# Patient Record
Sex: Male | Born: 1955 | Race: Black or African American | Hispanic: No | Marital: Married | Smoking: Never smoker
Health system: Southern US, Community
[De-identification: ages and names within clinical notes are randomized; demographics above are authoritative.]

## PROBLEM LIST (undated history)

## (undated) DIAGNOSIS — N529 Male erectile dysfunction, unspecified: Secondary | ICD-10-CM

## (undated) DIAGNOSIS — N3281 Overactive bladder: Secondary | ICD-10-CM

## (undated) DIAGNOSIS — D649 Anemia, unspecified: Secondary | ICD-10-CM

## (undated) DIAGNOSIS — N400 Enlarged prostate without lower urinary tract symptoms: Secondary | ICD-10-CM

## (undated) DIAGNOSIS — R7303 Prediabetes: Secondary | ICD-10-CM

## (undated) DIAGNOSIS — I10 Essential (primary) hypertension: Secondary | ICD-10-CM

## (undated) DIAGNOSIS — G4730 Sleep apnea, unspecified: Secondary | ICD-10-CM

## (undated) DIAGNOSIS — E291 Testicular hypofunction: Secondary | ICD-10-CM

## (undated) DIAGNOSIS — R7309 Other abnormal glucose: Secondary | ICD-10-CM

## (undated) DIAGNOSIS — E119 Type 2 diabetes mellitus without complications: Secondary | ICD-10-CM

## (undated) DIAGNOSIS — E785 Hyperlipidemia, unspecified: Secondary | ICD-10-CM

## (undated) DIAGNOSIS — E559 Vitamin D deficiency, unspecified: Secondary | ICD-10-CM

## (undated) DIAGNOSIS — N183 Chronic kidney disease, stage 3 unspecified: Secondary | ICD-10-CM

## (undated) HISTORY — DX: Testicular hypofunction: E29.1

## (undated) HISTORY — DX: Chronic kidney disease, stage 3 unspecified: N18.30

## (undated) HISTORY — DX: Prediabetes: R73.03

## (undated) HISTORY — PX: POLYPECTOMY: SHX149

## (undated) HISTORY — DX: Hyperlipidemia, unspecified: E78.5

## (undated) HISTORY — DX: Anemia, unspecified: D64.9

## (undated) HISTORY — DX: Essential (primary) hypertension: I10

## (undated) HISTORY — PX: COLONOSCOPY: SHX174

## (undated) HISTORY — DX: Sleep apnea, unspecified: G47.30

## (undated) HISTORY — DX: Male erectile dysfunction, unspecified: N52.9

## (undated) HISTORY — DX: Type 2 diabetes mellitus without complications: E11.9

## (undated) HISTORY — PX: JOINT REPLACEMENT: SHX530

## (undated) HISTORY — DX: Vitamin D deficiency, unspecified: E55.9

## (undated) HISTORY — DX: Other abnormal glucose: R73.09

## (undated) HISTORY — DX: Overactive bladder: N32.81

---

## 2005-11-09 ENCOUNTER — Ambulatory Visit: Payer: Self-pay | Admitting: Gastroenterology

## 2005-11-26 ENCOUNTER — Encounter: Payer: Self-pay | Admitting: Gastroenterology

## 2005-11-26 ENCOUNTER — Ambulatory Visit: Payer: Self-pay | Admitting: Gastroenterology

## 2008-10-13 ENCOUNTER — Encounter (INDEPENDENT_AMBULATORY_CARE_PROVIDER_SITE_OTHER): Payer: Self-pay | Admitting: *Deleted

## 2008-11-22 ENCOUNTER — Ambulatory Visit: Payer: Self-pay | Admitting: Gastroenterology

## 2008-12-03 ENCOUNTER — Ambulatory Visit: Payer: Self-pay | Admitting: Gastroenterology

## 2009-02-15 ENCOUNTER — Ambulatory Visit (HOSPITAL_COMMUNITY): Admission: RE | Admit: 2009-02-15 | Discharge: 2009-02-15 | Payer: Self-pay | Admitting: Internal Medicine

## 2010-10-26 IMAGING — CR DG SHOULDER 2+V*L*
3 series · 3 of 3 positions shown · non-contrast
Comparison: None

CLINICAL DATA: Left shoulder pain

LEFT SHOULDER - 2+ VIEW

[view not recorded (1 of 3)]
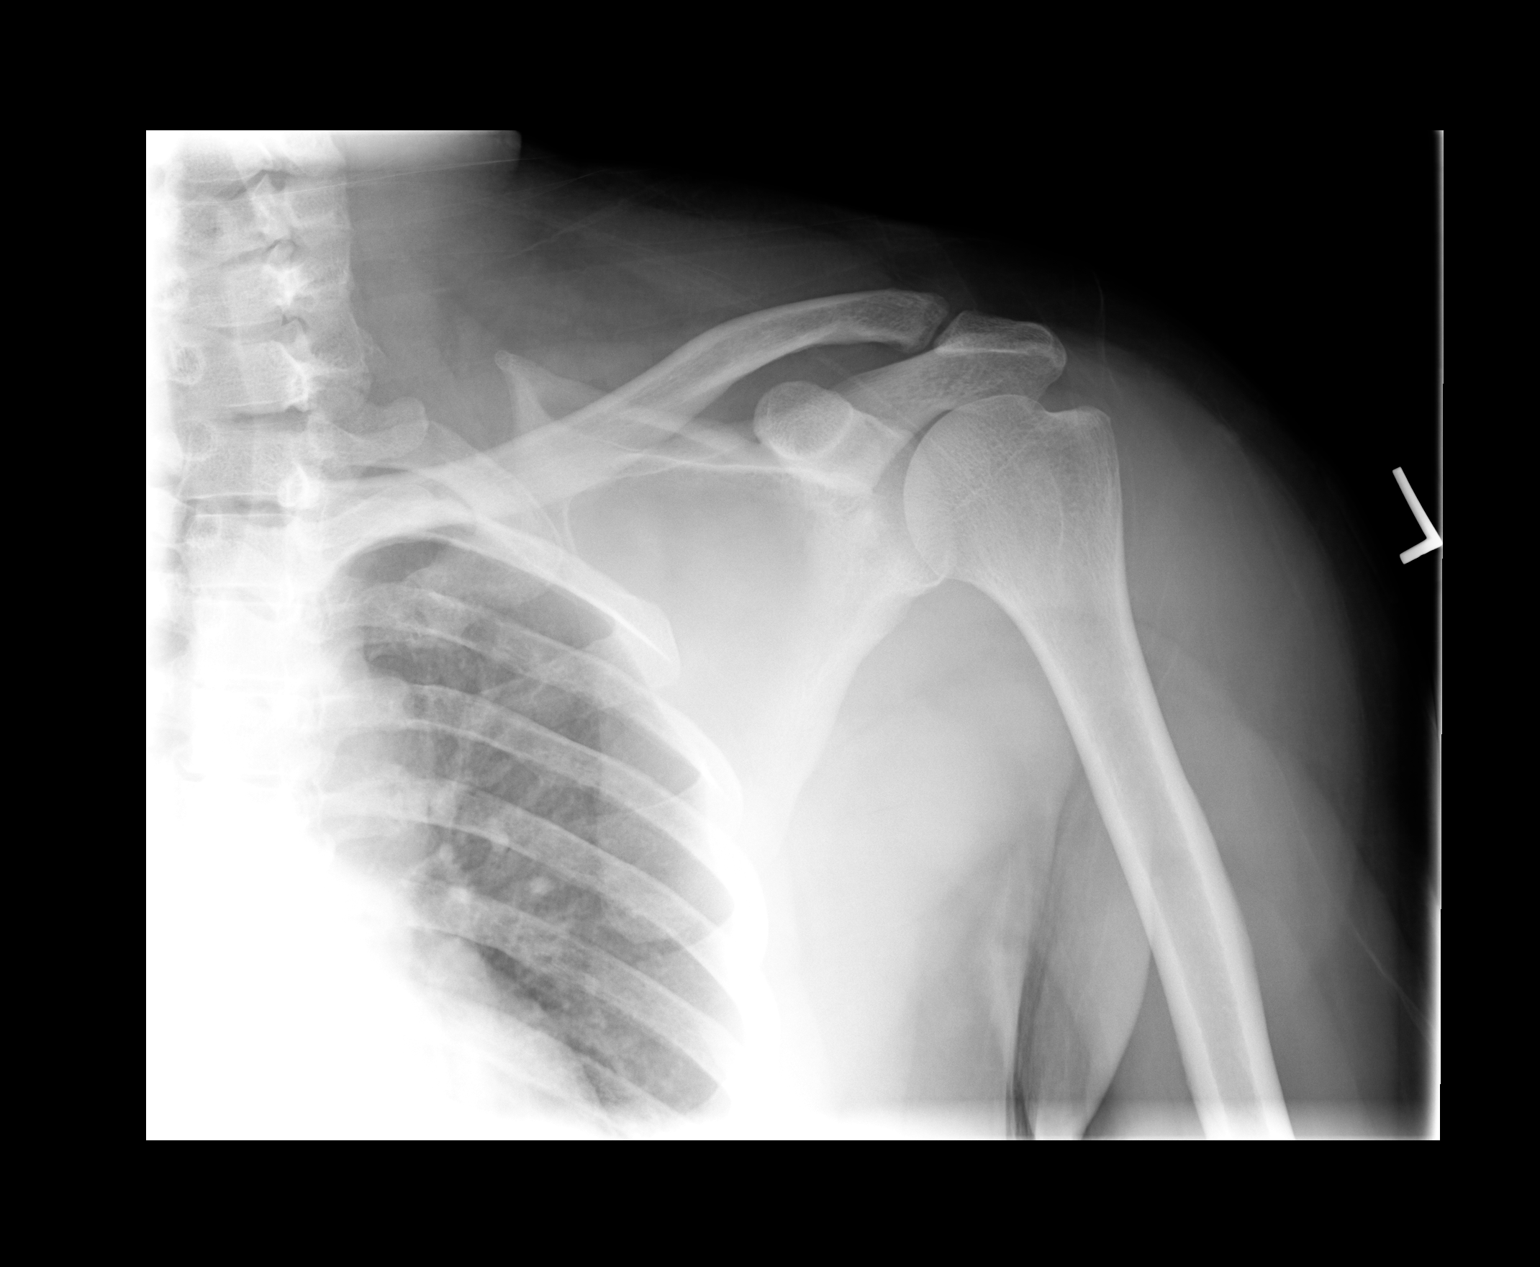

[view not recorded (2 of 3)]
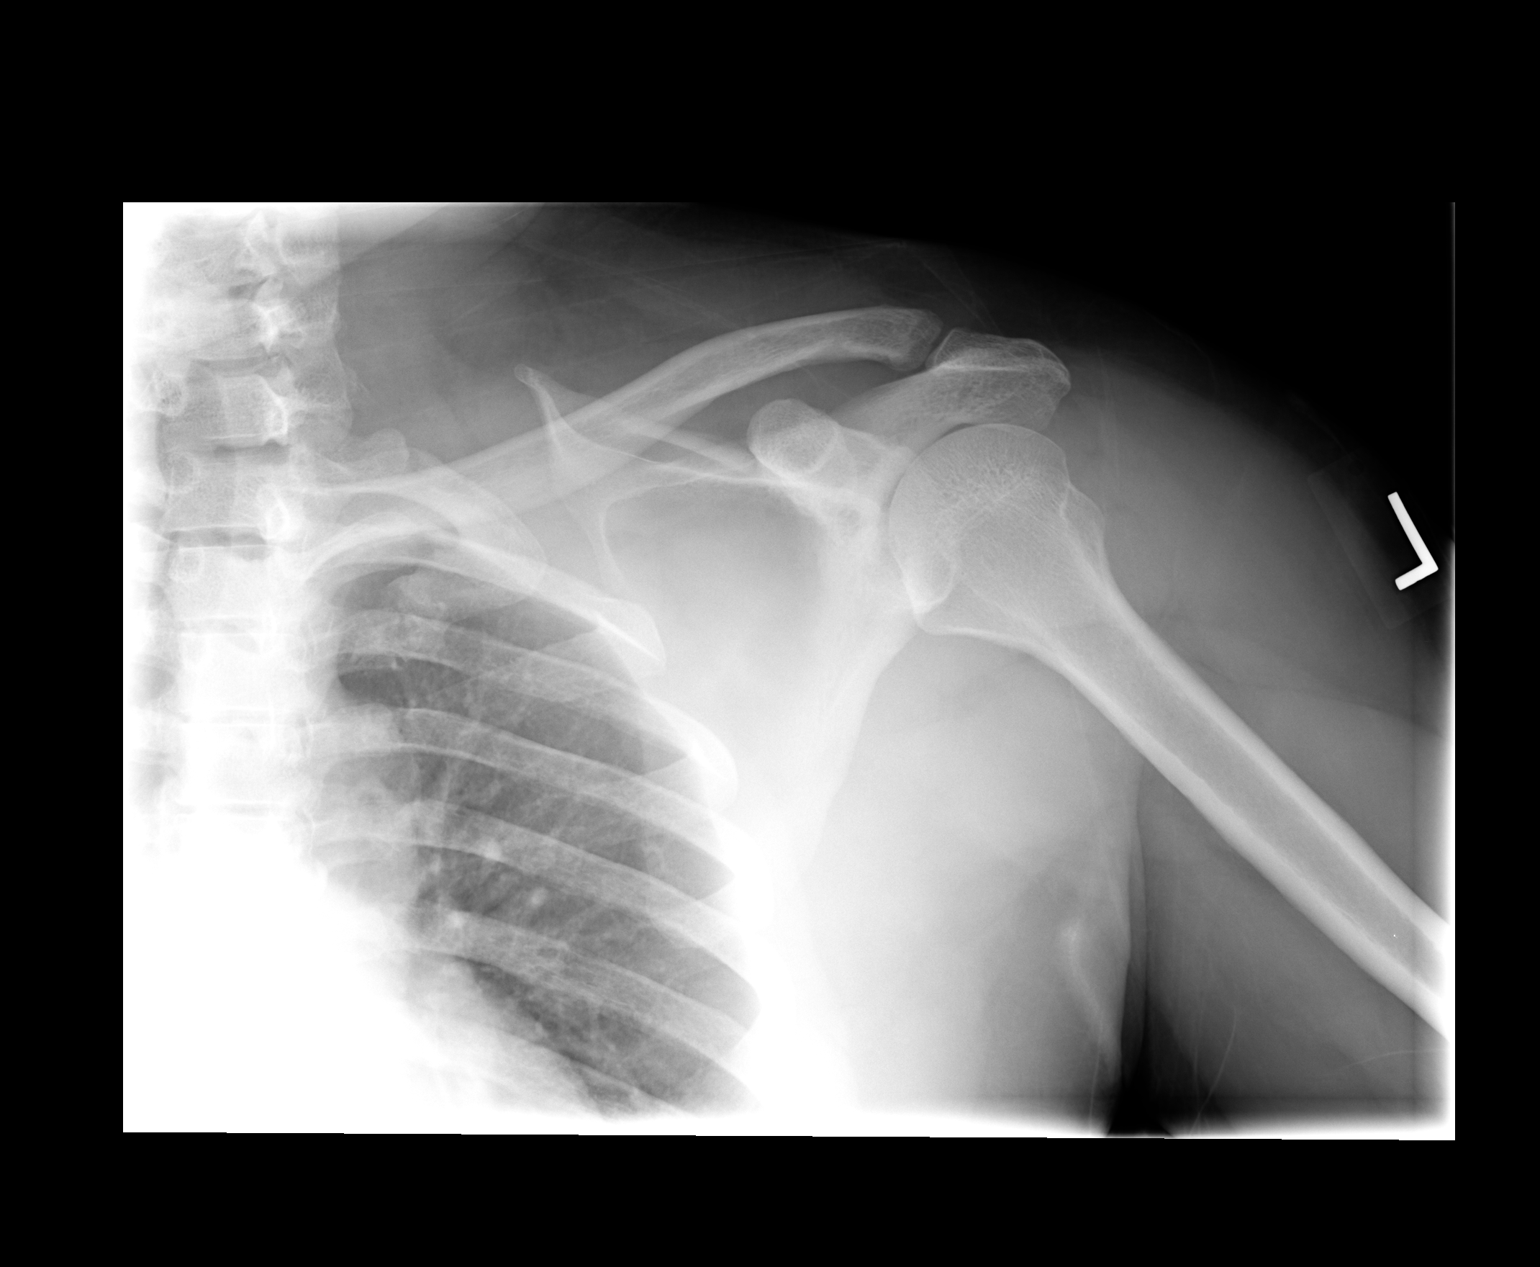

[view not recorded (3 of 3)]
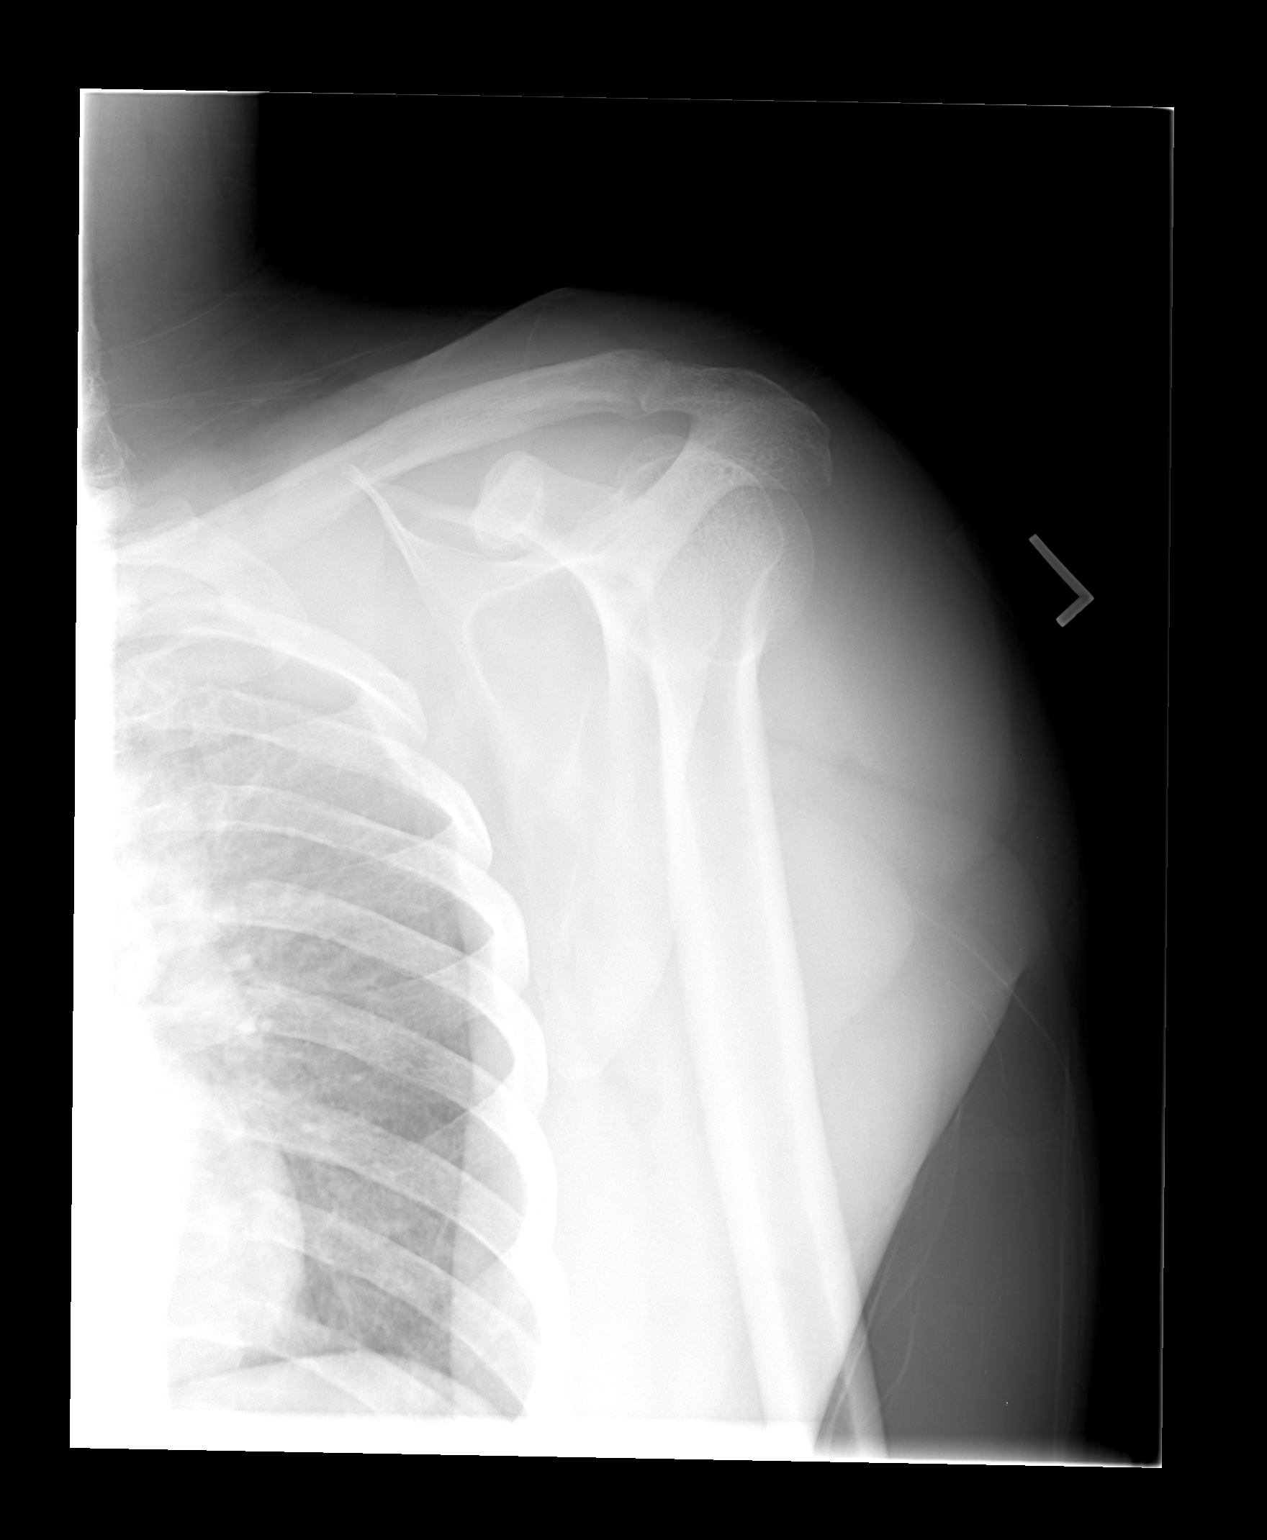

[3 of 3 positions shown; findings below may reference images not displayed]

FINDINGS: The AC joint is intact and the subacromial space is
maintained.  There is no evidence of fracture or dislocation.
IMPRESSION: Normal left shoulder.  If there is clinical concern regarding a
rotator cuff injury, MRI may be of help.

## 2013-02-12 ENCOUNTER — Encounter: Payer: Self-pay | Admitting: Internal Medicine

## 2013-02-12 DIAGNOSIS — E782 Mixed hyperlipidemia: Secondary | ICD-10-CM

## 2013-02-12 DIAGNOSIS — I10 Essential (primary) hypertension: Secondary | ICD-10-CM

## 2013-02-12 DIAGNOSIS — E1169 Type 2 diabetes mellitus with other specified complication: Secondary | ICD-10-CM | POA: Insufficient documentation

## 2013-02-13 ENCOUNTER — Ambulatory Visit: Payer: Self-pay | Admitting: Emergency Medicine

## 2013-02-16 ENCOUNTER — Other Ambulatory Visit: Payer: Self-pay | Admitting: Internal Medicine

## 2013-04-02 HISTORY — PX: COLONOSCOPY: SHX174

## 2013-05-06 ENCOUNTER — Encounter: Payer: Self-pay | Admitting: Internal Medicine

## 2013-05-06 ENCOUNTER — Ambulatory Visit (INDEPENDENT_AMBULATORY_CARE_PROVIDER_SITE_OTHER): Payer: 59 | Admitting: Internal Medicine

## 2013-05-06 VITALS — BP 142/88 | HR 76 | Temp 98.2°F | Resp 16 | Ht 68.75 in | Wt 247.4 lb

## 2013-05-06 DIAGNOSIS — Z79899 Other long term (current) drug therapy: Secondary | ICD-10-CM

## 2013-05-06 DIAGNOSIS — E782 Mixed hyperlipidemia: Secondary | ICD-10-CM

## 2013-05-06 DIAGNOSIS — Z113 Encounter for screening for infections with a predominantly sexual mode of transmission: Secondary | ICD-10-CM

## 2013-05-06 DIAGNOSIS — R7303 Prediabetes: Secondary | ICD-10-CM | POA: Insufficient documentation

## 2013-05-06 DIAGNOSIS — Z1212 Encounter for screening for malignant neoplasm of rectum: Secondary | ICD-10-CM

## 2013-05-06 DIAGNOSIS — R740 Nonspecific elevation of levels of transaminase and lactic acid dehydrogenase [LDH]: Secondary | ICD-10-CM

## 2013-05-06 DIAGNOSIS — Z125 Encounter for screening for malignant neoplasm of prostate: Secondary | ICD-10-CM

## 2013-05-06 DIAGNOSIS — Z0000 Encounter for general adult medical examination without abnormal findings: Secondary | ICD-10-CM

## 2013-05-06 DIAGNOSIS — R7309 Other abnormal glucose: Secondary | ICD-10-CM

## 2013-05-06 DIAGNOSIS — I10 Essential (primary) hypertension: Secondary | ICD-10-CM

## 2013-05-06 DIAGNOSIS — E559 Vitamin D deficiency, unspecified: Secondary | ICD-10-CM

## 2013-05-06 DIAGNOSIS — R7401 Elevation of levels of liver transaminase levels: Secondary | ICD-10-CM

## 2013-05-06 DIAGNOSIS — Z111 Encounter for screening for respiratory tuberculosis: Secondary | ICD-10-CM

## 2013-05-06 LAB — CBC WITH DIFFERENTIAL/PLATELET
BASOS PCT: 1 % (ref 0–1)
Basophils Absolute: 0 10*3/uL (ref 0.0–0.1)
Eosinophils Absolute: 0.2 10*3/uL (ref 0.0–0.7)
Eosinophils Relative: 2 % (ref 0–5)
HCT: 40.5 % (ref 39.0–52.0)
HEMOGLOBIN: 13.5 g/dL (ref 13.0–17.0)
LYMPHS ABS: 3 10*3/uL (ref 0.7–4.0)
LYMPHS PCT: 45 % (ref 12–46)
MCH: 29.2 pg (ref 26.0–34.0)
MCHC: 33.3 g/dL (ref 30.0–36.0)
MCV: 87.5 fL (ref 78.0–100.0)
MONOS PCT: 6 % (ref 3–12)
Monocytes Absolute: 0.4 10*3/uL (ref 0.1–1.0)
NEUTROS ABS: 3 10*3/uL (ref 1.7–7.7)
NEUTROS PCT: 46 % (ref 43–77)
Platelets: 205 10*3/uL (ref 150–400)
RBC: 4.63 MIL/uL (ref 4.22–5.81)
RDW: 14.5 % (ref 11.5–15.5)
WBC: 6.6 10*3/uL (ref 4.0–10.5)

## 2013-05-06 LAB — HEMOGLOBIN A1C
Hgb A1c MFr Bld: 6.4 % — ABNORMAL HIGH (ref <5.7)
Mean Plasma Glucose: 137 mg/dL — ABNORMAL HIGH (ref <117)

## 2013-05-06 MED ORDER — PHENTERMINE HCL 37.5 MG PO TABS: ORAL_TABLET | ORAL | Status: DC

## 2013-05-06 MED ORDER — HYDROCORTISONE 2.5 % RE CREA: TOPICAL_CREAM | RECTAL | Status: DC

## 2013-05-06 NOTE — Progress Notes (Signed)
Patient ID: Gabriel Livingston, male   DOB: Aug 14, 1955, 58 y.o.   MRN: [id number]  Annual Screening Comprehensive Examination  This very nice 58 y.o.  MBM presents for complete physical.  Patient has been followed for HTN, Prediabetes, Hyperlipidemia, and Vitamin D Deficiency.   HTN predates since 2005. Patient's BP has been controlled at home.Today's BP: 142/88 mmHg. Patient denies any cardiac symptoms as chest pain, palpitations, shortness of breath, dizziness or ankle swelling.   Patient's hyperlipidemia is controlled with diet and medications. Patient denies myalgias or other medication SE's. Last cholesterol last visit was 168, triglycerides 79, HDL 42 and LDL 110 - not at goal..     Patient has prediabetes with A1c 6.4% in Feb 2011  with last A1c  5.8% in Aug 2014. Patient denies reactive hypoglycemic symptoms, visual blurring, diabetic polys, or paresthesias.    Finally, patient has history of Vitamin D Deficiency of 19 in 2008 with last vitamin D 89 in Aug 2014       Medication List       atenolol 100 MG tablet  Commonly known as:  TENORMIN  Take 100 mg by mouth daily. 1/2 qd     hydrocortisone 2.5 % rectal cream  Commonly known as:  ANUSOL-HC  Apply rectally 2 to 4 x daily     multivitamin with minerals tablet  Take 1 tablet by mouth daily.     phentermine 37.5 MG tablet  Commonly known as:  ADIPEX-P  1/2 to 1 tablet every morning for weight loss     Vitamin D (Ergocalciferol) 50000 UNITS Caps capsule  Commonly known as:  DRISDOL  Take 50,000 Units by mouth. 6 days a week        No Known Allergies  Past Medical History  Diagnosis Date  . Hypertension   . Hyperlipidemia   . Other abnormal glucose   . Hypogonadism male   . Unspecified vitamin D deficiency     No past surgical history on file.  Family History  Problem Relation Age of Onset  . Heart disease Mother   . Hyperlipidemia Mother   . Hypertension Mother   . Cancer Father     lung, prostate     History   Social History  . Marital Status: Married    Spouse Name: N/A    Number of Children: N/A  . Years of Education: N/A   Occupational History  . Not on file.   Social History Main Topics  . Smoking status: Never Smoker   . Smokeless tobacco: Never Used  . Alcohol Use: No  . Drug Use: No  . Sexual Activity: Not on file   Other Topics Concern  . Not on file   Social History Narrative  . No narrative on file    ROS Constitutional: Denies fever, chills, weight loss/gain, headaches, insomnia, fatigue, night sweats, and change in appetite. Eyes: Denies redness, blurred vision, diplopia, discharge, itchy, watery eyes.  ENT: Denies discharge, congestion, post nasal drip, epistaxis, sore throat, earache, hearing loss, dental pain, Tinnitus, Vertigo, Sinus pain, snoring.  Cardio: Denies chest pain, palpitations, irregular heartbeat, syncope, dyspnea, diaphoresis, orthopnea, PND, claudication, edema Respiratory: denies cough, dyspnea, DOE, pleurisy, hoarseness, laryngitis, wheezing.  Gastrointestinal: Denies dysphagia, heartburn, reflux, water brash, pain, cramps, nausea, vomiting, bloating, diarrhea, constipation, hematemesis, melena, hematochezia, jaundice, hemorrhoids Genitourinary: Denies dysuria, frequency, urgency, nocturia, hesitancy, discharge, hematuria, flank pain Musculoskeletal: Denies arthralgia, myalgia, stiffness, Jt. Swelling, pain, limp, and strain/sprain. Skin: Denies puritis, rash, hives, warts, acne, eczema,  changing in skin lesion Neuro: No weakness, tremor, incoordination, spasms, paresthesia, pain Psychiatric: Denies confusion, memory loss, sensory loss Endocrine: Denies change in weight, skin, hair change, nocturia, and paresthesia, diabetic polys, visual blurring, hyper / hypo glycemic episodes.  Heme/Lymph: No excessive bleeding, bruising, or elarged lymph nodes.  BP: 142/88  Pulse: 76  Temp: 98.2 F (36.8 C)  Resp: 16    Estimated body mass  index is 36.81 kg/(m^2) as calculated from the following:   Height as of this encounter: 5' 8.75" (1.746 m).   Weight as of this encounter: 247 lb 6.4 oz (112.22 kg).  Physical Exam General Appearance: Well nourished, in no apparent distress. Eyes: PERRLA, EOMs, conjunctiva no swelling or erythema, normal fundi and vessels. Sinuses: No frontal/maxillary tenderness ENT/Mouth: EACs patent / TMs  nl. Nares clear without erythema, swelling, mucoid exudates. Oral hygiene is good. No erythema, swelling, or exudate. Tongue normal, non-obstructing. Tonsils not swollen or erythematous. Hearing normal.  Neck: Supple, thyroid normal. No bruits, nodes or JVD. Respiratory: Respiratory effort normal.  BS equal and clear bilateral without rales, rhonci, wheezing or stridor. Cardio: Heart sounds are normal with regular rate and rhythm and no murmurs, rubs or gallops. Peripheral pulses are normal and equal bilaterally without edema. No aortic or femoral bruits. Chest: symmetric with normal excursions and percussion.  Abdomen: Flat, soft, with bowl sounds. Nontender, no guarding, rebound, hernias, masses, or organomegaly.  Lymphatics: Non tender without lymphadenopathy.  Genitourinary: No hernias.Testes nl. DRE - prostate nl for age - smooth & firm w/o nodules. Musculoskeletal: Full ROM all peripheral extremities, joint stability, 5/5 strength, and normal gait. Skin: Warm and dry without rashes, lesions, cyanosis, clubbing or  ecchymosis.  Neuro: Cranial nerves intact, reflexes equal bilaterally. Normal muscle tone, no cerebellar symptoms. Sensation intact.  Pysch: Awake and oriented X 3, normal affect, insight and judgment appropriate.   Assessment and Plan  1. Annual Screening Examination 2. Hypertension  3. Hyperlipidemia 4. Pre Diabetes 5. Vitamin D Deficiency 6. Morbid obesity   Continue prudent diet as discussed, weight control, BP monitoring, regular exercise, and medications as discussed.   Discussed med effects and SE's. Routine screening labs and tests as requested with regular follow-up as recommended.

## 2013-05-06 NOTE — Patient Instructions (Addendum)
Hemorrhoids Hemorrhoids are swollen veins around the rectum or anus. There are two types of hemorrhoids:   Internal hemorrhoids. These occur in the veins just inside the rectum. They may poke through to the outside and become irritated and painful.  External hemorrhoids. These occur in the veins outside the anus and can be felt as a painful swelling or hard lump near the anus. CAUSES  Pregnancy.   Obesity.   Constipation or diarrhea.   Straining to have a bowel movement.   Sitting for long periods on the toilet.  Heavy lifting or other activity that caused you to strain.  Anal intercourse. SYMPTOMS   Pain.   Anal itching or irritation.   Rectal bleeding.   Fecal leakage.   Anal swelling.   One or more lumps around the anus.  DIAGNOSIS  Your caregiver may be able to diagnose hemorrhoids by visual examination. Other examinations or tests that may be performed include:   Examination of the rectal area with a gloved hand (digital rectal exam).   Examination of anal canal using a small tube (scope).   A blood test if you have lost a significant amount of blood.  A test to look inside the colon (sigmoidoscopy or colonoscopy). TREATMENT Most hemorrhoids can be treated at home. However, if symptoms do not seem to be getting better or if you have a lot of rectal bleeding, your caregiver may perform a procedure to help make the hemorrhoids get smaller or remove them completely. Possible treatments include:   Placing a rubber band at the base of the hemorrhoid to cut off the circulation (rubber band ligation).   Injecting a chemical to shrink the hemorrhoid (sclerotherapy).   Using a tool to burn the hemorrhoid (infrared light therapy).   Surgically removing the hemorrhoid (hemorrhoidectomy).   Stapling the hemorrhoid to block blood flow to the tissue (hemorrhoid stapling).  HOME CARE INSTRUCTIONS   Eat foods with fiber, such as whole grains, beans,  nuts, fruits, and vegetables. Ask your doctor about taking products with added fiber in them (fibersupplements).  Increase fluid intake. Drink enough water and fluids to keep your urine clear or pale yellow.   Exercise regularly.   Go to the bathroom when you have the urge to have a bowel movement. Do not wait.   Avoid straining to have bowel movements.   Keep the anal area dry and clean. Use wet toilet paper or moist towelettes after a bowel movement.   Medicated creams and suppositories may be used or applied as directed.   Only take over-the-counter or prescription medicines as directed by your caregiver.   Take warm sitz baths for 15 20 minutes, 3 4 times a day to ease pain and discomfort.   Place ice packs on the hemorrhoids if they are tender and swollen. Using ice packs between sitz baths may be helpful.   Put ice in a plastic bag.   Place a towel between your skin and the bag.   Leave the ice on for 15 20 minutes, 3 4 times a day.   Do not use a donut-shaped pillow or sit on the toilet for long periods. This increases blood pooling and pain.  SEEK MEDICAL CARE IF:  You have increasing pain and swelling that is not controlled by treatment or medicine.  You have uncontrolled bleeding.  You have difficulty or you are unable to have a bowel movement.  You have pain or inflammation outside the area of the hemorrhoids. MAKE SURE YOU:  Understand these instructions.  Will watch your condition.  Will get help right away if you are not doing well or get worse. Document Released: 03/16/2000 Document Revised: 03/05/2012 Document Reviewed: 01/22/2012 [facility] Patient Information 2014 [place].   Hemorrhoids Hemorrhoids are puffy (swollen) veins around the rectum or anus. Hemorrhoids can cause pain, itching, bleeding, or irritation. HOME CARE  Eat foods with fiber, such as whole grains, beans, nuts, fruits, and vegetables. Ask your doctor about  taking products with added fiber in them (fibersupplements).  Drink enough fluid to keep your pee (urine) clear or pale yellow.  Exercise often.  Go to the bathroom when you have the urge to poop. Do not wait.  Avoid straining to poop (bowel movement).  Keep the butt area dry and clean. Use wet toilet paper or moist paper towels.  Medicated creams and medicine inserted into the anus (anal suppository) may be used or applied as told.  Only take medicine as told by your doctor.  Take a warm water bath (sitz bath) for 15 20 minutes to ease pain. Do this 3 4 times a day.  Place ice packs on the area if it is tender or puffy. Use the ice packs between the warm water baths.  Put ice in a plastic bag.  Place a towel between your skin and the bag.  Leave the ice on for 15-20 minutes, 03-04 times a day.  Do not use a donut-shaped pillow or sit on the toilet for a long time. GET HELP RIGHT AWAY IF:   You have more pain that is not controlled by treatment or medicine.  You have bleeding that will not stop.  You have trouble or are unable to poop (bowel movement).  You have pain or puffiness outside the area of the hemorrhoids. MAKE SURE YOU:   Understand these instructions.  Will watch your condition.  Will get help right away if you are not doing well or get worse. Document Released: 12/27/2007 Document Revised: 03/05/2012 Document Reviewed: 01/29/2012 [facility] Patient Information 2014 [place], [place].  Hypertension As your heart beats, it forces blood through your arteries. This force is your blood pressure. If the pressure is too high, it is called hypertension (HTN) or high blood pressure. HTN is dangerous because you may have it and not know it. High blood pressure may mean that your heart has to work harder to pump blood. Your arteries may be narrow or stiff. The extra work puts you at risk for heart disease, stroke, and other problems.  Blood pressure consists of two  numbers, a higher number over a lower, 110/72, for example. It is stated as "110 over 72." The ideal is below 120 for the top number (systolic) and under 80 for the bottom (diastolic). Write down your blood pressure today. You should pay close attention to your blood pressure if you have certain conditions such as:  Heart failure.  Prior heart attack.  Diabetes  Chronic kidney disease.  Prior stroke.  Multiple risk factors for heart disease. To see if you have HTN, your blood pressure should be measured while you are seated with your arm held at the level of the heart. It should be measured at least twice. A one-time elevated blood pressure reading (especially in the Emergency Department) does not mean that you need treatment. There may be conditions in which the blood pressure is different between your right and left arms. It is important to see your caregiver soon for a recheck. Most people have  essential hypertension which means that there is not a specific cause. This type of high blood pressure may be lowered by changing lifestyle factors such as:  Stress.  Smoking.  Lack of exercise.  Excessive weight.  Drug/tobacco/alcohol use.  Eating less salt. Most people do not have symptoms from high blood pressure until it has caused damage to the body. Effective treatment can often prevent, delay or reduce that damage. TREATMENT  When a cause has been identified, treatment for high blood pressure is directed at the cause. There are a large number of medications to treat HTN. These fall into several categories, and your caregiver will help you select the medicines that are best for you. Medications may have side effects. You should review side effects with your caregiver. If your blood pressure stays high after you have made lifestyle changes or started on medicines,   Your medication(s) may need to be changed.  Other problems may need to be addressed.  Be certain you understand your  prescriptions, and know how and when to take your medicine.  Be sure to follow up with your caregiver within the time frame advised (usually within two weeks) to have your blood pressure rechecked and to review your medications.  If you are taking more than one medicine to lower your blood pressure, make sure you know how and at what times they should be taken. Taking two medicines at the same time can result in blood pressure that is too low. SEEK IMMEDIATE MEDICAL CARE IF:  You develop a severe headache, blurred or changing vision, or confusion.  You have unusual weakness or numbness, or a faint feeling.  You have severe chest or abdominal pain, vomiting, or breathing problems. MAKE SURE YOU:   Understand these instructions.  Will watch your condition.  Will get help right away if you are not doing well or get worse.   Diabetes and Exercise Exercising regularly is important. It is not just about losing weight. It has many health benefits, such as:  Improving your overall fitness, flexibility, and endurance.  Increasing your bone density.  Helping with weight control.  Decreasing your body fat.  Increasing your muscle strength.  Reducing stress and tension.  Improving your overall health. People with diabetes who exercise gain additional benefits because exercise:  Reduces appetite.  Improves the body's use of blood sugar (glucose).  Helps lower or control blood glucose.  Decreases blood pressure.  Helps control blood lipids (such as cholesterol and triglycerides).  Improves the body's use of the hormone insulin by:  Increasing the body's insulin sensitivity.  Reducing the body's insulin needs.  Decreases the risk for heart disease because exercising:  Lowers cholesterol and triglycerides levels.  Increases the levels of good cholesterol (such as high-density lipoproteins [HDL]) in the body.  Lowers blood glucose levels. YOUR ACTIVITY PLAN  Choose an  activity that you enjoy and set realistic goals. Your health care provider or diabetes educator can help you make an activity plan that works for you. You can break activities into 2 or 3 sessions throughout the day. Doing so is as good as one long session. Exercise ideas include:  Taking the dog for a walk.  Taking the stairs instead of the elevator.  Dancing to your favorite song.  Doing your favorite exercise with a friend. RECOMMENDATIONS FOR EXERCISING WITH TYPE 1 OR TYPE 2 DIABETES   Check your blood glucose before exercising. If blood glucose levels are greater than 240 mg/dL, check for urine ketones.  Do not exercise if ketones are present.  Avoid injecting insulin into areas of the body that are going to be exercised. For example, avoid injecting insulin into:  The arms when playing tennis.  The legs when jogging.  Keep a record of:  Food intake before and after you exercise.  Expected peak times of insulin action.  Blood glucose levels before and after you exercise.  The type and amount of exercise you have done.  Review your records with your health care provider. Your health care provider will help you to develop guidelines for adjusting food intake and insulin amounts before and after exercising.  If you take insulin or oral hypoglycemic agents, watch for signs and symptoms of hypoglycemia. They include:  Dizziness.  Shaking.  Sweating.  Chills.  Confusion.  Drink plenty of water while you exercise to prevent dehydration or heat stroke. Body water is lost during exercise and must be replaced.  Talk to your health care provider before starting an exercise program to make sure it is safe for you. Remember, almost any type of activity is better than none.    Cholesterol Cholesterol is a white, waxy, fat-like protein needed by your body in small amounts. The liver makes all the cholesterol you need. It is carried from the liver by the blood through the blood  vessels. Deposits (plaque) may build up on blood vessel walls. This makes the arteries narrower and stiffer. Plaque increases the risk for heart attack and stroke. You cannot feel your cholesterol level even if it is very high. The only way to know is by a blood test to check your lipid (fats) levels. Once you know your cholesterol levels, you should keep a record of the test results. Work with your caregiver to to keep your levels in the desired range. WHAT THE RESULTS MEAN:  Total cholesterol is a rough measure of all the cholesterol in your blood.  LDL is the so-called bad cholesterol. This is the type that deposits cholesterol in the walls of the arteries. You want this level to be low.  HDL is the good cholesterol because it cleans the arteries and carries the LDL away. You want this level to be high.  Triglycerides are fat that the body can either burn for energy or store. High levels are closely linked to heart disease. DESIRED LEVELS:  Total cholesterol below 200.  LDL below 100 for people at risk, below 70 for very high risk.  HDL above 50 is good, above 60 is best.  Triglycerides below 150. HOW TO LOWER YOUR CHOLESTEROL:  Diet.  Choose fish or white meat chicken and [place], roasted or baked. Limit fatty cuts of red meat, fried foods, and processed meats, such as sausage and lunch meat.  Eat lots of fresh fruits and vegetables. Choose whole grains, beans, pasta, potatoes and cereals.  Use only small amounts of olive, corn or canola oils. Avoid butter, mayonnaise, shortening or palm kernel oils. Avoid foods with trans-fats.  Use skim/nonfat milk and low-fat/nonfat yogurt and cheeses. Avoid whole milk, cream, ice cream, egg yolks and cheeses. Healthy desserts include angel food cake, ginger snaps, animal crackers, hard candy, popsicles, and low-fat/nonfat frozen yogurt. Avoid pastries, cakes, pies and cookies.  Exercise.  A regular program helps decrease LDL and raises  HDL.  Helps with weight control.  Do things that increase your activity level like gardening, walking, or taking the stairs.  Medication.  May be prescribed by your caregiver to help lowering cholesterol and  the risk for heart disease.  You may need medicine even if your levels are normal if you have several risk factors. HOME CARE INSTRUCTIONS   Follow your diet and exercise programs as suggested by your caregiver.  Take medications as directed.  Have blood work done when your caregiver feels it is necessary. MAKE SURE YOU:   Understand these instructions.  Will watch your condition.  Will get help right away if you are not doing well or get worse.      Vitamin D Deficiency Vitamin D is an important vitamin that your body needs. Having too little of it in your body is called a deficiency. A very bad deficiency can make your bones soft and can cause a condition called rickets.  Vitamin D is important to your body for different reasons, such as:   It helps your body absorb 2 minerals called calcium and phosphorus.  It helps make your bones healthy.  It may prevent some diseases, such as diabetes and multiple sclerosis.  It helps your muscles and heart. You can get vitamin D in several ways. It is a natural part of some foods. The vitamin is also added to some dairy products and cereals. Some people take vitamin D supplements. Also, your body makes vitamin D when you are in the sun. It changes the sun's rays into a form of the vitamin that your body can use. CAUSES   Not eating enough foods that contain vitamin D.  Not getting enough sunlight.  Having certain digestive system diseases that make it hard to absorb vitamin D. These diseases include Crohn's disease, chronic pancreatitis, and cystic fibrosis.  Having a surgery in which part of the stomach or small intestine is removed.  Being obese. Fat cells pull vitamin D out of your blood. That means that obese people  may not have enough vitamin D left in their blood and in other body tissues.  Having chronic kidney or liver disease. RISK FACTORS Risk factors are things that make you more likely to develop a vitamin D deficiency. They include:  Being older.  Not being able to get outside very much.  Living in a nursing home.  Having had broken bones.  Having weak or thin bones (osteoporosis).  Having a disease or condition that changes how your body absorbs vitamin D.  Having dark skin.  Some medicines such as seizure medicines or steroids.  Being overweight or obese. SYMPTOMS Mild cases of vitamin D deficiency may not have any symptoms. If you have a very bad case, symptoms may include:  Bone pain.  Muscle pain.  Falling often.  Broken bones caused by a minor injury, due to osteoporosis. DIAGNOSIS A blood test is the best way to tell if you have a vitamin D deficiency. TREATMENT Vitamin D deficiency can be treated in different ways. Treatment for vitamin D deficiency depends on what is causing it. Options include:  Taking vitamin D supplements.  Taking a calcium supplement. Your caregiver will suggest what dose is best for you. HOME CARE INSTRUCTIONS  Take any supplements that your caregiver prescribes. Follow the directions carefully. Take only the suggested amount.  Have your blood tested 2 months after you start taking supplements.  Eat foods that contain vitamin D. Healthy choices include:  Fortified dairy products, cereals, or juices. Fortified means vitamin D has been added to the food. Check the label on the package to be sure.  Fatty fish like salmon or trout.  Eggs.  Oysters.  Do not use a tanning bed.  Keep your weight at a healthy level. Lose weight if you need to.  Keep all follow-up appointments. Your caregiver will need to perform blood tests to make sure your vitamin D deficiency is going away. SEEK MEDICAL CARE IF:  You have any questions about your  treatment.  You continue to have symptoms of vitamin D deficiency.  You have nausea or vomiting.  You are constipated.  You feel confused.  You have severe abdominal or back pain. MAKE SURE YOU:  Understand these instructions.  Will watch your condition.  Will get help right away if you are not doing well or get worse.

## 2013-05-07 LAB — INSULIN, FASTING: Insulin fasting, serum: 59 u[IU]/mL — ABNORMAL HIGH (ref 3–28)

## 2013-05-07 LAB — LIPID PANEL
Cholesterol: 152 mg/dL (ref 0–200)
HDL: 40 mg/dL (ref >39)
LDL CALC: 91 mg/dL (ref 0–99)
TRIGLYCERIDES: 103 mg/dL (ref <150)
Total CHOL/HDL Ratio: 3.8 Ratio
VLDL: 21 mg/dL (ref 0–40)

## 2013-05-07 LAB — HEPATITIS A ANTIBODY, TOTAL: Hep A Total Ab: REACTIVE — AB

## 2013-05-07 LAB — HEPATIC FUNCTION PANEL
ALBUMIN: 4 g/dL (ref 3.5–5.2)
ALT: 18 U/L (ref 0–53)
AST: 22 U/L (ref 0–37)
Alkaline Phosphatase: 49 U/L (ref 39–117)
Bilirubin, Direct: 0.1 mg/dL (ref 0.0–0.3)
Indirect Bilirubin: 0.5 mg/dL (ref 0.2–1.2)
TOTAL PROTEIN: 7.3 g/dL (ref 6.0–8.3)
Total Bilirubin: 0.6 mg/dL (ref 0.2–1.2)

## 2013-05-07 LAB — MICROALBUMIN / CREATININE URINE RATIO
CREATININE, URINE: 241.4 mg/dL
MICROALB/CREAT RATIO: 3.3 mg/g (ref 0.0–30.0)
Microalb, Ur: 0.79 mg/dL (ref 0.00–1.89)

## 2013-05-07 LAB — URINALYSIS, MICROSCOPIC ONLY
BACTERIA UA: NONE SEEN
CASTS: NONE SEEN
CRYSTALS: NONE SEEN
Squamous Epithelial / LPF: NONE SEEN

## 2013-05-07 LAB — BASIC METABOLIC PANEL WITH GFR
BUN: 17 mg/dL (ref 6–23)
CHLORIDE: 103 meq/L (ref 96–112)
CO2: 27 mEq/L (ref 19–32)
Calcium: 9.6 mg/dL (ref 8.4–10.5)
Creat: 1.24 mg/dL (ref 0.50–1.35)
GFR, EST AFRICAN AMERICAN: 74 mL/min
GFR, EST NON AFRICAN AMERICAN: 64 mL/min
Glucose, Bld: 93 mg/dL (ref 70–99)
POTASSIUM: 3.9 meq/L (ref 3.5–5.3)
SODIUM: 140 meq/L (ref 135–145)

## 2013-05-07 LAB — VITAMIN D 25 HYDROXY (VIT D DEFICIENCY, FRACTURES): Vit D, 25-Hydroxy: 87 ng/mL (ref 30–89)

## 2013-05-07 LAB — HEPATITIS B E ANTIBODY: HEPATITIS BE ANTIBODY: NEGATIVE

## 2013-05-07 LAB — HEPATITIS B SURFACE ANTIBODY,QUALITATIVE: HEP B S AB: NEGATIVE

## 2013-05-07 LAB — VITAMIN B12: Vitamin B-12: 697 pg/mL (ref 211–911)

## 2013-05-07 LAB — PSA: PSA: 1.56 ng/mL (ref <=4.00)

## 2013-05-07 LAB — HEPATITIS C ANTIBODY: HCV Ab: NEGATIVE

## 2013-05-07 LAB — TESTOSTERONE: TESTOSTERONE: 211 ng/dL — AB (ref 300–890)

## 2013-05-07 LAB — MAGNESIUM: MAGNESIUM: 1.8 mg/dL (ref 1.5–2.5)

## 2013-05-07 LAB — HIV ANTIBODY (ROUTINE TESTING W REFLEX): HIV: NONREACTIVE

## 2013-05-07 LAB — RPR

## 2013-05-07 LAB — TSH: TSH: 3.011 u[IU]/mL (ref 0.350–4.500)

## 2013-05-07 LAB — HEPATITIS B CORE ANTIBODY, TOTAL: Hep B Core Total Ab: NONREACTIVE

## 2013-05-08 LAB — TB SKIN TEST
INDURATION: 0 mm
TB SKIN TEST: NEGATIVE

## 2013-05-21 ENCOUNTER — Other Ambulatory Visit (INDEPENDENT_AMBULATORY_CARE_PROVIDER_SITE_OTHER): Payer: 59

## 2013-05-21 DIAGNOSIS — Z1212 Encounter for screening for malignant neoplasm of rectum: Secondary | ICD-10-CM

## 2013-05-21 LAB — POC HEMOCCULT BLD/STL (HOME/3-CARD/SCREEN)
Card #2 Fecal Occult Blod, POC: NEGATIVE
Card #3 Fecal Occult Blood, POC: NEGATIVE
Fecal Occult Blood, POC: NEGATIVE

## 2013-08-06 ENCOUNTER — Ambulatory Visit: Payer: Self-pay | Admitting: Physician Assistant

## 2013-09-17 ENCOUNTER — Encounter: Payer: Self-pay | Admitting: Gastroenterology

## 2013-10-30 ENCOUNTER — Encounter: Payer: Self-pay | Admitting: Gastroenterology

## 2013-11-05 ENCOUNTER — Encounter: Payer: Self-pay | Admitting: Internal Medicine

## 2013-11-05 ENCOUNTER — Ambulatory Visit (INDEPENDENT_AMBULATORY_CARE_PROVIDER_SITE_OTHER): Payer: 59 | Admitting: Internal Medicine

## 2013-11-05 VITALS — BP 114/82 | HR 76 | Temp 97.7°F | Resp 16 | Ht 69.0 in | Wt 223.8 lb

## 2013-11-05 DIAGNOSIS — E559 Vitamin D deficiency, unspecified: Secondary | ICD-10-CM

## 2013-11-05 DIAGNOSIS — R7309 Other abnormal glucose: Secondary | ICD-10-CM

## 2013-11-05 DIAGNOSIS — D485 Neoplasm of uncertain behavior of skin: Secondary | ICD-10-CM

## 2013-11-05 DIAGNOSIS — H6120 Impacted cerumen, unspecified ear: Secondary | ICD-10-CM

## 2013-11-05 DIAGNOSIS — I10 Essential (primary) hypertension: Secondary | ICD-10-CM

## 2013-11-05 DIAGNOSIS — Z79899 Other long term (current) drug therapy: Secondary | ICD-10-CM

## 2013-11-05 DIAGNOSIS — H6123 Impacted cerumen, bilateral: Secondary | ICD-10-CM

## 2013-11-05 DIAGNOSIS — E782 Mixed hyperlipidemia: Secondary | ICD-10-CM

## 2013-11-05 LAB — CBC WITH DIFFERENTIAL/PLATELET
Basophils Absolute: 0.1 10*3/uL (ref 0.0–0.1)
Basophils Relative: 1 % (ref 0–1)
EOS PCT: 5 % (ref 0–5)
Eosinophils Absolute: 0.3 10*3/uL (ref 0.0–0.7)
HEMATOCRIT: 41.3 % (ref 39.0–52.0)
Hemoglobin: 14 g/dL (ref 13.0–17.0)
LYMPHS ABS: 2.7 10*3/uL (ref 0.7–4.0)
LYMPHS PCT: 45 % (ref 12–46)
MCH: 28.9 pg (ref 26.0–34.0)
MCHC: 33.9 g/dL (ref 30.0–36.0)
MCV: 85.2 fL (ref 78.0–100.0)
MONO ABS: 0.4 10*3/uL (ref 0.1–1.0)
Monocytes Relative: 7 % (ref 3–12)
Neutro Abs: 2.6 10*3/uL (ref 1.7–7.7)
Neutrophils Relative %: 42 % — ABNORMAL LOW (ref 43–77)
Platelets: 217 10*3/uL (ref 150–400)
RBC: 4.85 MIL/uL (ref 4.22–5.81)
RDW: 14.7 % (ref 11.5–15.5)
WBC: 6.1 10*3/uL (ref 4.0–10.5)

## 2013-11-05 NOTE — Progress Notes (Signed)
Patient ID: Gabriel Livingston, male   DOB: 1955/05/02, 58 y.o.   MRN: [id number]   This very nice 58 y.o.MBM presents for 3 month follow up with Hypertension, Hyperlipidemia, Pre-Diabetes and Vitamin D Deficiency.    Patient is treated for HTN & BP has been controlled at home. Today's BP: 114/82 mmHg. Patient denies any cardiac type chest pain, palpitations, dyspnea/orthopnea/PND, dizziness, claudication, or dependent edema.   Hyperlipidemia is controlled with diet & meds. Patient denies myalgias or other med SE's. Last Lipids were 05/06/2013: Cholesterol, Total 152; HDL Cholesterol by NMR 40; LDL (calc) 91; Triglycerides 103   Also, the patient has history of PreDiabetes and patient denies any symptoms of reactive hypoglycemia, diabetic polys, paresthesias or visual blurring.  Last A1c was 05/06/2013: Hemoglobin-A1c 6.4*    Further, Patient has history of Vitamin D Deficiency and patient supplements vitamin D without any suspected side-effects. Last vitamin D was 05/06/2013: Vit D, 25-Hydroxy 87   Medication List   atenolol 100 MG tablet  Commonly known as:  TENORMIN  Take 100 mg by mouth daily. 1/2 qd     hydrocortisone 2.5 % rectal cream  Commonly known as:  ANUSOL-HC  Apply rectally 2 to 4 x daily     multivitamin with minerals tablet  Take 1 tablet by mouth daily.     phentermine 37.5 MG tablet  Commonly known as:  ADIPEX-P  1/2 to 1 tablet every morning for weight loss     pravastatin 40 MG tablet  Commonly known as:  PRAVACHOL     Vitamin D (Ergocalciferol) 50000 UNITS Caps capsule  Commonly known as:  DRISDOL  Take 50,000 Units by mouth. 6 days a week     No Known Allergies  PMHx:   Past Medical History  Diagnosis Date  . Hypertension   . Hyperlipidemia   . Other abnormal glucose   . Hypogonadism male   . Unspecified vitamin D deficiency    FHx:    Reviewed / unchanged SHx:    Reviewed / unchanged  Systems Review:  Constitutional: Denies fever, chills, wt changes,  headaches, insomnia, fatigue, night sweats, change in appetite. Eyes: Denies redness, blurred vision, diplopia, discharge, itchy, watery eyes.  ENT: Denies discharge, congestion, post nasal drip, epistaxis, sore throat, earache, hearing loss, dental pain, tinnitus, vertigo, sinus pain, snoring.  CV: Denies chest pain, palpitations, irregular heartbeat, syncope, dyspnea, diaphoresis, orthopnea, PND, claudication or edema. Respiratory: denies cough, dyspnea, DOE, pleurisy, hoarseness, laryngitis, wheezing.  Gastrointestinal: Denies dysphagia, odynophagia, heartburn, reflux, water brash, abdominal pain or cramps, nausea, vomiting, bloating, diarrhea, constipation, hematemesis, melena, hematochezia  or hemorrhoids. Genitourinary: Denies dysuria, frequency, urgency, nocturia, hesitancy, discharge, hematuria or flank pain. Musculoskeletal: Denies arthralgias, myalgias, stiffness, jt. swelling, pain, limping or strain/sprain.  Skin: Denies pruritus, rash, hives, warts, acne, or eczema, patient is concerned about skin lesions ? Changing in size of the right chest & left thigh. Neuro: No weakness, tremor, incoordination, spasms, paresthesia or pain. Psychiatric: Denies confusion, memory loss or sensory loss. Endo: Denies change in weight, skin or hair change.  Heme/Lymph: No excessive bleeding, bruising or enlarged lymph nodes.  Exam:  BP 114/82  Pulse 76  Temp(Src) 97.7 F (36.5 C) (Temporal)  Resp 16  Ht 5\' 9"  (1.753 m)  Wt 223 lb 12.8 oz (101.515 kg)  BMI 33.03 kg/m2  Appears well nourished and in no distress. Eyes: PERRLA, EOMs, conjunctiva no swelling or erythema. Sinuses: No frontal/maxillary tenderness ENT/Mouth: EAC's clear, TM's nl w/o erythema, bulging. Nares clear  w/o erythema, swelling, exudates. Oropharynx clear without erythema or exudates. Oral hygiene is good. Tongue normal, non obstructing. Hearing intact.  Neck: Supple. Thyroid nl. Car 2+/2+ without bruits, nodes or JVD. Chest:  Respirations nl with BS clear & equal w/o rales, rhonchi, wheezing or stridor.  Cor: Heart sounds normal w/ regular rate and rhythm without sig. murmurs, gallops, clicks, or rubs. Peripheral pulses normal and equal  without edema.  Abdomen: Soft & bowel sounds normal. Non-tender w/o guarding, rebound, hernias, masses, or organomegaly.  Lymphatics: Unremarkable.  Musculoskeletal: Full ROM all peripheral extremities, joint stability, 5/5 strength, and normal gait.    Neuro: Cranial nerves intact, reflexes equal bilaterally. Sensory-motor testing grossly intact. Tendon reflexes grossly intact.  Pysch: Alert & oriented x 3. Insight and judgement nl & appropriate. No ideations. Skin: Warm, dry without exposed rashesor ecchymosis apparent. Patient had a raised dark brown lesion of the upper right chest in the AAL and a similar lesion along the medial proximal left thigh. With informed consent both lesions were prepped in a sterile fashion and anesthetized with Marcaine 0.5% w/epi and then sharply excised and wound bases were fulgurated with the hyfrecator and then sterile dressings applied.  Assessment and Plan:  1. Hypertension - Continue monitor blood pressure at home. Continue diet/meds same.  2. Hyperlipidemia - Continue diet/meds, exercise,& lifestyle modifications. Continue monitor periodic cholesterol/liver & renal functions   3. Pre-Diabetes - Continue diet, exercise, lifestyle modifications. Monitor appropriate labs.  4. Vitamin D Deficiency - Continue supplementation.  5. Morbid Obesity (BMI 33)  6. Skin Lesions of uncertain Significance - excised & patient instructed in wound care.  Recommended regular exercise, BP monitoring, weight control, and discussed med and SE's. Recommended labs to assess and monitor clinical status. Further disposition pending results of labs.

## 2013-11-05 NOTE — Patient Instructions (Signed)

## 2013-11-06 ENCOUNTER — Encounter: Payer: Self-pay | Admitting: Gastroenterology

## 2013-11-06 LAB — HEPATIC FUNCTION PANEL
ALBUMIN: 4.6 g/dL (ref 3.5–5.2)
ALK PHOS: 48 U/L (ref 39–117)
ALT: 26 U/L (ref 0–53)
AST: 23 U/L (ref 0–37)
Bilirubin, Direct: 0.2 mg/dL (ref 0.0–0.3)
Indirect Bilirubin: 1.1 mg/dL (ref 0.2–1.2)
TOTAL PROTEIN: 7.5 g/dL (ref 6.0–8.3)
Total Bilirubin: 1.3 mg/dL — ABNORMAL HIGH (ref 0.2–1.2)

## 2013-11-06 LAB — HEMOGLOBIN A1C
Hgb A1c MFr Bld: 6.2 % — ABNORMAL HIGH (ref <5.7)
MEAN PLASMA GLUCOSE: 131 mg/dL — AB (ref <117)

## 2013-11-06 LAB — LIPID PANEL
Cholesterol: 164 mg/dL (ref 0–200)
HDL: 44 mg/dL (ref >39)
LDL CALC: 93 mg/dL (ref 0–99)
Total CHOL/HDL Ratio: 3.7 Ratio
Triglycerides: 137 mg/dL (ref <150)
VLDL: 27 mg/dL (ref 0–40)

## 2013-11-06 LAB — BASIC METABOLIC PANEL WITH GFR
BUN: 21 mg/dL (ref 6–23)
CHLORIDE: 107 meq/L (ref 96–112)
CO2: 28 meq/L (ref 19–32)
CREATININE: 1.36 mg/dL — AB (ref 0.50–1.35)
Calcium: 10 mg/dL (ref 8.4–10.5)
GFR, Est African American: 66 mL/min
GFR, Est Non African American: 57 mL/min — ABNORMAL LOW
Glucose, Bld: 100 mg/dL — ABNORMAL HIGH (ref 70–99)
Potassium: 4.2 mEq/L (ref 3.5–5.3)
Sodium: 143 mEq/L (ref 135–145)

## 2013-11-06 LAB — MAGNESIUM: Magnesium: 2.2 mg/dL (ref 1.5–2.5)

## 2013-11-06 LAB — TSH: TSH: 1.788 u[IU]/mL (ref 0.350–4.500)

## 2013-11-06 LAB — INSULIN, FASTING: INSULIN FASTING, SERUM: 46 u[IU]/mL — AB (ref 3–28)

## 2013-11-07 LAB — VITAMIN D 25 HYDROXY (VIT D DEFICIENCY, FRACTURES): Vit D, 25-Hydroxy: 107 ng/mL — ABNORMAL HIGH (ref 30–89)

## 2013-11-22 ENCOUNTER — Other Ambulatory Visit: Payer: Self-pay | Admitting: Internal Medicine

## 2013-12-14 ENCOUNTER — Other Ambulatory Visit: Payer: Self-pay | Admitting: Internal Medicine

## 2013-12-28 ENCOUNTER — Ambulatory Visit (AMBULATORY_SURGERY_CENTER): Payer: 59 | Admitting: *Deleted

## 2013-12-28 VITALS — Ht 69.0 in | Wt 223.4 lb

## 2013-12-28 DIAGNOSIS — Z86010 Personal history of colonic polyps: Secondary | ICD-10-CM

## 2013-12-28 MED ORDER — MOVIPREP 100 G PO SOLR: 1.0000 | Freq: Once | ORAL | Status: DC

## 2013-12-28 NOTE — Progress Notes (Signed)
No home 02, no cpap use. ewm No egg or soy allergy. ewm Pt does take Adipex which contains phentermine and told to stop this 10 days before the procedure. ewm Pt declined emmi video. ewm No problems with sedation in the past,. ewm

## 2014-01-11 ENCOUNTER — Ambulatory Visit (AMBULATORY_SURGERY_CENTER): Payer: 59 | Admitting: Gastroenterology

## 2014-01-11 ENCOUNTER — Encounter: Payer: Self-pay | Admitting: Gastroenterology

## 2014-01-11 VITALS — BP 117/82 | HR 58 | Temp 97.2°F | Resp 19 | Ht 69.0 in | Wt 223.0 lb

## 2014-01-11 DIAGNOSIS — D123 Benign neoplasm of transverse colon: Secondary | ICD-10-CM

## 2014-01-11 DIAGNOSIS — Z1211 Encounter for screening for malignant neoplasm of colon: Secondary | ICD-10-CM

## 2014-01-11 DIAGNOSIS — Z86010 Personal history of colonic polyps: Secondary | ICD-10-CM

## 2014-01-11 MED ORDER — SODIUM CHLORIDE 0.9 % IV SOLN: 500.0000 mL | INTRAVENOUS | Status: DC

## 2014-01-11 NOTE — Op Note (Signed)
[place]  [address] [place], [id number]   COLONOSCOPY PROCEDURE REPORT  PATIENT: Gabriel Livingston, Gabriel Livingston  MR#: [id number] BIRTHDATE: [birth year] , 19  yrs. old GENDER: male ENDOSCOPIST: Milus Banister, MD PROCEDURE DATE:  01/11/2014 PROCEDURE:   Colonoscopy with snare polypectomy First Screening Colonoscopy - Avg.  risk and is 50 yrs.  old or older - No.  Prior Negative Screening - Now for repeat screening. N/A  History of Adenoma - Now for follow-up colonoscopy & has been > or = to 3 yrs.  Yes hx of adenoma.  Has been 3 or more years since last colonoscopy.  Polyps Removed Today? Yes. ASA CLASS:   Class II INDICATIONS:1.7cm adenoma colonoscopy 2007; colonoscopy 2010 was normal. MEDICATIONS: Monitored anesthesia care and Propofol 230 mg IV  DESCRIPTION OF PROCEDURE:   After the risks benefits and alternatives of the procedure were thoroughly explained, informed consent was obtained.  The digital rectal exam revealed no abnormalities of the rectum.   The [id number]  endoscope was introduced through the anus and advanced to the cecum, which was identified by both the appendix and ileocecal valve. No adverse events experienced.   The quality of the prep was excellent.  The instrument was then slowly withdrawn as the colon was fully examined.   COLON FINDINGS: A sessile polyp ranging between 3-17mm in size was found in the transverse colon.  A polypectomy was performed with a cold snare.  The resection was complete, the polyp tissue was completely retrieved and sent to histology.   The examination was otherwise normal.  Retroflexed views revealed no abnormalities. The time to cecum=2 minutes 58 seconds.  Withdrawal time=10 minutes 39 seconds.  The scope was withdrawn and the procedure completed. COMPLICATIONS: There were no immediate complications.  ENDOSCOPIC IMPRESSION: 1.   Sessile polyp ranging between 3-43mm in size was found in the transverse  colon; polypectomy was performed with a cold snare 2.   The examination was otherwise normal  RECOMMENDATIONS: 1.  Given your personal history of adenomatous (pre-cancerous) polyps, you will need a repeat colonoscopy in 5 years even if the polyp removed today is NOT precancerous.  You will receive a letter within 1-2 weeks with the results of your biopsy as well as final recommendations.  Please call my office if you have not received a letter after 3 weeks.  eSigned:  Milus Banister, MD 01/11/2014 9:09 AM   cc: Unk Pinto, MD

## 2014-01-11 NOTE — Progress Notes (Signed)
Patient denies any allergies to eggs or soy. Patient denies any problems with anesthesia/sedation.

## 2014-01-11 NOTE — Progress Notes (Signed)
Called to room to assist during endoscopic procedure.  Patient ID and intended procedure confirmed with present staff. Received instructions for my participation in the procedure from the performing physician.  

## 2014-01-11 NOTE — Patient Instructions (Signed)
Impressions/recomendations:  Polyp (handout given)  YOU HAD AN ENDOSCOPIC PROCEDURE TODAY AT [facility]: Refer to the procedure report that was given to you for any specific questions about what was found during the examination.  If the procedure report does not answer your questions, please call your gastroenterologist to clarify.  If you requested that your care partner not be given the details of your procedure findings, then the procedure report has been included in a sealed envelope for you to review at your convenience later.  YOU SHOULD EXPECT: Some feelings of bloating in the abdomen. Passage of more gas than usual.  Walking can help get rid of the air that was put into your GI tract during the procedure and reduce the bloating. If you had a lower endoscopy (such as a colonoscopy or flexible sigmoidoscopy) you may notice spotting of blood in your stool or on the toilet paper. If you underwent a bowel prep for your procedure, then you may not have a normal bowel movement for a few days.  DIET: Your first meal following the procedure should be a light meal and then it is ok to progress to your normal diet.  A half-sandwich or bowl of soup is an example of a good first meal.  Heavy or fried foods are harder to digest and may make you feel nauseous or bloated.  Likewise meals heavy in dairy and vegetables can cause extra gas to form and this can also increase the bloating.  Drink plenty of fluids but you should avoid alcoholic beverages for 24 hours.  ACTIVITY: Your care partner should take you home directly after the procedure.  You should plan to take it easy, moving slowly for the rest of the day.  You can resume normal activity the day after the procedure however you should NOT DRIVE or use heavy machinery for 24 hours (because of the sedation medicines used during the test).    SYMPTOMS TO REPORT IMMEDIATELY: A gastroenterologist can be reached at any hour.  During normal  business hours, 8:30 AM to 5:00 PM Monday through Friday, call [phone].  After hours and on weekends, please call the GI answering service at [phone] who will take a message and have the physician on call contact you.   Following lower endoscopy (colonoscopy or flexible sigmoidoscopy):  Excessive amounts of blood in the stool  Significant tenderness or worsening of abdominal pains  Swelling of the abdomen that is new, acute  Fever of 100F or higher   FOLLOW UP: If any biopsies were taken you will be contacted by phone or by letter within the next 1-3 weeks.  Call your gastroenterologist if you have not heard about the biopsies in 3 weeks.  Our staff will call the home number listed on your records the next business day following your procedure to check on you and address any questions or concerns that you may have at that time regarding the information given to you following your procedure. This is a courtesy call and so if there is no answer at the home number and we have not heard from you through the emergency physician on call, we will assume that you have returned to your regular daily activities without incident.  SIGNATURES/CONFIDENTIALITY: You and/or your care partner have signed paperwork which will be entered into your electronic medical record.  These signatures attest to the fact that that the information above on your After Visit Summary has been reviewed and is understood.  Full responsibility of the confidentiality of this discharge information lies with you and/or your care-partner.

## 2014-01-11 NOTE — Progress Notes (Signed)
Report to PACU, RN, vss, BBS= Clear.  

## 2014-01-12 ENCOUNTER — Telehealth: Payer: Self-pay

## 2014-01-12 NOTE — Telephone Encounter (Signed)
  Follow up Call-  Call back number 01/11/2014  Post procedure Call Back phone  # [phone]  Permission to leave phone message Yes     Patient questions:  Do you have a fever, pain , or abdominal swelling? No. Pain Score  0 *  Have you tolerated food without any problems? Yes.    Have you been able to return to your normal activities? Yes.    Do you have any questions about your discharge instructions: Diet   No. Medications  No. Follow up visit  No.  Do you have questions or concerns about your Care? No.  Actions: * If pain score is 4 or above: No action needed, pain <4.

## 2014-01-14 ENCOUNTER — Encounter: Payer: Self-pay | Admitting: Gastroenterology

## 2014-01-18 ENCOUNTER — Other Ambulatory Visit: Payer: Self-pay | Admitting: Emergency Medicine

## 2014-02-08 ENCOUNTER — Ambulatory Visit: Payer: Self-pay | Admitting: Physician Assistant

## 2014-05-06 ENCOUNTER — Ambulatory Visit (INDEPENDENT_AMBULATORY_CARE_PROVIDER_SITE_OTHER): Payer: 59 | Admitting: Internal Medicine

## 2014-05-06 ENCOUNTER — Encounter: Payer: Self-pay | Admitting: Internal Medicine

## 2014-05-06 VITALS — BP 128/84 | HR 76 | Temp 98.1°F | Resp 16 | Ht 69.0 in | Wt 240.0 lb

## 2014-05-06 DIAGNOSIS — I10 Essential (primary) hypertension: Secondary | ICD-10-CM

## 2014-05-06 DIAGNOSIS — R7303 Prediabetes: Secondary | ICD-10-CM

## 2014-05-06 DIAGNOSIS — R5383 Other fatigue: Secondary | ICD-10-CM

## 2014-05-06 DIAGNOSIS — Z111 Encounter for screening for respiratory tuberculosis: Secondary | ICD-10-CM

## 2014-05-06 DIAGNOSIS — Z79899 Other long term (current) drug therapy: Secondary | ICD-10-CM

## 2014-05-06 DIAGNOSIS — Z125 Encounter for screening for malignant neoplasm of prostate: Secondary | ICD-10-CM

## 2014-05-06 DIAGNOSIS — E782 Mixed hyperlipidemia: Secondary | ICD-10-CM

## 2014-05-06 DIAGNOSIS — Z1212 Encounter for screening for malignant neoplasm of rectum: Secondary | ICD-10-CM

## 2014-05-06 DIAGNOSIS — E559 Vitamin D deficiency, unspecified: Secondary | ICD-10-CM

## 2014-05-06 LAB — CBC WITH DIFFERENTIAL/PLATELET
BASOS PCT: 1 % (ref 0–1)
Basophils Absolute: 0.1 10*3/uL (ref 0.0–0.1)
EOS PCT: 4 % (ref 0–5)
Eosinophils Absolute: 0.2 10*3/uL (ref 0.0–0.7)
HCT: 40.3 % (ref 39.0–52.0)
Hemoglobin: 13.9 g/dL (ref 13.0–17.0)
Lymphocytes Relative: 43 % (ref 12–46)
Lymphs Abs: 2.7 10*3/uL (ref 0.7–4.0)
MCH: 29.4 pg (ref 26.0–34.0)
MCHC: 34.5 g/dL (ref 30.0–36.0)
MCV: 85.4 fL (ref 78.0–100.0)
MONOS PCT: 8 % (ref 3–12)
MPV: 9.1 fL (ref 8.6–12.4)
Monocytes Absolute: 0.5 10*3/uL (ref 0.1–1.0)
NEUTROS ABS: 2.7 10*3/uL (ref 1.7–7.7)
NEUTROS PCT: 44 % (ref 43–77)
Platelets: 203 10*3/uL (ref 150–400)
RBC: 4.72 MIL/uL (ref 4.22–5.81)
RDW: 14.1 % (ref 11.5–15.5)
WBC: 6.2 10*3/uL (ref 4.0–10.5)

## 2014-05-06 NOTE — Progress Notes (Signed)
Patient ID: Gabriel Livingston, male   DOB: [birth year], 59 y.o.   MRN: [id number] Annual Comprehensive Examination  This very nice 59 y.o.male presents for complete physical.  Patient has been followed for HTN, Prediabetes, Hyperlipidemia, and Vitamin D Deficiency.   HTN predates since 2005. Patient's BP has been controlled at home.Today's BP: 128/84 mmHg. Patient denies any cardiac symptoms as chest pain, palpitations, shortness of breath, dizziness or ankle swelling.   Patient's hyperlipidemia is controlled with diet and medications. Patient denies myalgias or other medication SE's. Last lipids were at goal - Total Chol164; HDL 44; LDL 93; Trig 137 on 11/05/2013.   Patient has Morbid Obesity and BMI 35.43 and consequent  T2_DM / prediabetes since Feb 2-012 with an A1c of 6.5%  and patient denies reactive hypoglycemic symptoms, visual blurring, diabetic polys or paresthesias. Last A1c was  6.2% on   11/05/2013.   Finally, patient has history of Vitamin D Deficiency of 19 in 2008 and last vitamin D was 107 on  11/05/2013 and dos e wa stapered.  Medication Sig  . atenolol  100 MG tab Take 100 mg by mouth daily. 1/2 qd  .  MULTIVITAMIN WITH MINERALS Take 1 tablet by mouth daily.  . phentermine (ADIPEX-P) 37.5 MG tablet TAKE 1/2-1 TABLET EVERY MORNING  . Vitamin D 50,000 U TAKE ONE CAPSULE BY MOUTH EVERY DAY  . pravastatin  40 MG tablet TAKE 1 TABLET AT BEDTIME (Patient not taking: Reported on 05/06/2014)   No Known Allergies   Past Medical History  Diagnosis Date  . Hypertension   . Hyperlipidemia   . Other abnormal glucose   . Hypogonadism male   . Unspecified vitamin D deficiency   . Sleep apnea     mild, no cpap   Health Maintenance  Topic Date Due  . INFLUENZA VACCINE  10/31/2013  . TETANUS/TDAP  04/03/2015  . COLONOSCOPY  01/12/2019   Immunization History  Administered Date(s) Administered  . PPD Test 05/06/2013  . Td 04/02/2005   Past Surgical History  Procedure Laterality Date   . Colonoscopy    . Polypectomy     Family History  Problem Relation Age of Onset  . Heart disease Mother   . Hyperlipidemia Mother   . Hypertension Mother   . Cancer Father     lung, prostate  . Colon cancer Neg Hx    History   Social History  . Marital Status: Married    Spouse Name: N/A    Number of Children: N/A  . Years of Education: N/A   Occupational History  . Not on file.   Social History Main Topics  . Smoking status: Never Smoker   . Smokeless tobacco: Never Used  . Alcohol Use: No  . Drug Use: No  . Sexual Activity: Not on file    ROS Constitutional: Denies fever, chills, weight loss/gain, headaches, insomnia, fatigue, night sweats or change in appetite. Eyes: Denies redness, blurred vision, diplopia, discharge, itchy or watery eyes.  ENT: Denies discharge, congestion, post nasal drip, epistaxis, sore throat, earache, hearing loss, dental pain, Tinnitus, Vertigo, Sinus pain or snoring.  Cardio: Denies chest pain, palpitations, irregular heartbeat, syncope, dyspnea, diaphoresis, orthopnea, PND, claudication or edema Respiratory: denies cough, dyspnea, DOE, pleurisy, hoarseness, laryngitis or wheezing.  Gastrointestinal: Denies dysphagia, heartburn, reflux, water brash, pain, cramps, nausea, vomiting, bloating, diarrhea, constipation, hematemesis, melena, hematochezia, jaundice or hemorrhoids Genitourinary: Denies dysuria, frequency, urgency, nocturia, hesitancy, discharge, hematuria or flank pain Musculoskeletal: Denies arthralgia, myalgia, stiffness, Jt.  Swelling, pain, limp or strain/sprain. Denies Falls. Skin: Denies puritis, rash, hives, warts, acne, eczema or change in skin lesion Neuro: No weakness, tremor, incoordination, spasms, paresthesia or pain Psychiatric: Denies confusion, memory loss or sensory loss. Denies Depression. Endocrine: Denies change in weight, skin, hair change, nocturia, and paresthesia, diabetic polys, visual blurring or hyper / hypo  glycemic episodes.  Heme/Lymph: No excessive bleeding, bruising or enlarged lymph nodes.  Physical Exam  BP 128/84   Pulse 76  Temp 98.1 F   Resp 16  Ht 5\' 9"    Wt 240 lb     BMI 35.43  General Appearance: Well nourished, in no apparent distress. Eyes: PERRLA, EOMs, conjunctiva no swelling or erythema, normal fundi and vessels. Sinuses: No frontal/maxillary tenderness ENT/Mouth: EACs patent / TMs  nl. Nares clear without erythema, swelling, mucoid exudates. Oral hygiene is good. No erythema, swelling, or exudate. Tongue normal, non-obstructing. Tonsils not swollen or erythematous. Hearing normal.  Neck: Supple, thyroid normal. No bruits, nodes or JVD. Respiratory: Respiratory effort normal.  BS equal and clear bilateral without rales, rhonci, wheezing or stridor. Cardio: Heart sounds are normal with regular rate and rhythm and no murmurs, rubs or gallops. Peripheral pulses are normal and equal bilaterally without edema. No aortic or femoral bruits. Chest: symmetric with normal excursions and percussion.  Abdomen: Flat, soft, with bowl sounds. Nontender, no guarding, rebound, hernias, masses, or organomegaly.  Lymphatics: Non tender without lymphadenopathy.  Genitourinary: No hernias.Testes nl. DRE - prostate nl for age - smooth & firm w/o nodules. Musculoskeletal: Full ROM all peripheral extremities, joint stability, 5/5 strength, and normal gait. Skin: Warm and dry without rashes, lesions, cyanosis, clubbing or  ecchymosis.  Neuro: Cranial nerves intact, reflexes equal bilaterally. Normal muscle tone, no cerebellar symptoms. Sensation intact.  Pysch: Awake and oriented X 3 with normal affect, insight and judgment appropriate.   Assessment and Plan  1. Essential hypertension  - Microalbumin / creatinine urine ratio - EKG 12-Lead - [place], RETROPERITNL ABD,  LTD  2. Hyperlipidemia  - Lipid panel  3. Vitamin D deficiency  - Vit D  25 hydroxy (rtn osteoporosis monitoring)  4.  Morbid obesity   5. Prediabetes  - Hemoglobin A1c - Insulin, fasting  6. Screening for rectal cancer  - POC Hemoccult Bld/Stl (3-Cd Home Screen); Future  7. Prostate cancer screening  - PSA  8. Medication management  - Urine Microscopic - CBC with Differential/Platelet - BASIC METABOLIC PANEL WITH GFR - Hepatic function panel - Magnesium  9. Other fatigue  - Vitamin B12 - Testosterone - Iron and TIBC - TSH   Continue prudent diet as discussed, weight control, BP monitoring, regular exercise, and medications as discussed.  Discussed med effects and SE's. Routine screening labs and tests as requested with regular follow-up as recommended.

## 2014-05-06 NOTE — Patient Instructions (Signed)

## 2014-05-07 LAB — HEPATIC FUNCTION PANEL
ALBUMIN: 4.1 g/dL (ref 3.5–5.2)
ALT: 22 U/L (ref 0–53)
AST: 19 U/L (ref 0–37)
Alkaline Phosphatase: 57 U/L (ref 39–117)
BILIRUBIN DIRECT: 0.1 mg/dL (ref 0.0–0.3)
BILIRUBIN INDIRECT: 0.6 mg/dL (ref 0.2–1.2)
BILIRUBIN TOTAL: 0.7 mg/dL (ref 0.2–1.2)
TOTAL PROTEIN: 7.1 g/dL (ref 6.0–8.3)

## 2014-05-07 LAB — LIPID PANEL
Cholesterol: 151 mg/dL (ref 0–200)
HDL: 38 mg/dL — ABNORMAL LOW (ref >39)
LDL CALC: 75 mg/dL (ref 0–99)
TRIGLYCERIDES: 192 mg/dL — AB (ref <150)
Total CHOL/HDL Ratio: 4 Ratio
VLDL: 38 mg/dL (ref 0–40)

## 2014-05-07 LAB — URINALYSIS, MICROSCOPIC ONLY
Casts: NONE SEEN
Crystals: NONE SEEN
SQUAMOUS EPITHELIAL / LPF: NONE SEEN

## 2014-05-07 LAB — HEMOGLOBIN A1C
Hgb A1c MFr Bld: 6.4 % — ABNORMAL HIGH (ref <5.7)
Mean Plasma Glucose: 137 mg/dL — ABNORMAL HIGH (ref <117)

## 2014-05-07 LAB — IRON AND TIBC
%SAT: 23 % (ref 20–55)
IRON: 73 ug/dL (ref 42–165)
TIBC: 323 ug/dL (ref 215–435)
UIBC: 250 ug/dL (ref 125–400)

## 2014-05-07 LAB — BASIC METABOLIC PANEL WITH GFR
BUN: 21 mg/dL (ref 6–23)
CO2: 25 meq/L (ref 19–32)
Calcium: 9.5 mg/dL (ref 8.4–10.5)
Chloride: 105 mEq/L (ref 96–112)
Creat: 1.32 mg/dL (ref 0.50–1.35)
GFR, EST AFRICAN AMERICAN: 68 mL/min
GFR, EST NON AFRICAN AMERICAN: 59 mL/min — AB
Glucose, Bld: 103 mg/dL — ABNORMAL HIGH (ref 70–99)
Potassium: 4.2 mEq/L (ref 3.5–5.3)
SODIUM: 140 meq/L (ref 135–145)

## 2014-05-07 LAB — TESTOSTERONE: Testosterone: 203 ng/dL — ABNORMAL LOW (ref 300–890)

## 2014-05-07 LAB — MAGNESIUM: Magnesium: 1.9 mg/dL (ref 1.5–2.5)

## 2014-05-07 LAB — PSA: PSA: 2.16 ng/mL (ref <=4.00)

## 2014-05-07 LAB — MICROALBUMIN / CREATININE URINE RATIO
Creatinine, Urine: 240.3 mg/dL
Microalb Creat Ratio: 2.5 mg/g (ref 0.0–30.0)
Microalb, Ur: 0.6 mg/dL (ref <2.0)

## 2014-05-07 LAB — TSH: TSH: 1.702 u[IU]/mL (ref 0.350–4.500)

## 2014-05-07 LAB — VITAMIN D 25 HYDROXY (VIT D DEFICIENCY, FRACTURES): Vit D, 25-Hydroxy: 82 ng/mL (ref 30–100)

## 2014-05-07 LAB — VITAMIN B12: Vitamin B-12: 727 pg/mL (ref 211–911)

## 2014-05-07 LAB — INSULIN, FASTING: INSULIN FASTING, SERUM: 56.8 u[IU]/mL — AB (ref 2.0–19.6)

## 2014-05-07 NOTE — Addendum Note (Signed)
Addended by: Emelda Brothers on: 05/07/2014 08:18 AM   Modules accepted: Orders

## 2014-08-23 ENCOUNTER — Ambulatory Visit: Payer: Self-pay | Admitting: Physician Assistant

## 2014-12-07 ENCOUNTER — Ambulatory Visit: Payer: Self-pay | Admitting: Internal Medicine

## 2015-01-04 ENCOUNTER — Ambulatory Visit (INDEPENDENT_AMBULATORY_CARE_PROVIDER_SITE_OTHER): Payer: Commercial Managed Care - HMO | Admitting: Internal Medicine

## 2015-01-04 ENCOUNTER — Encounter: Payer: Self-pay | Admitting: Internal Medicine

## 2015-01-04 VITALS — BP 120/84 | HR 72 | Temp 97.9°F | Resp 16 | Ht 69.0 in | Wt 236.0 lb

## 2015-01-04 DIAGNOSIS — Z6834 Body mass index (BMI) 34.0-34.9, adult: Secondary | ICD-10-CM

## 2015-01-04 DIAGNOSIS — E559 Vitamin D deficiency, unspecified: Secondary | ICD-10-CM | POA: Diagnosis not present

## 2015-01-04 DIAGNOSIS — I10 Essential (primary) hypertension: Secondary | ICD-10-CM | POA: Diagnosis not present

## 2015-01-04 DIAGNOSIS — Z79899 Other long term (current) drug therapy: Secondary | ICD-10-CM

## 2015-01-04 DIAGNOSIS — R7303 Prediabetes: Secondary | ICD-10-CM

## 2015-01-04 DIAGNOSIS — E669 Obesity, unspecified: Secondary | ICD-10-CM | POA: Insufficient documentation

## 2015-01-04 DIAGNOSIS — E782 Mixed hyperlipidemia: Secondary | ICD-10-CM | POA: Diagnosis not present

## 2015-01-04 NOTE — Progress Notes (Signed)
Patient ID: Gabriel Livingston, male   DOB: [birth year], 59 y.o.   MRN: [id number]   This very nice 59 y.o. MBM presents for follow up with Hypertension, Hyperlipidemia, Pre-Diabetes and Vitamin D Deficiency. Patient related having sporadic issues with ED and requested Rx for Viagra.   Patient is treated for HTN since 2005 & BP has been controlled at home. Today's BP: 120/84 mmHg. Patient has had no complaints of any cardiac type chest pain, palpitations, dyspnea/orthopnea/PND, dizziness, claudication, or dependent edema.   Hyperlipidemia is controlled with diet & meds. Patient denies myalgias or other med SE's. Last Lipids were at goal with Cholesterol 151; HDL 38*; LDL 75; and elevated Triglycerides 192 on 05/06/2014.   Also, the patient has history of Morbid Obesity (BMI 34.85) and consequent PreDiabetes and has had no symptoms of reactive hypoglycemia, diabetic polys, paresthesias or visual blurring.  Last A1c was 6.4% on 05/06/2014   Further, the patient also has history of Vitamin D Deficiency of 19 in 2008 and supplements vitamin D without any suspected side-effects. Last vitamin D was 82 on 05/06/2014.  Medication Sig  . atenolol (TENORMIN) 100 MG tablet Take 100 mg by mouth daily. 1/2 qd  . Multiple Vitamins-Minerals (MULTIVITAMIN WITH MINERALS) tablet Take 1 tablet by mouth daily.  . phentermine (ADIPEX-P) 37.5 MG tablet TAKE 1/2-1 TABLET EVERY MORNING  . pravastatin (PRAVACHOL) 40 MG tablet TAKE 1 TABLET AT BEDTIME   No Known Allergies  PMHx:   Past Medical History  Diagnosis Date  . Hypertension   . Hyperlipidemia   . Other abnormal glucose   . Hypogonadism male   . Unspecified vitamin D deficiency   . Sleep apnea     mild, no cpap   Immunization History  Administered Date(s) Administered  . PPD Test 05/06/2013, 05/07/2014  . Td 04/02/2005   Past Surgical History  Procedure Laterality Date  . Colonoscopy    . Polypectomy     FHx:    Reviewed / unchanged  SHx:     Reviewed / unchanged  Systems Review:  Constitutional: Denies fever, chills, wt changes, headaches, insomnia, fatigue, night sweats, change in appetite. Eyes: Denies redness, blurred vision, diplopia, discharge, itchy, watery eyes.  ENT: Denies discharge, congestion, post nasal drip, epistaxis, sore throat, earache, hearing loss, dental pain, tinnitus, vertigo, sinus pain, snoring.  CV: Denies chest pain, palpitations, irregular heartbeat, syncope, dyspnea, diaphoresis, orthopnea, PND, claudication or edema. Respiratory: denies cough, dyspnea, DOE, pleurisy, hoarseness, laryngitis, wheezing.  Gastrointestinal: Denies dysphagia, odynophagia, heartburn, reflux, water brash, abdominal pain or cramps, nausea, vomiting, bloating, diarrhea, constipation, hematemesis, melena, hematochezia  or hemorrhoids. Genitourinary: Denies dysuria, frequency, urgency, nocturia, hesitancy, discharge, hematuria or flank pain. Musculoskeletal: Denies arthralgias, myalgias, stiffness, jt. swelling, pain, limping or strain/sprain.  Skin: Denies pruritus, rash, hives, warts, acne, eczema or change in skin lesion(s). Neuro: No weakness, tremor, incoordination, spasms, paresthesia or pain. Psychiatric: Denies confusion, memory loss or sensory loss. Endo: Denies change in weight, skin or hair change.  Heme/Lymph: No excessive bleeding, bruising or enlarged lymph nodes.  Physical Exam  BP 120/84 mmHg  Pulse 72  Temp(Src) 97.9 F (36.6 C)  Resp 16  Ht 5\' 9"  (1.753 m)  Wt 236 lb (107.049 kg)  BMI 34.84 kg/m2  Appears well nourished and in no distress. Eyes: PERRLA, EOMs, conjunctiva no swelling or erythema. Sinuses: No frontal/maxillary tenderness ENT/Mouth: EAC's clear, TM's nl w/o erythema, bulging. Nares clear w/o erythema, swelling, exudates. Oropharynx clear without erythema or exudates. Oral hygiene is good.  Tongue normal, non obstructing. Hearing intact.  Neck: Supple. Thyroid nl. Car 2+/2+ without bruits,  nodes or JVD. Chest: Respirations nl with BS clear & equal w/o rales, rhonchi, wheezing or stridor.  Cor: Heart sounds normal w/ regular rate and rhythm without sig. murmurs, gallops, clicks, or rubs. Peripheral pulses normal and equal  without edema.  Abdomen: Soft & bowel sounds normal. Non-tender w/o guarding, rebound, hernias, masses, or organomegaly.  Lymphatics: Unremarkable.  Musculoskeletal: Full ROM all peripheral extremities, joint stability, 5/5 strength, and normal gait.  Skin: Warm, dry without exposed rashes, lesions or ecchymosis apparent.  Neuro: Cranial nerves intact, reflexes equal bilaterally. Sensory-motor testing grossly intact. Tendon reflexes grossly intact.  Pysch: Alert & oriented x 3.  Insight and judgement nl & appropriate. No ideations.  Assessment and Plan:  1. Essential hypertension  - TSH  2. Hyperlipidemia  - Lipid panel  3. Prediabetes  - Hemoglobin A1c - Insulin, random  4. Vitamin D deficiency  - Vit D   5. Morbid obesity, unspecified obesity type ([place])   6. BMI 34.85,adult   7. Medication management  - CBC with Differential/Platelet - BASIC METABOLIC PANEL WITH GFR - Hepatic function panel - Magnesium  8. ED   - Rx Sildenafil 20 mg tabs #[id number]    Recommended regular exercise, BP monitoring, weight control, and discussed med and SE's. Recommended labs to assess and monitor clinical status. Further disposition pending results of labs. Over 30 minutes of exam, counseling, chart review was performed

## 2015-01-05 LAB — CBC WITH DIFFERENTIAL/PLATELET
Basophils Absolute: 0.1 10*3/uL (ref 0.0–0.1)
Basophils Relative: 1 % (ref 0–1)
EOS PCT: 2 % (ref 0–5)
Eosinophils Absolute: 0.1 10*3/uL (ref 0.0–0.7)
HCT: 38.4 % — ABNORMAL LOW (ref 39.0–52.0)
HEMOGLOBIN: 12.7 g/dL — AB (ref 13.0–17.0)
Lymphocytes Relative: 48 % — ABNORMAL HIGH (ref 12–46)
Lymphs Abs: 2.5 10*3/uL (ref 0.7–4.0)
MCH: 28.7 pg (ref 26.0–34.0)
MCHC: 33.1 g/dL (ref 30.0–36.0)
MCV: 86.7 fL (ref 78.0–100.0)
MPV: 9.4 fL (ref 8.6–12.4)
Monocytes Absolute: 0.3 10*3/uL (ref 0.1–1.0)
Monocytes Relative: 6 % (ref 3–12)
NEUTROS ABS: 2.2 10*3/uL (ref 1.7–7.7)
Neutrophils Relative %: 43 % (ref 43–77)
Platelets: 187 10*3/uL (ref 150–400)
RBC: 4.43 MIL/uL (ref 4.22–5.81)
RDW: 14.6 % (ref 11.5–15.5)
WBC: 5.2 10*3/uL (ref 4.0–10.5)

## 2015-01-05 LAB — VITAMIN D 25 HYDROXY (VIT D DEFICIENCY, FRACTURES): Vit D, 25-Hydroxy: 74 ng/mL (ref 30–100)

## 2015-01-05 LAB — BASIC METABOLIC PANEL WITH GFR
BUN: 23 mg/dL (ref 7–25)
CALCIUM: 9.1 mg/dL (ref 8.6–10.3)
CO2: 26 mmol/L (ref 20–31)
CREATININE: 1.21 mg/dL (ref 0.70–1.33)
Chloride: 106 mmol/L (ref 98–110)
GFR, EST AFRICAN AMERICAN: 75 mL/min (ref >=60)
GFR, EST NON AFRICAN AMERICAN: 65 mL/min (ref >=60)
Glucose, Bld: 87 mg/dL (ref 65–99)
Potassium: 3.9 mmol/L (ref 3.5–5.3)
SODIUM: 139 mmol/L (ref 135–146)

## 2015-01-05 LAB — HEMOGLOBIN A1C
HEMOGLOBIN A1C: 6 % — AB (ref <5.7)
MEAN PLASMA GLUCOSE: 126 mg/dL — AB (ref <117)

## 2015-01-05 LAB — LIPID PANEL
CHOL/HDL RATIO: 4.3 ratio (ref <=5.0)
Cholesterol: 154 mg/dL (ref 125–200)
HDL: 36 mg/dL — ABNORMAL LOW (ref >=40)
LDL Cholesterol: 96 mg/dL (ref <130)
TRIGLYCERIDES: 110 mg/dL (ref <150)
VLDL: 22 mg/dL (ref <30)

## 2015-01-05 LAB — MAGNESIUM: MAGNESIUM: 2 mg/dL (ref 1.5–2.5)

## 2015-01-05 LAB — HEPATIC FUNCTION PANEL
ALT: 21 U/L (ref 9–46)
AST: 23 U/L (ref 10–35)
Albumin: 4.1 g/dL (ref 3.6–5.1)
Alkaline Phosphatase: 41 U/L (ref 40–115)
BILIRUBIN DIRECT: 0.2 mg/dL (ref <=0.2)
BILIRUBIN INDIRECT: 0.8 mg/dL (ref 0.2–1.2)
Total Bilirubin: 1 mg/dL (ref 0.2–1.2)
Total Protein: 6.6 g/dL (ref 6.1–8.1)

## 2015-01-05 LAB — INSULIN, RANDOM: INSULIN: 23.3 u[IU]/mL — AB (ref 2.0–19.6)

## 2015-01-05 LAB — TSH: TSH: 1.515 u[IU]/mL (ref 0.350–4.500)

## 2015-05-30 ENCOUNTER — Encounter: Payer: Self-pay | Admitting: Internal Medicine

## 2015-07-05 ENCOUNTER — Encounter: Payer: Self-pay | Admitting: Internal Medicine

## 2015-08-18 ENCOUNTER — Ambulatory Visit (INDEPENDENT_AMBULATORY_CARE_PROVIDER_SITE_OTHER): Payer: Self-pay | Admitting: Internal Medicine

## 2015-08-18 ENCOUNTER — Encounter: Payer: Self-pay | Admitting: Internal Medicine

## 2015-08-18 VITALS — BP 142/98 | HR 76 | Temp 97.2°F | Resp 16 | Ht 68.5 in | Wt 244.0 lb

## 2015-08-18 DIAGNOSIS — I10 Essential (primary) hypertension: Secondary | ICD-10-CM | POA: Diagnosis not present

## 2015-08-18 DIAGNOSIS — Z111 Encounter for screening for respiratory tuberculosis: Secondary | ICD-10-CM | POA: Diagnosis not present

## 2015-08-18 DIAGNOSIS — E559 Vitamin D deficiency, unspecified: Secondary | ICD-10-CM

## 2015-08-18 DIAGNOSIS — E662 Morbid (severe) obesity with alveolar hypoventilation: Secondary | ICD-10-CM

## 2015-08-18 DIAGNOSIS — R7303 Prediabetes: Secondary | ICD-10-CM

## 2015-08-18 DIAGNOSIS — E782 Mixed hyperlipidemia: Secondary | ICD-10-CM

## 2015-08-18 DIAGNOSIS — Z136 Encounter for screening for cardiovascular disorders: Secondary | ICD-10-CM

## 2015-08-18 DIAGNOSIS — Z1212 Encounter for screening for malignant neoplasm of rectum: Secondary | ICD-10-CM

## 2015-08-18 DIAGNOSIS — Z0000 Encounter for general adult medical examination without abnormal findings: Secondary | ICD-10-CM

## 2015-08-18 DIAGNOSIS — Z0001 Encounter for general adult medical examination with abnormal findings: Secondary | ICD-10-CM

## 2015-08-18 DIAGNOSIS — Z125 Encounter for screening for malignant neoplasm of prostate: Secondary | ICD-10-CM

## 2015-08-18 DIAGNOSIS — Z23 Encounter for immunization: Secondary | ICD-10-CM

## 2015-08-18 DIAGNOSIS — R5383 Other fatigue: Secondary | ICD-10-CM

## 2015-08-18 DIAGNOSIS — Z79899 Other long term (current) drug therapy: Secondary | ICD-10-CM

## 2015-08-18 NOTE — Progress Notes (Signed)
Patient ID: Gabriel Livingston, male   DOB: [birth year], 60 y.o.   MRN: [id number]   [facility] ADULT & ADOLESCENT INTERNAL MEDICINE   Unk Pinto, M.D.    Uvaldo Bristle. Silverio Lay, P.A.-C      Starlyn Skeans, P.A.-C   [facility]                [address] [place], [place] [id number] Telephone [phone] Telefax [phone] _________________________________  Annual  Screening/Preventative Visit And Comprehensive Evaluation & Examination      This very nice 60 y.o. MBM presents for a Wellness/Preventative Visit & comprehensive evaluation and management of multiple medical co-morbidities.  Patient has been followed for HTN, Prediabetes, Hyperlipidemia and Vitamin D Deficiency.Apparent patient was self d/c'd his Atenolol & Pravastatin sometime over the lasr 6-12 (?) months for unclear reasons.       HTN predates since 2005. Patient's BP has been controlled at home.Today's BP is not at goal and elevated of of meds  at 142/98. Patient denies any cardiac symptoms as chest pain, palpitations, shortness of breath, dizziness or ankle swelling.      Patient's hyperlipidemia is controlled with diet and medications. Patient denies myalgias or other medication SE's. Last lipids were at goal with Cholesterol 154; HDL 36*; LDL 96; Triglycerides 110 on 01/04/2015.      Patient has prediabetes since since 2011 with A1c 6.4% and which hehas and patient denies reactive hypoglycemic symptoms, visual blurring, diabetic polys or paresthesias. Patient is attempting dietary management. Last A1c was 6.0% on 01/04/2015.        Finally, patient has history of Vitamin D Deficiency of "75" in 2008 and last vitamin D was 74 on 01/04/2015.  Medication Sig  . atenolol  100 MG Take 100 mg by mouth daily. 1/2 qd - Off   . MultiVit-Minerals  Take 1 tablet by mouth daily.  . phentermine37.5 MG  TAKE 1/2-1 TABLET EVERY MORNING  . pravastatin  40 MG  TAKE 1 TABLET AT BEDTIME -  Off   No Known Allergies   Past Medical History  Diagnosis Date  . Hypertension   . Hyperlipidemia   . Other abnormal glucose   . Hypogonadism male   . Unspecified vitamin D deficiency   . Sleep apnea     mild, no cpap   Health Maintenance  Topic Date Due  . TETANUS/TDAP  04/03/2015  . INFLUENZA VACCINE  11/01/2015  . COLONOSCOPY  01/12/2019  . Hepatitis C Screening  Completed  . HIV Screening  Completed   Immunization History  Administered Date(s) Administered  . PPD Test 05/06/2013, 05/07/2014  . Td 04/02/2005   Past Surgical History  Procedure Laterality Date  . Colonoscopy    . Polypectomy     Family History  Problem Relation Age of Onset  . Heart disease Mother   . Hyperlipidemia Mother   . Hypertension Mother   . Cancer Father     lung, prostate  . Colon cancer Neg Hx    Social History   Social History  . Marital Status: Married    Spouse Name: N/A  . Number of Children: N/A  . Years of Education: N/A   Occupational History  . 13 yrs w/[employer]   Social History Main Topics  . Smoking status: Never Smoker   . Smokeless tobacco: Never Used  . Alcohol Use: No  .  Drug Use: No  . Sexual Activity: Active    ROS Constitutional: Denies fever, chills, weight loss/gain, headaches, insomnia,  night sweats or change in appetite. Does c/o fatigue. Eyes: Denies redness, blurred vision, diplopia, discharge, itchy or watery eyes.  ENT: Denies discharge, congestion, post nasal drip, epistaxis, sore throat, earache, hearing loss, dental pain, Tinnitus, Vertigo, Sinus pain or snoring.  Cardio: Denies chest pain, palpitations, irregular heartbeat, syncope, dyspnea, diaphoresis, orthopnea, PND, claudication or edema Respiratory: denies cough, dyspnea, DOE, pleurisy, hoarseness, laryngitis or wheezing.  Gastrointestinal: Denies dysphagia, heartburn, reflux, water brash, pain, cramps, nausea, vomiting, bloating, diarrhea, constipation,  hematemesis, melena, hematochezia, jaundice or hemorrhoids Genitourinary: Denies dysuria, frequency, urgency, nocturia, hesitancy, discharge, hematuria or flank pain Musculoskeletal: Denies arthralgia, myalgia, stiffness, Jt. Swelling, pain, limp or strain/sprain. Denies Falls. Skin: Denies puritis, rash, hives, warts, acne, eczema or change in skin lesion Neuro: No weakness, tremor, incoordination, spasms, paresthesia or pain Psychiatric: Denies confusion, memory loss or sensory loss. Denies Depression. Endocrine: Denies change in weight, skin, hair change, nocturia, and paresthesia, diabetic polys, visual blurring or hyper / hypo glycemic episodes.  Heme/Lymph: No excessive bleeding, bruising or enlarged lymph nodes.  Physical Exam  BP 142/98 mmHg  Pulse 76  Temp(Src) 97.2 F (36.2 C)  Resp 16  Ht 5' 8.5" (1.74 m)  Wt 244 lb (110.678 kg)  BMI 36.56 kg/m2  General Appearance: Over nourished and  in no apparent distress. Eyes: PERRLA, EOMs, conjunctiva no swelling or erythema, normal fundi and vessels. Sinuses: No frontal/maxillary tenderness ENT/Mouth: EACs patent / TMs  nl. Nares clear without erythema, swelling, mucoid exudates. Oral hygiene is good. No erythema, swelling, or exudate. Tongue normal, non-obstructing. Tonsils not swollen or erythematous. Hearing normal.  Neck: Supple, thyroid normal. No bruits, nodes or JVD. Respiratory: Respiratory effort normal.  BS equal and clear bilateral without rales, rhonci, wheezing or stridor. Cardio: Heart sounds are normal with regular rate and rhythm and no murmurs, rubs or gallops. Peripheral pulses are normal and equal bilaterally without edema. No aortic or femoral bruits. Chest: symmetric with normal excursions and percussion.  Abdomen: Soft, with Nl bowel sounds. Nontender, no guarding, rebound, hernias, masses, or organomegaly.  Lymphatics: Non tender without lymphadenopathy.  Genitourinary: No hernias.Testes nl. DRE - prostate nl  for age - smooth & firm w/o nodules. Musculoskeletal: Full ROM all peripheral extremities, joint stability, 5/5 strength, and normal gait. Skin: Warm and dry without rashes, lesions, cyanosis, clubbing or  ecchymosis.  Neuro: Cranial nerves intact, reflexes equal bilaterally. Normal muscle tone, no cerebellar symptoms. Sensation intact.  Pysch: Alert and oriented X 3 with normal affect, insight and judgment appropriate.   Assessment and Plan  1. Annual Preventative/Screening Exam    - Microalbumin / creatinine urine ratio - EKG 12-Lead - [place], RETROPERITNL ABD,  LTD - POC Hemoccult Bld/Stl  - Urinalysis, Routine w reflex microscopic  - Vitamin B12 - Iron and TIBC - PSA - Testosterone - CBC with Differential/Platelet - BASIC METABOLIC PANEL WITH GFR - Hepatic function panel - Magnesium - Lipid panel - TSH - Hemoglobin A1c - Insulin, random - VITAMIN D 25 Hydroxy   2. Essential hypertension  - Microalbumin / creatinine urine ratio - EKG 12-Lead - [place], RETROPERITNL ABD,  LTD - TSH  3. Hyperlipidemia  - Lipid panel - TSH  4. Prediabetes  - Hemoglobin A1c - Insulin, random  5. Vitamin D deficiency  - VITAMIN D 25 Hydroxy   6. Morbid obesity with alveolar hypoventilation ([place])   7. Screening  for rectal cancer  - POC Hemoccult Bld/Stl   8. Prostate cancer screening  - PSA  9. Other fatigue  - Vitamin B12 - Iron and TIBC - Testosterone - CBC with Differential/Platelet - TSH  10. Medication management  - Urinalysis, Routine w reflex microscopic  - CBC with Differential/Platelet - BASIC METABOLIC PANEL WITH GFR - Hepatic function panel - Magnesium  11. Screening for ischemic heart disease   12. Screening for AAA (aortic abdominal aneurysm)   Continue prudent diet as discussed, weight control, BP monitoring, regular exercise, and medications as discussed.  Discussed med effects and SE's. Routine screening labs and tests as requested with regular  follow-up as recommended. Over 40 minutes of exam, counseling, chart review and high complex critical decision making was performed

## 2015-08-18 NOTE — Patient Instructions (Signed)

## 2015-08-19 ENCOUNTER — Encounter: Payer: Self-pay | Admitting: Internal Medicine

## 2015-08-19 LAB — BASIC METABOLIC PANEL WITH GFR
BUN: 18 mg/dL (ref 7–25)
CHLORIDE: 105 mmol/L (ref 98–110)
CO2: 24 mmol/L (ref 20–31)
CREATININE: 1.12 mg/dL (ref 0.70–1.33)
Calcium: 9.1 mg/dL (ref 8.6–10.3)
GFR, Est African American: 83 mL/min (ref >=60)
GFR, Est Non African American: 72 mL/min (ref >=60)
Glucose, Bld: 87 mg/dL (ref 65–99)
POTASSIUM: 3.9 mmol/L (ref 3.5–5.3)
Sodium: 138 mmol/L (ref 135–146)

## 2015-08-19 LAB — URINALYSIS, ROUTINE W REFLEX MICROSCOPIC
BILIRUBIN URINE: NEGATIVE
GLUCOSE, UA: NEGATIVE
KETONES UR: NEGATIVE
Leukocytes, UA: NEGATIVE
NITRITE: NEGATIVE
PH: 6 (ref 5.0–8.0)
Protein, ur: NEGATIVE
SPECIFIC GRAVITY, URINE: 1.025 (ref 1.001–1.035)

## 2015-08-19 LAB — LIPID PANEL
CHOL/HDL RATIO: 4.4 ratio (ref <=5.0)
Cholesterol: 194 mg/dL (ref 125–200)
HDL: 44 mg/dL (ref >=40)
LDL CALC: 127 mg/dL (ref <130)
Triglycerides: 115 mg/dL (ref <150)
VLDL: 23 mg/dL (ref <30)

## 2015-08-19 LAB — HEMOGLOBIN A1C
HEMOGLOBIN A1C: 6.3 % — AB (ref <5.7)
Mean Plasma Glucose: 134 mg/dL

## 2015-08-19 LAB — HEPATIC FUNCTION PANEL
ALBUMIN: 4.1 g/dL (ref 3.6–5.1)
ALK PHOS: 51 U/L (ref 40–115)
ALT: 22 U/L (ref 9–46)
AST: 23 U/L (ref 10–35)
BILIRUBIN TOTAL: 0.9 mg/dL (ref 0.2–1.2)
Bilirubin, Direct: 0.1 mg/dL (ref <=0.2)
Indirect Bilirubin: 0.8 mg/dL (ref 0.2–1.2)
Total Protein: 7.1 g/dL (ref 6.1–8.1)

## 2015-08-19 LAB — INSULIN, RANDOM: INSULIN: 28.6 u[IU]/mL — AB (ref 2.0–19.6)

## 2015-08-19 LAB — URINALYSIS, MICROSCOPIC ONLY
BACTERIA UA: NONE SEEN [HPF]
CRYSTALS: NONE SEEN [HPF]
Casts: NONE SEEN [LPF]
RBC / HPF: NONE SEEN RBC/HPF (ref <=2)
Squamous Epithelial / LPF: NONE SEEN [HPF] (ref <=5)
WBC UA: NONE SEEN WBC/HPF (ref <=5)
Yeast: NONE SEEN [HPF]

## 2015-08-19 LAB — TESTOSTERONE: Testosterone: 203 ng/dL — ABNORMAL LOW (ref 250–827)

## 2015-08-19 LAB — MICROALBUMIN / CREATININE URINE RATIO
CREATININE, URINE: 250 mg/dL (ref 20–370)
MICROALB UR: 0.8 mg/dL
Microalb Creat Ratio: 3 mcg/mg creat (ref <30)

## 2015-08-19 LAB — CBC WITH DIFFERENTIAL/PLATELET
BASOS ABS: 55 {cells}/uL (ref 0–200)
BASOS PCT: 1 %
EOS ABS: 165 {cells}/uL (ref 15–500)
Eosinophils Relative: 3 %
HEMATOCRIT: 40.3 % (ref 38.5–50.0)
HEMOGLOBIN: 13.3 g/dL (ref 13.2–17.1)
LYMPHS ABS: 2805 {cells}/uL (ref 850–3900)
Lymphocytes Relative: 51 %
MCH: 28.7 pg (ref 27.0–33.0)
MCHC: 33 g/dL (ref 32.0–36.0)
MCV: 86.9 fL (ref 80.0–100.0)
MONO ABS: 385 {cells}/uL (ref 200–950)
MONOS PCT: 7 %
MPV: 9.7 fL (ref 7.5–12.5)
NEUTROS ABS: 2090 {cells}/uL (ref 1500–7800)
Neutrophils Relative %: 38 %
PLATELETS: 208 10*3/uL (ref 140–400)
RBC: 4.64 MIL/uL (ref 4.20–5.80)
RDW: 14.5 % (ref 11.0–15.0)
WBC: 5.5 10*3/uL (ref 3.8–10.8)

## 2015-08-19 LAB — MAGNESIUM: MAGNESIUM: 2.1 mg/dL (ref 1.5–2.5)

## 2015-08-19 LAB — IRON AND TIBC
%SAT: 22 % (ref 15–60)
IRON: 69 ug/dL (ref 50–180)
TIBC: 309 ug/dL (ref 250–425)
UIBC: 240 ug/dL (ref 125–400)

## 2015-08-19 LAB — VITAMIN B12: Vitamin B-12: 459 pg/mL (ref 200–1100)

## 2015-08-19 LAB — TSH: TSH: 1.89 m[IU]/L (ref 0.40–4.50)

## 2015-08-19 LAB — PSA: PSA: 1.97 ng/mL (ref <=4.00)

## 2015-08-19 LAB — VITAMIN D 25 HYDROXY (VIT D DEFICIENCY, FRACTURES): Vit D, 25-Hydroxy: 37 ng/mL (ref 30–100)

## 2015-11-09 ENCOUNTER — Other Ambulatory Visit: Payer: Self-pay | Admitting: Internal Medicine

## 2015-11-14 ENCOUNTER — Other Ambulatory Visit: Payer: Self-pay | Admitting: Internal Medicine

## 2015-11-23 ENCOUNTER — Ambulatory Visit: Payer: Self-pay | Admitting: Internal Medicine

## 2016-01-26 ENCOUNTER — Ambulatory Visit (INDEPENDENT_AMBULATORY_CARE_PROVIDER_SITE_OTHER): Payer: Commercial Managed Care - HMO | Admitting: Physician Assistant

## 2016-01-26 VITALS — BP 142/92 | HR 80 | Temp 97.5°F | Resp 16 | Ht 68.5 in | Wt 246.0 lb

## 2016-01-26 DIAGNOSIS — R350 Frequency of micturition: Secondary | ICD-10-CM | POA: Diagnosis not present

## 2016-01-26 DIAGNOSIS — N410 Acute prostatitis: Secondary | ICD-10-CM

## 2016-01-26 MED ORDER — SULFAMETHOXAZOLE-TRIMETHOPRIM 800-160 MG PO TABS: 1.0000 | ORAL_TABLET | Freq: Two times a day (BID) | ORAL | 0 refills | Status: DC

## 2016-01-26 MED ORDER — DOXAZOSIN MESYLATE 8 MG PO TABS: 8.0000 mg | ORAL_TABLET | Freq: Every day | ORAL | 3 refills | Status: DC

## 2016-01-26 NOTE — Progress Notes (Signed)
[place] ADULT & ADOLESCENT INTERNAL MEDICINE   Unk Pinto, M.D.    Uvaldo Bristle. Silverio Lay, P.A.-C      Starlyn Skeans, P.A.-C  [facility]                [address] [place] SSN-[id number] Telephone [phone] Telefax [phone]  Subjective:    Patient ID: Gabriel Livingston, male    DOB: 1955/12/07, 60 y.o.   MRN: [id number]  HPI This very nice 60 yo MBM presents with frequent urination x 3-4 years, getting progressively worse. He is getting up ever hour or 2 at night, and today he has been having some increased frequency. Will have weak stream/hesitancy/dribbling if he holds it for a long time.  He denies flank pain but does have lower back pain. No fever, chills, hematuria, dysruia, ED.   Lab Results  Component Value Date   PSA 1.97 08/18/2015   PSA 2.16 05/06/2014   PSA 1.56 05/06/2013   Blood pressure (!) 142/92, pulse 80, temperature 97.5 F (36.4 C), resp. rate 16, height 5' 8.5" (1.74 m), weight 246 lb (111.6 kg).  Medications Current Outpatient Prescriptions on File Prior to Visit  Medication Sig  . atenolol (TENORMIN) 100 MG tablet Take 100 mg by mouth daily. Reported on 08/18/2015  . Multiple Vitamins-Minerals (MULTIVITAMIN WITH MINERALS) tablet Take 1 tablet by mouth daily.  . phentermine (ADIPEX-P) 37.5 MG tablet TAKE 1/2-1 TABLET EVERY MORNING (Patient not taking: Reported on 01/26/2016)  . pravastatin (PRAVACHOL) 40 MG tablet TAKE 1 TABLET AT BEDTIME (Patient not taking: Reported on 01/26/2016)  . Vitamin D, Ergocalciferol, (DRISDOL) 50000 units CAPS capsule TAKE ONE CAPSULE BY MOUTH EVERY DAY (Patient not taking: Reported on 01/26/2016)   No current facility-administered medications on file prior to visit.     Problem list He has Essential hypertension; Hyperlipidemia; Vitamin D deficiency; Prediabetes; Medication management; Morbid obesity (HCC)  (BMI 34.85); and BMI 34.85,adult on his problem  list.   Review of Systems  10 point systems review negative except as above.    Objective:   Physical Exam  Constitutional: He is oriented to person, place, and time. He appears well-developed and well-nourished.  HENT:  Head: Normocephalic and atraumatic.  Right Ear: External ear normal.  Left Ear: External ear normal.  Mouth/Throat: Oropharynx is clear and moist.  Eyes: Conjunctivae and EOM are normal. Pupils are equal, round, and reactive to light.  Neck: Normal range of motion. Neck supple.  Cardiovascular: Normal rate, regular rhythm and normal heart sounds.   Pulmonary/Chest: Effort normal and breath sounds normal.  Abdominal: Soft. Bowel sounds are normal. There is tenderness.  Musculoskeletal: Normal range of motion. He exhibits tenderness (Right CVA).  Neurological: He is alert and oriented to person, place, and time. No cranial nerve deficit.  Skin: Skin is warm and dry.  Psychiatric: He has a normal mood and affect. His behavior is normal.       Assessment & Plan:  Urinary frequency Keep follow up with Dr. Melford Aase in 1 month ? Need sleep study - Urinalysis, Routine w reflex microscopic (not at [facility]) - Urine culture - sulfamethoxazole-trimethoprim (BACTRIM DS,SEPTRA DS) 800-160 MG tablet; Take 1 tablet by mouth 2 (two) times daily.  Dispense: 30 tablet; Refill: 0 - doxazosin (CARDURA) 8 MG tablet; Take 1 tablet (8 mg total) by mouth daily.  Dispense: 30 tablet; Refill: 3  Future Appointments  Date Time Provider [place]  02/29/2016 4:15 PM Unk Pinto, MD [facility] None  10/04/2016 3:00 PM Unk Pinto, MD [facility] None

## 2016-01-26 NOTE — Patient Instructions (Addendum)
Start 1/4 of the doxazosin at night before bed for 1 week, then do 1/2 of the doxazosin at night for 1 week. Stay on the doxazosin or increase to 1 pill. If you get dizzy with the doxazosin go back to the previous dose. This medication can cause hypotension so be careful with standing in the beginning and decrease the dose if you get dizzy. Please drink plenty of fluids.      Prostatitis The prostate gland is about the size and shape of a walnut. It is located just below your bladder. It produces one of the components of semen, which is made up of sperm and the fluids that help nourish and transport it out from the testicles. Prostatitis is inflammation of the prostate gland.  There are four types of prostatitis:  Acute bacterial prostatitis. This is the least common type of prostatitis. It starts quickly and usually is associated with a bladder infection, high fever, and shaking chills. It can occur at any age.  Chronic bacterial prostatitis. This is a persistent bacterial infection in the prostate. It usually develops from repeated acute bacterial prostatitis or acute bacterial prostatitis that was not properly treated. It can occur in men of any age but is most common in middle-aged men whose prostate has begun to enlarge. The symptoms are not as severe as those in acute bacterial prostatitis. Discomfort in the part of your body that is in front of your rectum and below your scrotum (perineum), lower abdomen, or in the head of your penis (glans) may represent your primary discomfort.  Chronic prostatitis (nonbacterial). This is the most common type of prostatitis. It is inflammation of the prostate gland that is not caused by a bacterial infection. The cause is unknown and may be associated with a viral infection or autoimmune disorder.  Prostatodynia (pelvic floor disorder). This is associated with increased muscular tone in the pelvis surrounding the prostate. CAUSES The causes of bacterial  prostatitis are bacterial infection. The causes of the other types of prostatitis are unknown.  SYMPTOMS  Symptoms can vary depending upon the type of prostatitis that exists. There can also be overlap in symptoms. Possible symptoms for each type of prostatitis are listed below. Acute Bacterial Prostatitis  Painful urination.  Fever or chills.  Muscle or joint pains.  Low back pain.  Low abdominal pain.  Inability to empty bladder completely. Chronic Bacterial Prostatitis, Chronic Nonbacterial Prostatitis, and Prostatodynia  Sudden urge to urinate.  Frequent urination.  Difficulty starting urine stream.  Weak urine stream.  Discharge from the urethra.  Dribbling after urination.  Rectal pain.  Pain in the testicles, penis, or tip of the penis.  Pain in the perineum.  Problems with sexual function.  Painful ejaculation.  Bloody semen. DIAGNOSIS  In order to diagnose prostatitis, your health care provider will ask about your symptoms. One or more urine samples will be taken and tested (urinalysis). If the urinalysis result is negative for bacteria, your health care provider may use a finger to feel your prostate (digital rectal exam). This exam helps your health care provider determine if your prostate is swollen and tender. It will also produce a specimen of semen that can be analyzed. TREATMENT  Treatment for prostatitis depends on the cause. If a bacterial infection is the cause, it can be treated with antibiotic medicine. In cases of chronic bacterial prostatitis, the use of antibiotics for up to 1 month or 6 weeks may be necessary. Your health care provider may instruct you  to take sitz baths to help relieve pain. A sitz bath is a bath of hot water in which your hips and buttocks are under water. This relaxes the pelvic floor muscles and often helps to relieve the pressure on your prostate. HOME CARE INSTRUCTIONS   Take all medicines as directed by your health care  provider.  Take sitz baths as directed by your health care provider. SEEK MEDICAL CARE IF:   Your symptoms get worse, not better.  You have a fever. SEEK IMMEDIATE MEDICAL CARE IF:   You have chills.  You feel nauseous or vomit.  You feel lightheaded or faint.  You are unable to urinate.  You have blood or blood clots in your urine. MAKE SURE YOU:  Understand these instructions.  Will watch your condition.  Will get help right away if you are not doing well or get worse.   This information is not intended to replace advice given to you by your health care provider. Make sure you discuss any questions you have with your health care provider.   Document Released: 03/16/2000 Document Revised: 04/09/2014 Document Reviewed: 10/06/2012 Elsevier Interactive Patient Education [employer].

## 2016-01-27 LAB — URINALYSIS, ROUTINE W REFLEX MICROSCOPIC
BILIRUBIN URINE: NEGATIVE
GLUCOSE, UA: NEGATIVE
Hgb urine dipstick: NEGATIVE
KETONES UR: NEGATIVE
Leukocytes, UA: NEGATIVE
Nitrite: NEGATIVE
PROTEIN: NEGATIVE
Specific Gravity, Urine: 1.01 (ref 1.001–1.035)
pH: 6 (ref 5.0–8.0)

## 2016-01-27 LAB — URINE CULTURE

## 2016-02-29 ENCOUNTER — Ambulatory Visit: Payer: Self-pay | Admitting: Internal Medicine

## 2016-03-06 ENCOUNTER — Telehealth: Payer: Self-pay | Admitting: Physician Assistant

## 2016-03-06 NOTE — Telephone Encounter (Signed)
Informed pt of samples that he could pick up  Pt agreed & states he will pick them today lunch time.

## 2016-03-06 NOTE — Telephone Encounter (Signed)
Will leave myrbetriq samples for the patient to try for urinary frequency, had normal urine, has tried doxazosin and bactrim without help. Has follow up with Dr. Melford Aase

## 2016-04-03 ENCOUNTER — Other Ambulatory Visit: Payer: Self-pay | Admitting: Internal Medicine

## 2016-04-03 ENCOUNTER — Other Ambulatory Visit: Payer: Self-pay | Admitting: Physician Assistant

## 2016-04-03 MED ORDER — OXYBUTYNIN CHLORIDE 5 MG PO TABS: ORAL_TABLET | ORAL | 1 refills | Status: DC

## 2016-04-24 ENCOUNTER — Telehealth: Payer: Self-pay | Admitting: *Deleted

## 2016-04-24 NOTE — Telephone Encounter (Signed)
Patient called and asked if Oxybutnin can cause ED. Per Dr Melford Aase, these types of medication can cause ED.  Patient is aware and will decide if he wants to continue the [name].

## 2016-05-01 ENCOUNTER — Other Ambulatory Visit: Payer: Self-pay | Admitting: *Deleted

## 2016-05-01 MED ORDER — SILDENAFIL CITRATE 20 MG PO TABS: ORAL_TABLET | ORAL | 0 refills | Status: DC

## 2016-05-07 ENCOUNTER — Other Ambulatory Visit: Payer: Self-pay | Admitting: Physician Assistant

## 2016-05-07 MED ORDER — SOLIFENACIN SUCCINATE 10 MG PO TABS: 10.0000 mg | ORAL_TABLET | Freq: Every day | ORAL | 0 refills | Status: DC

## 2016-05-07 NOTE — Progress Notes (Signed)
Will give samples of vesicare given to his wife to try

## 2016-05-16 ENCOUNTER — Ambulatory Visit: Payer: Self-pay | Admitting: Internal Medicine

## 2016-06-03 ENCOUNTER — Other Ambulatory Visit: Payer: Self-pay | Admitting: Internal Medicine

## 2016-06-04 ENCOUNTER — Other Ambulatory Visit: Payer: Self-pay | Admitting: Internal Medicine

## 2016-06-20 ENCOUNTER — Ambulatory Visit (INDEPENDENT_AMBULATORY_CARE_PROVIDER_SITE_OTHER): Payer: Commercial Managed Care - HMO | Admitting: Internal Medicine

## 2016-06-20 ENCOUNTER — Encounter: Payer: Self-pay | Admitting: Internal Medicine

## 2016-06-20 VITALS — BP 132/90 | HR 76 | Temp 97.3°F | Resp 16 | Ht 68.5 in | Wt 250.8 lb

## 2016-06-20 DIAGNOSIS — R7303 Prediabetes: Secondary | ICD-10-CM

## 2016-06-20 DIAGNOSIS — Z79899 Other long term (current) drug therapy: Secondary | ICD-10-CM

## 2016-06-20 DIAGNOSIS — E559 Vitamin D deficiency, unspecified: Secondary | ICD-10-CM

## 2016-06-20 DIAGNOSIS — I10 Essential (primary) hypertension: Secondary | ICD-10-CM

## 2016-06-20 DIAGNOSIS — E782 Mixed hyperlipidemia: Secondary | ICD-10-CM | POA: Diagnosis not present

## 2016-06-20 LAB — CBC WITH DIFFERENTIAL/PLATELET
BASOS PCT: 0 %
Basophils Absolute: 0 cells/uL (ref 0–200)
EOS ABS: 204 {cells}/uL (ref 15–500)
Eosinophils Relative: 3 %
HCT: 39.9 % (ref 38.5–50.0)
HEMOGLOBIN: 13.1 g/dL — AB (ref 13.2–17.1)
LYMPHS ABS: 2312 {cells}/uL (ref 850–3900)
LYMPHS PCT: 34 %
MCH: 29 pg (ref 27.0–33.0)
MCHC: 32.8 g/dL (ref 32.0–36.0)
MCV: 88.3 fL (ref 80.0–100.0)
MONO ABS: 544 {cells}/uL (ref 200–950)
MPV: 9.5 fL (ref 7.5–12.5)
Monocytes Relative: 8 %
NEUTROS ABS: 3740 {cells}/uL (ref 1500–7800)
Neutrophils Relative %: 55 %
PLATELETS: 175 10*3/uL (ref 140–400)
RBC: 4.52 MIL/uL (ref 4.20–5.80)
RDW: 14.2 % (ref 11.0–15.0)
WBC: 6.8 10*3/uL (ref 3.8–10.8)

## 2016-06-20 LAB — TSH: TSH: 2.89 m[IU]/L (ref 0.40–4.50)

## 2016-06-20 NOTE — Patient Instructions (Signed)

## 2016-06-20 NOTE — Progress Notes (Signed)
This very nice  MBM presents for 3 month follow up with Hypertension, Hyperlipidemia, Pre-Diabetes and Vitamin D Deficiency.      Patient is treated for HTN (2005) & BP has been controlled at home. Today's BP is at goal -  132/90. Patient has had no complaints of any cardiac type chest pain, palpitations, dyspnea/orthopnea/PND, dizziness, claudication, or dependent edema.     Hyperlipidemia is not controlled with diet & meds. Patient denies myalgias or other med SE's. Last Lipids were not at goal: Lab Results  Component Value Date   CHOL 194 08/18/2015   HDL 44 08/18/2015   LDLCALC 127 08/18/2015   TRIG 115 08/18/2015   CHOLHDL 4.4 08/18/2015      Also, the patient has history of PreDiabetes (A1c 6.4% in 2011)  and has had no symptoms of reactive hypoglycemia, diabetic polys, paresthesias or visual blurring.  Patient admits very poor dietary control and over-eating and last A1c was still not at goal: Lab Results  Component Value Date   HGBA1C 6.3 (H) 08/18/2015      Further, the patient also has history of Vitamin D Deficiency ("19" in 2008) and  he does not supplement as recommended. Last vitamin D was still low: Lab Results  Component Value Date   VD25OH 37 08/18/2015   Current Outpatient Prescriptions on File Prior to Visit  Medication Sig  . doxazosin (CARDURA) 8 MG tablet Take 1 tablet (8 mg total) by mouth daily.  Marland Kitchen oxybutynin (DITROPAN) 5 MG tablet Take 1/2 to 1 tablet 2 or 3 x / day for bladder control  . pravastatin (PRAVACHOL) 40 MG tablet TAKE 1 TABLET AT BEDTIME  . sildenafil (REVATIO) 20 MG tablet Take 2 to 5 tablets 1 hour before needed PRN.  Marland Kitchen Vitamin D, Ergocalciferol, (DRISDOL) 50000 units CAPS capsule TAKE ONE CAPSULE BY MOUTH EVERY DAY  . Multiple Vitamins-Minerals (MULTIVITAMIN WITH MINERALS) tablet Take 1 tablet by mouth daily.  . phentermine (ADIPEX-P) 37.5 MG tablet TAKE 1/2-1 TABLET EVERY MORNING (Patient not taking: Reported on 06/20/2016)   No current  facility-administered medications on file prior to visit.    No Known Allergies PMHx:   Past Medical History:  Diagnosis Date  . Hyperlipidemia   . Hypertension   . Hypogonadism male   . Other abnormal glucose   . Sleep apnea    mild, no cpap  . Unspecified vitamin D deficiency    Immunization History  Administered Date(s) Administered  . PPD Test 05/06/2013, 05/07/2014, 08/18/2015  . Td 04/02/2005  . Tdap 08/18/2015   Past Surgical History:  Procedure Laterality Date  . COLONOSCOPY    . POLYPECTOMY     FHx:    Reviewed / unchanged  SHx:    Reviewed / unchanged  Systems Review:  Constitutional: Denies fever, chills, wt changes, headaches, insomnia, fatigue, night sweats, change in appetite. Eyes: Denies redness, blurred vision, diplopia, discharge, itchy, watery eyes.  ENT: Denies discharge, congestion, post nasal drip, epistaxis, sore throat, earache, hearing loss, dental pain, tinnitus, vertigo, sinus pain, snoring.  CV: Denies chest pain, palpitations, irregular heartbeat, syncope, dyspnea, diaphoresis, orthopnea, PND, claudication or edema. Respiratory: denies cough, dyspnea, DOE, pleurisy, hoarseness, laryngitis, wheezing.  Gastrointestinal: Denies dysphagia, odynophagia, heartburn, reflux, water brash, abdominal pain or cramps, nausea, vomiting, bloating, diarrhea, constipation, hematemesis, melena, hematochezia  or hemorrhoids. Genitourinary: Denies dysuria, frequency, urgency, nocturia, hesitancy, discharge, hematuria or flank pain. Musculoskeletal: Denies arthralgias, myalgias, stiffness, jt. swelling, pain, limping or strain/sprain.  Skin: Denies  pruritus, rash, hives, warts, acne, eczema or change in skin lesion(s). Neuro: No weakness, tremor, incoordination, spasms, paresthesia or pain. Psychiatric: Denies confusion, memory loss or sensory loss. Endo: Denies change in weight, skin or hair change.  Heme/Lymph: No excessive bleeding, bruising or enlarged lymph  nodes.  Physical Exam  BP 132/90   Pulse 76   Temp 97.3 F (36.3 C)   Resp 16   Ht 5' 8.5" (1.74 m)   Wt 250 lb 12.8 oz (113.8 kg)   BMI 37.58 kg/m   Appears well nourished and in no distress.  Eyes: PERRLA, EOMs, conjunctiva no swelling or erythema. Sinuses: No frontal/maxillary tenderness ENT/Mouth: EAC's clear, TM's nl w/o erythema, bulging. Nares clear w/o erythema, swelling, exudates. Oropharynx clear without erythema or exudates. Oral hygiene is good. Tongue normal, non obstructing. Hearing intact.  Neck: Supple. Thyroid nl. Car 2+/2+ without bruits, nodes or JVD. Chest: Respirations nl with BS clear & equal w/o rales, rhonchi, wheezing or stridor.  Cor: Heart sounds normal w/ regular rate and rhythm without sig. murmurs, gallops, clicks, or rubs. Peripheral pulses normal and equal  without edema.  Abdomen: Soft & bowel sounds normal. Non-tender w/o guarding, rebound, hernias, masses, or organomegaly.  Lymphatics: Unremarkable.  Musculoskeletal: Full ROM all peripheral extremities, joint stability, 5/5 strength, and normal gait.  Skin: Warm, dry without exposed rashes, lesions or ecchymosis apparent.  Neuro: Cranial nerves intact, reflexes equal bilaterally. Sensory-motor testing grossly intact. Tendon reflexes grossly intact.  Pysch: Alert & oriented x 3.  Insight and judgement nl & appropriate. No ideations.  Assessment and Plan:  1. Essential hypertension  - Continue medication, monitor blood pressure at home.  - Continue DASH diet. Reminder to go to the ER if any CP,  SOB, nausea, dizziness, severe HA, changes vision/speech,  left arm numbness and tingling and jaw pain.  - CBC with Differential/Platelet - BASIC METABOLIC PANEL WITH GFR - Magnesium - TSH  2. Mixed hyperlipidemia  - Continue diet/meds, exercise,& lifestyle modifications.  - Continue monitor periodic cholesterol/liver & renal functions  - Hepatic function panel - Lipid panel - TSH  3.  Prediabetes  - Continue diet, exercise, lifestyle modifications.  - Monitor appropriate labs.  - Hemoglobin A1c - Insulin, random  4. Vitamin D deficiency  - Continue supplementation.      - VITAMIN D 25 Hydroxy   5. Medication management  - CBC with Differential/Platelet - BASIC METABOLIC PANEL WITH GFR - Hepatic function panel - Magnesium - Lipid panel - TSH - Hemoglobin A1c - Insulin, random - VITAMIN D 25 Hydroxy        Recommended regular exercise, BP monitoring, weight control, and discussed med and SE's. Recommended labs to assess and monitor clinical status. Further disposition pending results of labs. Over 30 minutes of exam, counseling, chart review was performed

## 2016-06-21 ENCOUNTER — Other Ambulatory Visit: Payer: Self-pay | Admitting: Internal Medicine

## 2016-06-21 LAB — LIPID PANEL
Cholesterol: 186 mg/dL (ref <200)
HDL: 38 mg/dL — AB (ref >40)
LDL CALC: 114 mg/dL — AB (ref <100)
Total CHOL/HDL Ratio: 4.9 Ratio (ref <5.0)
Triglycerides: 168 mg/dL — ABNORMAL HIGH (ref <150)
VLDL: 34 mg/dL — AB (ref <30)

## 2016-06-21 LAB — BASIC METABOLIC PANEL WITH GFR
BUN: 17 mg/dL (ref 7–25)
CHLORIDE: 107 mmol/L (ref 98–110)
CO2: 25 mmol/L (ref 20–31)
Calcium: 9.4 mg/dL (ref 8.6–10.3)
Creat: 1.38 mg/dL — ABNORMAL HIGH (ref 0.70–1.25)
GFR, Est African American: 64 mL/min (ref >=60)
GFR, Est Non African American: 55 mL/min — ABNORMAL LOW (ref >=60)
Glucose, Bld: 95 mg/dL (ref 65–99)
POTASSIUM: 3.9 mmol/L (ref 3.5–5.3)
SODIUM: 142 mmol/L (ref 135–146)

## 2016-06-21 LAB — INSULIN, RANDOM: INSULIN: 53.6 u[IU]/mL — AB (ref 2.0–19.6)

## 2016-06-21 LAB — MAGNESIUM: Magnesium: 2.1 mg/dL (ref 1.5–2.5)

## 2016-06-21 LAB — HEPATIC FUNCTION PANEL
ALK PHOS: 49 U/L (ref 40–115)
ALT: 17 U/L (ref 9–46)
AST: 17 U/L (ref 10–35)
Albumin: 3.9 g/dL (ref 3.6–5.1)
BILIRUBIN DIRECT: 0.1 mg/dL (ref <=0.2)
BILIRUBIN INDIRECT: 0.6 mg/dL (ref 0.2–1.2)
BILIRUBIN TOTAL: 0.7 mg/dL (ref 0.2–1.2)
Total Protein: 6.6 g/dL (ref 6.1–8.1)

## 2016-06-21 LAB — HEMOGLOBIN A1C
Hgb A1c MFr Bld: 5.7 % — ABNORMAL HIGH (ref <5.7)
Mean Plasma Glucose: 117 mg/dL

## 2016-06-21 MED ORDER — PREDNISONE 20 MG PO TABS: ORAL_TABLET | ORAL | 0 refills | Status: DC

## 2016-06-21 MED ORDER — TERBINAFINE HCL 250 MG PO TABS: ORAL_TABLET | ORAL | 0 refills | Status: DC

## 2016-06-23 ENCOUNTER — Other Ambulatory Visit: Payer: Self-pay | Admitting: Internal Medicine

## 2016-06-23 DIAGNOSIS — R350 Frequency of micturition: Secondary | ICD-10-CM

## 2016-06-23 MED ORDER — DOXAZOSIN MESYLATE 8 MG PO TABS: 8.0000 mg | ORAL_TABLET | Freq: Every day | ORAL | 1 refills | Status: DC

## 2016-06-23 MED ORDER — TERBINAFINE HCL 250 MG PO TABS: ORAL_TABLET | ORAL | 0 refills | Status: AC

## 2016-06-23 MED ORDER — PREDNISONE 20 MG PO TABS: ORAL_TABLET | ORAL | 0 refills | Status: DC

## 2016-06-23 MED ORDER — OXYBUTYNIN CHLORIDE 5 MG PO TABS: ORAL_TABLET | ORAL | 1 refills | Status: DC

## 2016-06-23 MED ORDER — VITAMIN D (ERGOCALCIFEROL) 1.25 MG (50000 UNIT) PO CAPS: ORAL_CAPSULE | ORAL | 1 refills | Status: DC

## 2016-06-27 ENCOUNTER — Other Ambulatory Visit: Payer: Self-pay | Admitting: *Deleted

## 2016-06-27 ENCOUNTER — Other Ambulatory Visit: Payer: Self-pay

## 2016-06-27 DIAGNOSIS — R350 Frequency of micturition: Secondary | ICD-10-CM

## 2016-06-27 MED ORDER — DOXAZOSIN MESYLATE 8 MG PO TABS: 8.0000 mg | ORAL_TABLET | Freq: Every day | ORAL | 1 refills | Status: DC

## 2016-06-27 MED ORDER — OXYBUTYNIN CHLORIDE 5 MG PO TABS: ORAL_TABLET | ORAL | 1 refills | Status: AC

## 2016-08-05 ENCOUNTER — Other Ambulatory Visit: Payer: Self-pay | Admitting: Internal Medicine

## 2016-10-04 ENCOUNTER — Ambulatory Visit (INDEPENDENT_AMBULATORY_CARE_PROVIDER_SITE_OTHER): Payer: 59 | Admitting: Internal Medicine

## 2016-10-04 ENCOUNTER — Encounter: Payer: Self-pay | Admitting: Internal Medicine

## 2016-10-04 VITALS — BP 116/78 | HR 68 | Temp 97.8°F | Resp 16 | Ht 68.5 in | Wt 241.8 lb

## 2016-10-04 DIAGNOSIS — I10 Essential (primary) hypertension: Secondary | ICD-10-CM

## 2016-10-04 DIAGNOSIS — Z1212 Encounter for screening for malignant neoplasm of rectum: Secondary | ICD-10-CM

## 2016-10-04 DIAGNOSIS — Z136 Encounter for screening for cardiovascular disorders: Secondary | ICD-10-CM | POA: Diagnosis not present

## 2016-10-04 DIAGNOSIS — R5383 Other fatigue: Secondary | ICD-10-CM

## 2016-10-04 DIAGNOSIS — Z111 Encounter for screening for respiratory tuberculosis: Secondary | ICD-10-CM | POA: Diagnosis not present

## 2016-10-04 DIAGNOSIS — Z0000 Encounter for general adult medical examination without abnormal findings: Secondary | ICD-10-CM | POA: Diagnosis not present

## 2016-10-04 DIAGNOSIS — Z79899 Other long term (current) drug therapy: Secondary | ICD-10-CM

## 2016-10-04 DIAGNOSIS — Z6837 Body mass index (BMI) 37.0-37.9, adult: Secondary | ICD-10-CM

## 2016-10-04 DIAGNOSIS — E559 Vitamin D deficiency, unspecified: Secondary | ICD-10-CM

## 2016-10-04 DIAGNOSIS — E782 Mixed hyperlipidemia: Secondary | ICD-10-CM

## 2016-10-04 DIAGNOSIS — Z0001 Encounter for general adult medical examination with abnormal findings: Secondary | ICD-10-CM

## 2016-10-04 DIAGNOSIS — Z125 Encounter for screening for malignant neoplasm of prostate: Secondary | ICD-10-CM

## 2016-10-04 DIAGNOSIS — R7303 Prediabetes: Secondary | ICD-10-CM

## 2016-10-04 LAB — BASIC METABOLIC PANEL WITH GFR
BUN: 17 mg/dL (ref 7–25)
CO2: 23 mmol/L (ref 20–31)
Calcium: 9.4 mg/dL (ref 8.6–10.3)
Chloride: 105 mmol/L (ref 98–110)
Creat: 1.41 mg/dL — ABNORMAL HIGH (ref 0.70–1.25)
GFR, EST AFRICAN AMERICAN: 62 mL/min (ref >=60)
GFR, EST NON AFRICAN AMERICAN: 54 mL/min — AB (ref >=60)
Glucose, Bld: 103 mg/dL — ABNORMAL HIGH (ref 65–99)
POTASSIUM: 3.9 mmol/L (ref 3.5–5.3)
Sodium: 141 mmol/L (ref 135–146)

## 2016-10-04 LAB — CBC WITH DIFFERENTIAL/PLATELET
BASOS PCT: 0 %
Basophils Absolute: 0 cells/uL (ref 0–200)
Eosinophils Absolute: 112 cells/uL (ref 15–500)
Eosinophils Relative: 2 %
HEMATOCRIT: 40.1 % (ref 38.5–50.0)
Hemoglobin: 13 g/dL — ABNORMAL LOW (ref 13.2–17.1)
LYMPHS ABS: 2576 {cells}/uL (ref 850–3900)
Lymphocytes Relative: 46 %
MCH: 28.6 pg (ref 27.0–33.0)
MCHC: 32.4 g/dL (ref 32.0–36.0)
MCV: 88.1 fL (ref 80.0–100.0)
MONO ABS: 336 {cells}/uL (ref 200–950)
MPV: 9.5 fL (ref 7.5–12.5)
Monocytes Relative: 6 %
NEUTROS ABS: 2576 {cells}/uL (ref 1500–7800)
Neutrophils Relative %: 46 %
Platelets: 177 10*3/uL (ref 140–400)
RBC: 4.55 MIL/uL (ref 4.20–5.80)
RDW: 14.8 % (ref 11.0–15.0)
WBC: 5.6 10*3/uL (ref 3.8–10.8)

## 2016-10-04 LAB — HEPATIC FUNCTION PANEL
ALBUMIN: 4.2 g/dL (ref 3.6–5.1)
ALK PHOS: 52 U/L (ref 40–115)
ALT: 14 U/L (ref 9–46)
AST: 17 U/L (ref 10–35)
BILIRUBIN INDIRECT: 0.8 mg/dL (ref 0.2–1.2)
BILIRUBIN TOTAL: 0.9 mg/dL (ref 0.2–1.2)
Bilirubin, Direct: 0.1 mg/dL (ref <=0.2)
Total Protein: 7 g/dL (ref 6.1–8.1)

## 2016-10-04 LAB — TSH: TSH: 1.73 mIU/L (ref 0.40–4.50)

## 2016-10-04 LAB — IRON AND TIBC
%SAT: 20 % (ref 15–60)
IRON: 60 ug/dL (ref 50–180)
TIBC: 300 ug/dL (ref 250–425)
UIBC: 240 ug/dL

## 2016-10-04 LAB — LIPID PANEL
CHOLESTEROL: 184 mg/dL (ref <200)
HDL: 38 mg/dL — AB (ref >40)
LDL CALC: 104 mg/dL — AB (ref <100)
TRIGLYCERIDES: 211 mg/dL — AB (ref <150)
Total CHOL/HDL Ratio: 4.8 Ratio (ref <5.0)
VLDL: 42 mg/dL — AB (ref <30)

## 2016-10-04 NOTE — Patient Instructions (Signed)

## 2016-10-04 NOTE — Progress Notes (Signed)
ADULT & ADOLESCENT INTERNAL MEDICINE Unk Pinto, M.D.      Gabriel Livingston. Silverio Lay, P.A.-C [facility]                [address] [place], [place] [address] Telephone [phone] Telefax [phone]  Annual Screening/Preventative Visit & Comprehensive Evaluation &  Examination     This very nice 61 y.o. MBM presents for a Screening/Preventative Visit & comprehensive evaluation and management of multiple medical co-morbidities.  Patient has been followed for HTN, Prediabetes, Hyperlipidemia and Vitamin D Deficiency.      HTN predates since 2005. Patient's BP has been controlled at home and patient denies any cardiac symptoms as chest pain, palpitations, shortness of breath, dizziness or ankle swelling. Today's BP is at goal - 116/78.      Patient's hyperlipidemia is controlled with diet and medications. Patient denies myalgias or other medication SE's. Last lipids were not at goal: Lab Results  Component Value Date   CHOL 186 06/20/2016   HDL 38 (L) 06/20/2016   LDLCALC 114 (H) 06/20/2016   TRIG 168 (H) 06/20/2016   CHOLHDL 4.9 06/20/2016      Patient has Morbid Obesity (BMI 37+) and consequent  prediabetes  (A1c 6.4% in 2011)  and patient denies reactive hypoglycemic symptoms, visual blurring, diabetic polys, or paresthesias. Last A1c was almost at goal: Lab Results  Component Value Date   HGBA1C 5.7 (H) 06/20/2016      Finally, patient has history of Vitamin D Deficiency ("19" in 2008) and he does not supplement as repeatedly recommended and consequently last Vitamin D was still very low: Lab Results  Component Value Date   VD25OH 37 08/18/2015   Current Outpatient Prescriptions on File Prior to Visit  Medication Sig  . doxazosin (CARDURA) 8 MG tablet Take 1 tablet (8 mg total) by mouth daily.  . Multiple Vitamins-Minerals  Take 1 tablet by mouth daily.  . Oxybutynin 5 MG tablet Take 1 tablet 3 x / day for bladder  control  . sildenafil 20 MG tablet TAKE 2 TO 5 TABLETS 1 HOUR BEFORE NEEDED  . Vitamin D 50,000 units CAPS Take 1 capsule daily for severe Vitamin  D Deficiency  . pravastatin  40 MG tablet TAKE 1 TABAT BEDTIME  - Patient not taking   No Known Allergies   Past Medical History:  Diagnosis Date  . Hyperlipidemia   . Hypertension   . Hypogonadism male   . Other abnormal glucose   . Sleep apnea    mild, no cpap  . Unspecified vitamin D deficiency    Health Maintenance  Topic Date Due  . INFLUENZA VACCINE  10/31/2016  . COLONOSCOPY  01/12/2019  . TETANUS/TDAP  08/17/2025  . Hepatitis C Screening  Completed  . HIV Screening  Completed   Immunization History  Administered Date(s) Administered  . PPD Test 05/06/2013, 05/07/2014, 08/18/2015, 10/04/2016  . Td 04/02/2005  . Tdap 08/18/2015   Past Surgical History:  Procedure Laterality Date  . COLONOSCOPY    . POLYPECTOMY     Family History  Problem Relation Age of Onset  . Heart disease Mother   . Hyperlipidemia Mother   . Hypertension Mother   . Cancer Father        lung, prostate  . Colon cancer Neg Hx    Social History  Substance Use Topics  . Smoking status: Never Smoker  .  Smokeless tobacco: Never Used  . Alcohol use No    ROS Constitutional: Denies fever, chills, weight loss/gain, headaches, insomnia,  night sweats, and change in appetite. Does c/o fatigue. Eyes: Denies redness, blurred vision, diplopia, discharge, itchy, watery eyes.  ENT: Denies discharge, congestion, post nasal drip, epistaxis, sore throat, earache, hearing loss, dental pain, Tinnitus, Vertigo, Sinus pain, snoring.  Cardio: Denies chest pain, palpitations, irregular heartbeat, syncope, dyspnea, diaphoresis, orthopnea, PND, claudication, edema Respiratory: denies cough, dyspnea, DOE, pleurisy, hoarseness, laryngitis, wheezing.  Gastrointestinal: Denies dysphagia, heartburn, reflux, water brash, pain, cramps, nausea, vomiting, bloating, diarrhea,  constipation, hematemesis, melena, hematochezia, jaundice, hemorrhoids Genitourinary: Denies dysuria, frequency, urgency, nocturia, hesitancy, discharge, hematuria, flank pain Breast: Breast lumps, nipple discharge, bleeding.  Musculoskeletal: Denies arthralgia, myalgia, stiffness, Jt. Swelling, pain, limp, and strain/sprain. Denies falls. Skin: Denies puritis, rash, hives, warts, acne, eczema, changing in skin lesion Neuro: No weakness, tremor, incoordination, spasms, paresthesia, pain Psychiatric: Denies confusion, memory loss, sensory loss. Denies Depression. Endocrine: Denies change in weight, skin, hair change, nocturia, and paresthesia, diabetic polys, visual blurring, hyper / hypo glycemic episodes.  Heme/Lymph: No excessive bleeding, bruising, enlarged lymph nodes.  Physical Exam  BP 116/78   Pulse 68   Temp 97.8 F (36.6 C)   Resp 16   Ht 5' 8.5" (1.74 m)   Wt 241 lb 12.8 oz (109.7 kg)   BMI 36.23 kg/m   General Appearance: Well nourished, well groomed and in no apparent distress.  Eyes: PERRLA, EOMs, conjunctiva no swelling or erythema, normal fundi and vessels. Sinuses: No frontal/maxillary tenderness ENT/Mouth: EACs patent / TMs  nl. Nares clear without erythema, swelling, mucoid exudates. Oral hygiene is good. No erythema, swelling, or exudate. Tongue normal, non-obstructing. Tonsils not swollen or erythematous. Hearing normal.  Neck: Supple, thyroid normal. No bruits, nodes or JVD. Respiratory: Respiratory effort normal.  BS equal and clear bilateral without rales, rhonci, wheezing or stridor. Cardio: Heart sounds are normal with regular rate and rhythm and no murmurs, rubs or gallops. Peripheral pulses are normal and equal bilaterally without edema. No aortic or femoral bruits. Chest: symmetric with normal excursions and percussion. Breasts: Symmetric, without lumps, nipple discharge, retractions, or fibrocystic changes.  Abdomen: Flat, soft with bowel sounds active.  Nontender, no guarding, rebound, hernias, masses, or organomegaly.  Lymphatics: Non tender without lymphadenopathy.  Genitourinary:  Musculoskeletal: Full ROM all peripheral extremities, joint stability, 5/5 strength, and normal gait. Skin: Warm and dry without rashes, lesions, cyanosis, clubbing or  ecchymosis.  Neuro: Cranial nerves intact, reflexes equal bilaterally. Normal muscle tone, no cerebellar symptoms. Sensation intact.  Pysch: Alert and oriented X 3, normal affect, Insight and Judgment appropriate.   Assessment and Plan  1. Annual Preventative Screening Examination  2. Essential hypertension  - EKG 12-Lead - [place], RETROPERITNL ABD,  LTD - Urinalysis, Routine w reflex microscopic - Microalbumin / creatinine urine ratio - CBC with Differential/Platelet - BASIC METABOLIC PANEL WITH GFR - Magnesium - TSH  3. Hyperlipidemia, mixed  - EKG 12-Lead - [place], RETROPERITNL ABD,  LTD - Hepatic function panel - Lipid panel  4. Prediabetes  - EKG 12-Lead - [place], RETROPERITNL ABD,  LTD - Hemoglobin A1c - Insulin, random  5. Vitamin D deficiency  - VITAMIN D 25 Hydroxy   6. Screening for rectal cancer  - POC Hemoccult Bld/Stl   7. Class 2 severe obesity due to excess calories with serious comorbidity and body mass index (BMI) of 37.0 to 37.9 in adult ([place])   8. Prostate cancer screening  -  PSA  9. Screening for ischemic heart disease  - EKG 12-Lead - Lipid panel  10. Screening for AAA (aortic abdominal aneurysm)  - [place], RETROPERITNL ABD,  LTD  11. Screening examination for pulmonary tuberculosis  - PPD  12. Fatigue, unspecified type  - Vitamin B12 - Iron and TIBC - Testosterone - CBC with Differential/Platelet  13. Medication management  - Urinalysis, Routine w reflex microscopic - Microalbumin / creatinine urine ratio - CBC with Differential/Platelet - BASIC METABOLIC PANEL WITH GFR - Hepatic function panel - Magnesium - Lipid panel - TSH -  Hemoglobin A1c - Insulin, random - VITAMIN D 25 Hydroxy        Patient was counseled in prudent diet to achieve/maintain BMI less than 25 for weight control, BP monitoring, regular exercise and medications. Discussed med's effects and SE's. Screening labs and tests as requested with regular follow-up as recommended. Over 40 minutes of exam, counseling, chart review and high complex critical decision making was performed.

## 2016-10-05 LAB — URINALYSIS, ROUTINE W REFLEX MICROSCOPIC
Bilirubin Urine: NEGATIVE
Glucose, UA: NEGATIVE
Hgb urine dipstick: NEGATIVE
Ketones, ur: NEGATIVE
LEUKOCYTES UA: NEGATIVE
NITRITE: NEGATIVE
PROTEIN: NEGATIVE
SPECIFIC GRAVITY, URINE: 1.024 (ref 1.001–1.035)
pH: 5.5 (ref 5.0–8.0)

## 2016-10-05 LAB — VITAMIN D 25 HYDROXY (VIT D DEFICIENCY, FRACTURES): Vit D, 25-Hydroxy: 136 ng/mL — ABNORMAL HIGH (ref 30–100)

## 2016-10-05 LAB — MICROALBUMIN / CREATININE URINE RATIO
CREATININE, URINE: 264 mg/dL (ref 20–370)
MICROALB UR: 0.5 mg/dL
MICROALB/CREAT RATIO: 2 ug/mg{creat} (ref <30)

## 2016-10-05 LAB — INSULIN, RANDOM: INSULIN: 63.8 u[IU]/mL — AB (ref 2.0–19.6)

## 2016-10-05 LAB — TESTOSTERONE: Testosterone: 187 ng/dL — ABNORMAL LOW (ref 250–827)

## 2016-10-05 LAB — PSA: PSA: 1.6 ng/mL (ref <=4.0)

## 2016-10-05 LAB — HEMOGLOBIN A1C
Hgb A1c MFr Bld: 5.8 % — ABNORMAL HIGH (ref <5.7)
Mean Plasma Glucose: 120 mg/dL

## 2016-10-05 LAB — VITAMIN B12: VITAMIN B 12: 443 pg/mL (ref 200–1100)

## 2016-10-05 LAB — MAGNESIUM: Magnesium: 2.1 mg/dL (ref 1.5–2.5)

## 2016-10-08 LAB — TB SKIN TEST
Induration: 0 mm
TB Skin Test: NEGATIVE

## 2016-10-11 ENCOUNTER — Telehealth: Payer: Self-pay | Admitting: *Deleted

## 2016-10-11 NOTE — Telephone Encounter (Signed)
Patient called and states he drinks 4 bottles, 32 ounces each, of G2 gatorade daily.  He says they contain 34 grams of sugar per bottle.  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  The patient is aware.

## 2016-11-30 ENCOUNTER — Other Ambulatory Visit: Payer: Self-pay | Admitting: Physician Assistant

## 2016-11-30 DIAGNOSIS — R350 Frequency of micturition: Secondary | ICD-10-CM

## 2016-12-06 ENCOUNTER — Other Ambulatory Visit: Payer: Self-pay

## 2016-12-06 DIAGNOSIS — Z1212 Encounter for screening for malignant neoplasm of rectum: Secondary | ICD-10-CM

## 2016-12-06 LAB — POC HEMOCCULT BLD/STL (HOME/3-CARD/SCREEN)
Card #2 Fecal Occult Blod, POC: NEGATIVE
Card #3 Fecal Occult Blood, POC: NEGATIVE
Fecal Occult Blood, POC: NEGATIVE

## 2017-01-22 ENCOUNTER — Ambulatory Visit: Payer: Self-pay | Admitting: Physician Assistant

## 2017-02-26 ENCOUNTER — Other Ambulatory Visit: Payer: Self-pay | Admitting: Internal Medicine

## 2017-04-24 ENCOUNTER — Ambulatory Visit: Payer: Self-pay | Admitting: Internal Medicine

## 2017-05-08 ENCOUNTER — Other Ambulatory Visit: Payer: Self-pay | Admitting: Internal Medicine

## 2017-05-16 ENCOUNTER — Encounter: Payer: Self-pay | Admitting: Internal Medicine

## 2017-05-16 ENCOUNTER — Ambulatory Visit (INDEPENDENT_AMBULATORY_CARE_PROVIDER_SITE_OTHER): Payer: 59 | Admitting: Physician Assistant

## 2017-05-16 VITALS — BP 124/86 | HR 77 | Temp 97.7°F | Resp 18 | Ht 68.5 in | Wt 247.4 lb

## 2017-05-16 DIAGNOSIS — R7303 Prediabetes: Secondary | ICD-10-CM

## 2017-05-16 DIAGNOSIS — E782 Mixed hyperlipidemia: Secondary | ICD-10-CM | POA: Diagnosis not present

## 2017-05-16 DIAGNOSIS — I10 Essential (primary) hypertension: Secondary | ICD-10-CM | POA: Diagnosis not present

## 2017-05-16 DIAGNOSIS — Z79899 Other long term (current) drug therapy: Secondary | ICD-10-CM

## 2017-05-16 DIAGNOSIS — E559 Vitamin D deficiency, unspecified: Secondary | ICD-10-CM

## 2017-05-16 NOTE — Progress Notes (Signed)
This very nice 62 y.o.male presents for 3 month follow up with Hypertension, Hyperlipidemia, Pre-Diabetes and Vitamin D Deficiency.   He states he has neck pain, intermittent, goes across his shoulders, with occ numbness. Last for 5-6 mins, nothing worse, nothing better.   He also complains of dry mouth x 1 month. He is on ditropan.   Patient is treated for HTN & BP has been controlled at home. Today's BP: 124/86. Patient has had no complaints of any cardiac type chest pain, palpitations, dyspnea / orthopnea / PND, dizziness, claudication, or dependent edema.  Hyperlipidemia is controlled with diet & meds. Patient denies myalgias or other med SE's. Last Lipids were  Lab Results  Component Value Date   CHOL 184 10/04/2016   HDL 38 (L) 10/04/2016   LDLCALC 104 (H) 10/04/2016   TRIG 211 (H) 10/04/2016   CHOLHDL 4.8 10/04/2016   Also, the patient has history of T2_NIDDM PreDiabetes and has had no symptoms of reactive hypoglycemia, diabetic polys, paresthesias or visual blurring.  Last A1c was  Lab Results  Component Value Date   HGBA1C 5.8 (H) 10/04/2016   Lab Results  Component Value Date   GFRAA 62 10/04/2016   Further, the patient also has history of Vitamin D Deficiency and supplements vitamin D without any suspected side-effects. Last vitamin D was   Lab Results  Component Value Date   VD25OH 136 (H) 10/04/2016   BMI is Body mass index is 37.07 kg/m., he is working on diet and exercise. Wt Readings from Last 3 Encounters:  05/16/17 247 lb 6.4 oz (112.2 kg)  10/04/16 241 lb 12.8 oz (109.7 kg)  06/20/16 250 lb 12.8 oz (113.8 kg)    Current Outpatient Medications on File Prior to Visit  Medication Sig  . doxazosin (CARDURA) 8 MG tablet TAKE 1 TABLET BY MOUTH  DAILY  . oxybutynin (DITROPAN) 5 MG tablet TAKE 1 TABLET 3 TIMES A DAY FOR BLADDER CONTROL  . Vitamin D, Ergocalciferol, (DRISDOL) 50000 units CAPS capsule TAKE 1 CAPSULE DAILY FOR  SEVERE VITAMIN D DEFICIENCY    No current facility-administered medications on file prior to visit.    No Known Allergies PMHx:   Past Medical History:  Diagnosis Date  . Hyperlipidemia   . Hypertension   . Hypogonadism male   . Other abnormal glucose   . Sleep apnea    mild, no cpap  . Unspecified vitamin D deficiency    Immunization History  Administered Date(s) Administered  . PPD Test 05/06/2013, 05/07/2014, 08/18/2015, 10/04/2016  . Td 04/02/2005  . Tdap 08/18/2015   Past Surgical History:  Procedure Laterality Date  . COLONOSCOPY    . POLYPECTOMY     FHx:    Reviewed / unchanged  SHx:    Reviewed / unchanged  Systems Review:  Constitutional: Denies fever, chills, wt changes, headaches, insomnia, fatigue, night sweats, change in appetite. Eyes: Denies redness, blurred vision, diplopia, discharge, itchy, watery eyes.  ENT: Denies discharge, congestion, post nasal drip, epistaxis, sore throat, earache, hearing loss, dental pain, tinnitus, vertigo, sinus pain, snoring.  CV: Denies chest pain, palpitations, irregular heartbeat, syncope, dyspnea, diaphoresis, orthopnea, PND, claudication or edema. Respiratory: denies cough, dyspnea, DOE, pleurisy, hoarseness, laryngitis, wheezing.  Gastrointestinal: Denies dysphagia, odynophagia, heartburn, reflux, water brash, abdominal pain or cramps, nausea, vomiting, bloating, diarrhea, constipation, hematemesis, melena, hematochezia  or hemorrhoids. Genitourinary: Denies dysuria, frequency, urgency, nocturia, hesitancy, discharge, hematuria or flank pain. Musculoskeletal: Denies arthralgias, myalgias, stiffness, jt. swelling, pain, limping  or strain/sprain.  Skin: Denies pruritus, rash, hives, warts, acne, eczema or change in skin lesion(s). Neuro: No weakness, tremor, incoordination, spasms, paresthesia or pain. Psychiatric: Denies confusion, memory loss or sensory loss. Endo: Denies change in weight, skin or hair change.  Heme/Lymph: No excessive bleeding,  bruising or enlarged lymph nodes.  Physical Exam  BP 124/86   Pulse 77   Temp 97.7 F (36.5 C)   Resp 18   Ht 5' 8.5" (1.74 m)   Wt 247 lb 6.4 oz (112.2 kg)   SpO2 97%   BMI 37.07 kg/m   Appears well nourished, well groomed  and in no distress.  Eyes: PERRLA, EOMs, conjunctiva no swelling or erythema. Sinuses: No frontal/maxillary tenderness ENT/Mouth: EAC's clear, TM's nl w/o erythema, bulging. Nares clear w/o erythema, swelling, exudates. Oropharynx clear without erythema or exudates. Oral hygiene is good. Tongue normal, non obstructing. Hearing intact.  Neck: Supple. Thyroid not palpable. Car 2+/2+ without bruits, nodes or JVD. Chest: Respirations nl with BS clear & equal w/o rales, rhonchi, wheezing or stridor.  Cor: Heart sounds normal w/ regular rate and rhythm without sig. murmurs, gallops, clicks or rubs. Peripheral pulses normal and equal  without edema.  Abdomen: Soft & bowel sounds normal. Non-tender w/o guarding, rebound, hernias, masses or organomegaly.  Lymphatics: Unremarkable.  Musculoskeletal: Full ROM all peripheral extremities, joint stability, 5/5 strength and normal gait.  Skin: Warm, dry without exposed rashes, lesions or ecchymosis apparent.  Neuro: Cranial nerves intact, reflexes equal bilaterally. Sensory-motor testing grossly intact. Tendon reflexes grossly intact.  Pysch: Alert & oriented x 3.  Insight and judgement nl & appropriate. No ideations.  Assessment and Plan:  Essential hypertension - continue medications, DASH diet, exercise and monitor at home. Call if greater than 130/80.  -     CBC with Differential/Platelet -     BASIC METABOLIC PANEL WITH GFR -     Magnesium -     TSH  Hyperlipidemia, mixed -continue medications, check lipids, decrease fatty foods, increase activity.  -     Hepatic function panel -     Lipid panel -     TSH  Prediabetes Discussed disease progression and risks Discussed diet/exercise, weight management and risk  modification -     Hemoglobin A1c -     Insulin, random  Vitamin D deficiency -     VITAMIN D 25 Hydroxy (Vit-D Deficiency, Fractures)  Medication management -     CBC with Differential/Platelet -     BASIC METABOLIC PANEL WITH GFR -     Hepatic function panel -     Magnesium -     Lipid panel -     TSH -     Hemoglobin A1c -     Insulin, random -     VITAMIN D 25 Hydroxy (Vit-D Deficiency, Fractures)  Morbid obesity - follow up 3 months for progress monitoring - increase veggies, decrease carbs - long discussion about weight loss, diet, and exercise   Future Appointments  Date Time Provider [place]  11/04/2017  3:00 PM Unk Pinto, MD [id number] None

## 2017-05-16 NOTE — Patient Instructions (Addendum)
Being dehydrated can hurt your kidneys, cause fatigue, headaches, muscle aches, joint pain, and dry skin/nails so please increase your fluids.   Drink 80-100 oz a day of water, measure it out! Eat 3 meals a day, have to do breakfast, eat protein- hard boiled eggs, protein bar like nature [employer] protein bar, greek yogurt like oikos triple zero, chobani 100, or light n fit greek  Can check out plantnanny app on your phone to help you keep track of your water   Diet soda leads to weight gain.  We recently discovered that the artifical sugar in the soda stops an enzyme in your stomach that is suppose to signal that your brain is full. So patients that drink a lot of diet soda will never feel full and tend to over eat. So please cut back on diet soda and it can help with your weight loss.   Dining out: help on how to choose  It is better if you can meal plan and eat at your house but sometimes life happens so here are some tips to help you make healthier choices while eating out:  1) Ask for your server to pack up 1/2 of your meal to take home for lunch or later. Portion sizes are huge so if you don't have all of it in front of you, you are less likely to eat it all.    2) Ask if they have a smaller portion or lunch portion available, this is often cheaper as well!  3) Ask to not have the bread basket brought to the table  4) Ask for the dressing on the side.   5) Look at the nutrition menu BEFORE you go to a restaurant or fast food chain and have what you will eat in mind. You can also look it up on food tracking ups like Myfitness Pal or Lost it. However the web site for the restaurant is usually the best bet.   6) Don't forget that you are the costumer, it is okay to ask them to leave things out, ask about substituting, or ask them to cook with less oil!  Here is some more general information for you!     Veggies are great because you can eat a ton! They are low in calories, great to  fill you up, and have a ton of vitamins, minerals, and protein.         Go to the ER if you have any new weakness in your arms, trouble with your grip, worse headache ever, fever, chills. or have worsening pain.     Cervical Sprain A cervical sprain is a stretch or tear in one or more of the tough, cord-like tissues that connect bones (ligaments) in the neck. Cervical sprains can range from mild to severe. Severe cervical sprains can cause the spinal bones (vertebrae) in the neck to be unstable. This can lead to spinal cord damage and can result in serious nervous system problems. The amount of time that it takes for a cervical sprain to get better depends on the cause and extent of the injury. Most cervical sprains heal in 4-6 weeks. What are the causes? Cervical sprains may be caused by an injury (trauma), such as from a motor vehicle accident, a fall, or sudden forward and backward whipping movement of the head and neck (whiplash injury). Mild cervical sprains may be caused by wear and tear over time, such as from poor posture, sitting in a chair that does not provide  support, or looking up or down for long periods of time. What increases the risk? The following factors may make you more likely to develop this condition:  Participating in activities that have a high risk of trauma to the neck. These include contact sports, auto racing, gymnastics, and diving.  Taking risks when driving or riding in a motor vehicle, such as speeding.  Having osteoarthritis of the spine.  Having poor strength and flexibility of the neck.  A previous neck injury.  Having poor posture.  Spending a lot of time in certain positions that put stress on the neck, such as sitting at a computer for long periods of time.  What are the signs or symptoms? Symptoms of this condition include:  Pain, soreness, stiffness, tenderness, swelling, or a burning sensation in the front, back, or sides of the  neck.  Sudden tightening of neck muscles that you cannot control (muscle spasms).  Pain in the shoulders or upper back.  Limited ability to move the neck.  Headache.  Dizziness.  Nausea.  Vomiting.  Weakness, numbness, or tingling in a hand or an arm.  Symptoms may develop right away after injury, or they may develop over a few days. In some cases, symptoms may go away with treatment and return (recur) over time. How is this diagnosed? This condition may be diagnosed based on:  Your medical history.  Your symptoms.  Any recent injuries or known neck problems that you have, such as arthritis in the neck.  A physical exam.  Imaging tests, such as: ? X-rays. ? MRI. ? CT scan.  How is this treated? This condition is treated by resting and icing the injured area and doing physical therapy exercises. Depending on the severity of your condition, treatment may also include:  Keeping your neck in place (immobilized) for periods of time. This may be done using: ? A cervical collar. This supports your chin and the back of your head. ? A cervical traction device. This is a sling that holds up your head. This removes weight and pressure from your neck, and it may help to relieve pain.  Medicines that help to relieve pain and inflammation.  Medicines that help to relax your muscles (muscle relaxants).  Surgery. This is rare.  Follow these instructions at home: If you have a cervical collar:  Wear it as told by your health care provider. Do not remove the collar unless instructed by your health care provider.  Ask your health care provider before you make any adjustments to your collar.  If you have long hair, keep it outside of the collar.  Ask your health care provider if you can remove the collar for cleaning and bathing. If you are allowed to remove the collar for cleaning or bathing: ? Follow instructions from your health care provider about how to remove the collar  safely. ? Clean the collar by wiping it with mild soap and water and drying it completely. ? If your collar has removable pads, remove them every 1-2 days and wash them by hand with soap and water. Let them air-dry completely before you put them back in the collar. ? Check your skin under the collar for irritation or sores. If you see any, tell your health care provider. Managing pain, stiffness, and swelling  If directed, use a cervical traction device as told by your health care provider.  If directed, apply heat to the affected area before you do your physical therapy or as often as  told by your health care provider. Use the heat source that your health care provider recommends, such as a moist heat pack or a heating pad. ? Place a towel between your skin and the heat source. ? Leave the heat on for 20-30 minutes. ? Remove the heat if your skin turns bright red. This is especially important if you are unable to feel pain, heat, or cold. You may have a greater risk of getting burned.  If directed, put ice on the affected area: ? Put ice in a plastic bag. ? Place a towel between your skin and the bag. ? Leave the ice on for 20 minutes, 2-3 times a day. Activity  Do not drive while wearing a cervical collar. If you do not have a cervical collar, ask your health care provider if it is safe to drive while your neck heals.  Do not drive or use heavy machinery while taking prescription pain medicine or muscle relaxants, unless your health care provider approves.  Do not lift anything that is heavier than 10 lb (4.5 kg) until your health care provider tells you that it is safe.  Rest as directed by your health care provider. Avoid positions and activities that make your symptoms worse. Ask your health care provider what activities are safe for you.  If physical therapy was prescribed, do exercises as told by your health care provider or physical therapist. General instructions  Take  over-the-counter and prescription medicines only as told by your health care provider.  Do not use any products that contain nicotine or tobacco, such as cigarettes and e-cigarettes. These can delay healing. If you need help quitting, ask your health care provider.  Keep all follow-up visits as told by your health care provider or physical therapist. This is important. How is this prevented? To prevent a cervical sprain from happening again:  Use and maintain good posture. Make any needed adjustments to your workstation to help you use good posture.  Exercise regularly as directed by your health care provider or physical therapist.  Avoid risky activities that may cause a cervical sprain.  Contact a health care provider if:  You have symptoms that get worse or do not get better after 2 weeks of treatment.  You have pain that gets worse or does not get better with medicine.  You develop new, unexplained symptoms.  You have sores or irritated skin on your neck from wearing your cervical collar. Get help right away if:  You have severe pain.  You develop numbness, tingling, or weakness in any part of your body.  You cannot move a part of your body (you have paralysis).  You have neck pain along with: ? Severe dizziness. ? Headache. Summary  A cervical sprain is a stretch or tear in one or more of the tough, cord-like tissues that connect bones (ligaments) in the neck.  Cervical sprains may be caused by an injury (trauma), such as from a motor vehicle accident, a fall, or sudden forward and backward whipping movement of the head and neck (whiplash injury).  Symptoms may develop right away after injury, or they may develop over a few days.  This condition is treated by resting and icing the injured area and doing physical therapy exercises. This information is not intended to replace advice given to you by your health care provider. Make sure you discuss any questions you have  with your health care provider. Document Released: 01/14/2007 Document Revised: 11/16/2015 Document Reviewed: 11/16/2015 Elsevier Interactive  Patient Education  2017 [employer].   Cervical Strain and Sprain Rehab Ask your health care provider which exercises are safe for you. Do exercises exactly as told by your health care provider and adjust them as directed. It is normal to feel mild stretching, pulling, tightness, or discomfort as you do these exercises, but you should stop right away if you feel sudden pain or your pain gets worse.Do not begin these exercises until told by your health care provider. Stretching and range of motion exercises These exercises warm up your muscles and joints and improve the movement and flexibility of your neck. These exercises also help to relieve pain, numbness, and tingling. Exercise A: Cervical side bend  1. Using good posture, sit on a stable chair or stand up. 2. Without moving your shoulders, slowly tilt your left / right ear to your shoulder until you feel a stretch in your neck muscles. You should be looking straight ahead. 3. Hold for __________ seconds. 4. Repeat with the other side of your neck. Repeat __________ times. Complete this exercise __________ times a day. Exercise B: Cervical rotation  1. Using good posture, sit on a stable chair or stand up. 2. Slowly turn your head to the side as if you are looking over your left / right shoulder. ? Keep your eyes level with the ground. ? Stop when you feel a stretch along the side and the back of your neck. 3. Hold for __________ seconds. 4. Repeat this by turning to your other side. Repeat __________ times. Complete this exercise __________ times a day. Exercise C: Thoracic extension and pectoral stretch 1. Roll a towel or a small blanket so it is about 4 inches (10 cm) in diameter. 2. Lie down on your back on a firm surface. 3. Put the towel lengthwise, under your spine in the middle of  your back. It should not be not under your shoulder blades. The towel should line up with your spine from your middle back to your lower back. 4. Put your hands behind your head and let your elbows fall out to your sides. 5. Hold for __________ seconds. Repeat __________ times. Complete this exercise __________ times a day. Strengthening exercises These exercises build strength and endurance in your neck. Endurance is the ability to use your muscles for a long time, even after your muscles get tired. Exercise D: Upper cervical flexion, isometric 1. Lie on your back with a thin pillow behind your head and a small rolled-up towel under your neck. 2. Gently tuck your chin toward your chest and nod your head down to look toward your feet. Do not lift your head off the pillow. 3. Hold for __________ seconds. 4. Release the tension slowly. Relax your neck muscles completely before you repeat this exercise. Repeat __________ times. Complete this exercise __________ times a day. Exercise E: Cervical extension, isometric  1. Stand about 6 inches (15 cm) away from a wall, with your back facing the wall. 2. Place a soft object, about 6-8 inches (15-20 cm) in diameter, between the back of your head and the wall. A soft object could be a small pillow, a ball, or a folded towel. 3. Gently tilt your head back and press into the soft object. Keep your jaw and forehead relaxed. 4. Hold for __________ seconds. 5. Release the tension slowly. Relax your neck muscles completely before you repeat this exercise. Repeat __________ times. Complete this exercise __________ times a day. Posture and body mechanics  Body  mechanics refers to the movements and positions of your body while you do your daily activities. Posture is part of body mechanics. Good posture and healthy body mechanics can help to relieve stress in your body's tissues and joints. Good posture means that your spine is in its natural S-curve position  (your spine is neutral), your shoulders are pulled back slightly, and your head is not tipped forward. The following are general guidelines for applying improved posture and body mechanics to your everyday activities. Standing  When standing, keep your spine neutral and keep your feet about hip-width apart. Keep a slight bend in your knees. Your ears, shoulders, and hips should line up.  When you do a task in which you stand in one place for a long time, place one foot up on a stable object that is 2-4 inches (5-10 cm) high, such as a footstool. This helps keep your spine neutral. Sitting   When sitting, keep your spine neutral and your keep feet flat on the floor. Use a footrest, if necessary, and keep your thighs parallel to the floor. Avoid rounding your shoulders, and avoid tilting your head forward.  When working at a desk or a computer, keep your desk at a height where your hands are slightly lower than your elbows. Slide your chair under your desk so you are close enough to maintain good posture.  When working at a computer, place your monitor at a height where you are looking straight ahead and you do not have to tilt your head forward or downward to look at the screen. Resting When lying down and resting, avoid positions that are most painful for you. Try to support your neck in a neutral position. You can use a contour pillow or a small rolled-up towel. Your pillow should support your neck but not push on it. This information is not intended to replace advice given to you by your health care provider. Make sure you discuss any questions you have with your health care provider. Document Released: 03/19/2005 Document Revised: 11/24/2015 Document Reviewed: 02/23/2015 Elsevier Interactive Patient Education  [employer].

## 2017-05-17 ENCOUNTER — Other Ambulatory Visit: Payer: Self-pay | Admitting: Internal Medicine

## 2017-05-17 DIAGNOSIS — E782 Mixed hyperlipidemia: Secondary | ICD-10-CM

## 2017-05-17 LAB — BASIC METABOLIC PANEL WITH GFR
BUN/Creatinine Ratio: 17 (calc) (ref 6–22)
BUN: 24 mg/dL (ref 7–25)
CALCIUM: 9.5 mg/dL (ref 8.6–10.3)
CHLORIDE: 107 mmol/L (ref 98–110)
CO2: 29 mmol/L (ref 20–32)
CREATININE: 1.43 mg/dL — AB (ref 0.70–1.25)
GFR, Est African American: 61 mL/min/{1.73_m2} (ref >=60)
GFR, Est Non African American: 52 mL/min/{1.73_m2} — ABNORMAL LOW (ref >=60)
Glucose, Bld: 84 mg/dL (ref 65–99)
Potassium: 4 mmol/L (ref 3.5–5.3)
Sodium: 142 mmol/L (ref 135–146)

## 2017-05-17 LAB — HEMOGLOBIN A1C
HEMOGLOBIN A1C: 6.1 %{Hb} — AB (ref <5.7)
Mean Plasma Glucose: 128 (calc)
eAG (mmol/L): 7.1 (calc)

## 2017-05-17 LAB — LIPID PANEL
CHOLESTEROL: 196 mg/dL (ref <200)
HDL: 43 mg/dL (ref >40)
LDL CHOLESTEROL (CALC): 133 mg/dL — AB
Non-HDL Cholesterol (Calc): 153 mg/dL (calc) — ABNORMAL HIGH (ref <130)
Total CHOL/HDL Ratio: 4.6 (calc) (ref <5.0)
Triglycerides: 96 mg/dL (ref <150)

## 2017-05-17 LAB — VITAMIN D 25 HYDROXY (VIT D DEFICIENCY, FRACTURES): VIT D 25 HYDROXY: 82 ng/mL (ref 30–100)

## 2017-05-17 LAB — CBC WITH DIFFERENTIAL/PLATELET
BASOS ABS: 28 {cells}/uL (ref 0–200)
Basophils Relative: 0.5 %
EOS ABS: 101 {cells}/uL (ref 15–500)
EOS PCT: 1.8 %
HCT: 38.2 % — ABNORMAL LOW (ref 38.5–50.0)
HEMOGLOBIN: 12.9 g/dL — AB (ref 13.2–17.1)
Lymphs Abs: 1445 cells/uL (ref 850–3900)
MCH: 28.9 pg (ref 27.0–33.0)
MCHC: 33.8 g/dL (ref 32.0–36.0)
MCV: 85.7 fL (ref 80.0–100.0)
MONOS PCT: 8.8 %
MPV: 10.1 fL (ref 7.5–12.5)
NEUTROS ABS: 3534 {cells}/uL (ref 1500–7800)
Neutrophils Relative %: 63.1 %
PLATELETS: 188 10*3/uL (ref 140–400)
RBC: 4.46 10*6/uL (ref 4.20–5.80)
RDW: 13.3 % (ref 11.0–15.0)
TOTAL LYMPHOCYTE: 25.8 %
WBC mixed population: 493 cells/uL (ref 200–950)
WBC: 5.6 10*3/uL (ref 3.8–10.8)

## 2017-05-17 LAB — HEPATIC FUNCTION PANEL
AG RATIO: 1.4 (calc) (ref 1.0–2.5)
ALBUMIN MSPROF: 4.1 g/dL (ref 3.6–5.1)
ALT: 13 U/L (ref 9–46)
AST: 18 U/L (ref 10–35)
Alkaline phosphatase (APISO): 47 U/L (ref 40–115)
Bilirubin, Direct: 0.2 mg/dL (ref 0.0–0.2)
GLOBULIN: 3 g/dL (ref 1.9–3.7)
Indirect Bilirubin: 0.6 mg/dL (calc) (ref 0.2–1.2)
TOTAL PROTEIN: 7.1 g/dL (ref 6.1–8.1)
Total Bilirubin: 0.8 mg/dL (ref 0.2–1.2)

## 2017-05-17 LAB — MAGNESIUM: MAGNESIUM: 1.9 mg/dL (ref 1.5–2.5)

## 2017-05-17 LAB — INSULIN, RANDOM: INSULIN: 13 u[IU]/mL (ref 2.0–19.6)

## 2017-05-17 LAB — TSH: TSH: 2.31 mIU/L (ref 0.40–4.50)

## 2017-05-17 MED ORDER — ROSUVASTATIN CALCIUM 40 MG PO TABS: ORAL_TABLET | ORAL | 5 refills | Status: DC

## 2017-05-22 ENCOUNTER — Other Ambulatory Visit: Payer: Self-pay | Admitting: *Deleted

## 2017-05-22 DIAGNOSIS — E782 Mixed hyperlipidemia: Secondary | ICD-10-CM

## 2017-05-22 MED ORDER — ROSUVASTATIN CALCIUM 40 MG PO TABS: ORAL_TABLET | ORAL | 1 refills | Status: DC

## 2017-07-01 ENCOUNTER — Other Ambulatory Visit: Payer: Self-pay | Admitting: Internal Medicine

## 2017-07-01 DIAGNOSIS — R350 Frequency of micturition: Secondary | ICD-10-CM

## 2017-08-14 ENCOUNTER — Ambulatory Visit: Payer: Self-pay | Admitting: Adult Health

## 2017-11-03 ENCOUNTER — Other Ambulatory Visit: Payer: Self-pay | Admitting: Internal Medicine

## 2017-11-03 MED ORDER — SILDENAFIL CITRATE 100 MG PO TABS
ORAL_TABLET | ORAL | 11 refills | Status: DC
Start: 2017-11-03 — End: 2017-11-19

## 2017-11-04 ENCOUNTER — Encounter: Payer: Self-pay | Admitting: Internal Medicine

## 2017-11-07 ENCOUNTER — Other Ambulatory Visit: Payer: Self-pay | Admitting: Internal Medicine

## 2017-11-13 ENCOUNTER — Other Ambulatory Visit: Payer: Self-pay | Admitting: Internal Medicine

## 2017-11-19 ENCOUNTER — Encounter: Payer: Self-pay | Admitting: Internal Medicine

## 2017-11-19 ENCOUNTER — Ambulatory Visit: Payer: 59 | Admitting: Internal Medicine

## 2017-11-19 VITALS — BP 116/80 | HR 72 | Temp 97.3°F | Resp 18 | Ht 68.5 in | Wt 242.2 lb

## 2017-11-19 DIAGNOSIS — R7303 Prediabetes: Secondary | ICD-10-CM | POA: Diagnosis not present

## 2017-11-19 DIAGNOSIS — I10 Essential (primary) hypertension: Secondary | ICD-10-CM | POA: Diagnosis not present

## 2017-11-19 DIAGNOSIS — Z79899 Other long term (current) drug therapy: Secondary | ICD-10-CM

## 2017-11-19 DIAGNOSIS — Z111 Encounter for screening for respiratory tuberculosis: Secondary | ICD-10-CM

## 2017-11-19 DIAGNOSIS — Z136 Encounter for screening for cardiovascular disorders: Secondary | ICD-10-CM

## 2017-11-19 DIAGNOSIS — E559 Vitamin D deficiency, unspecified: Secondary | ICD-10-CM

## 2017-11-19 DIAGNOSIS — Z125 Encounter for screening for malignant neoplasm of prostate: Secondary | ICD-10-CM | POA: Diagnosis not present

## 2017-11-19 DIAGNOSIS — Z0000 Encounter for general adult medical examination without abnormal findings: Secondary | ICD-10-CM | POA: Diagnosis not present

## 2017-11-19 DIAGNOSIS — Z1211 Encounter for screening for malignant neoplasm of colon: Secondary | ICD-10-CM

## 2017-11-19 DIAGNOSIS — Z1212 Encounter for screening for malignant neoplasm of rectum: Secondary | ICD-10-CM

## 2017-11-19 DIAGNOSIS — E782 Mixed hyperlipidemia: Secondary | ICD-10-CM | POA: Diagnosis not present

## 2017-11-19 DIAGNOSIS — Z8249 Family history of ischemic heart disease and other diseases of the circulatory system: Secondary | ICD-10-CM | POA: Insufficient documentation

## 2017-11-19 DIAGNOSIS — Z0001 Encounter for general adult medical examination with abnormal findings: Secondary | ICD-10-CM

## 2017-11-19 DIAGNOSIS — R5383 Other fatigue: Secondary | ICD-10-CM | POA: Insufficient documentation

## 2017-11-19 NOTE — Progress Notes (Addendum)
[facility]   Unk Pinto, M.D.     Uvaldo Bristle. Silverio Lay, P.A.-C Liane Comber, [place]                [address] [place] [address] Telephone [phone] Telefax [phone] Annual  Screening/Preventative Visit  & Comprehensive Evaluation & Examination     This very nice 62 y.o. MBM presents for a Screening /Preventative Visit & comprehensive evaluation and management of multiple medical co-morbidities.  Patient has been followed for HTN, HLD, T2_NIDDM  Prediabetes and Vitamin D Deficiency.     HTN predates circa 2005. Patient's BP has been controlled at home.  Today's BP is at goal - 116/80. Patient denies any cardiac symptoms as chest pain, palpitations, shortness of breath, dizziness or ankle swelling.     Patient's hyperlipidemia is controlled with diet and medications. Patient denies myalgias or other medication SE's. Last lipids were at goal: Lab Results  Component Value Date   CHOL 118 11/19/2017   HDL 41 11/19/2017   LDLCALC 56 11/19/2017   TRIG 127 11/19/2017   CHOLHDL 2.9 11/19/2017      Patient has hx/o Morbid Obesity (BMI 36+) and prediabetes (A1c 6.4%/2011)  and patient denies reactive hypoglycemic symptoms, visual blurring, diabetic polys or paresthesias. Last A1c was not at goal: Lab Results  Component Value Date   HGBA1C 6.2 (H) 11/19/2017       Finally, patient has history of Vitamin D Deficiency ("19"/2008)  and last vitamin D was sl elevated:  Lab Results  Component Value Date   VD25OH 108 (H) 11/19/2017   Current Outpatient Medications on File Prior to Visit  Medication Sig  . doxazosin (CARDURA) 8 MG tablet TAKE 1 TABLET BY MOUTH  DAILY  . oxybutynin (DITROPAN) 5 MG tablet TAKE 1 TABLET BY MOUTH 3  TIMES A DAY FOR BLADDER  CONTROL  . sildenafil (REVATIO) 20 MG tablet TAKE 2 TO 5 TABLETS 1 HOUR BEFORE NEEDED  . Vitamin D, Ergocalciferol, (DRISDOL)  50000 units CAPS capsule TAKE 1 CAPSULE DAILY FOR  SEVERE VITAMIN D DEFICIENCY  . rosuvastatin (CRESTOR) 40 MG tablet Take 1/2 to 1 tablet daily or as directed for Cholesterol (Patient not taking: Reported on 11/19/2017)   No current facility-administered medications on file prior to visit.    No Known Allergies Past Medical History:  Diagnosis Date  . Hyperlipidemia   . Hypertension   . Hypogonadism male   . Other abnormal glucose   . Sleep apnea    mild, no cpap  . Unspecified vitamin D deficiency    Health Maintenance  Topic Date Due  . INFLUENZA VACCINE  10/31/2017  . COLONOSCOPY  01/12/2019  . TETANUS/TDAP  08/17/2025  . Hepatitis C Screening  Completed  . HIV Screening  Completed   Immunization History  Administered Date(s) Administered  . PPD Test 05/06/2013, 05/07/2014, 08/18/2015, 10/04/2016, 11/19/2017  . Td 04/02/2005  . Tdap 08/18/2015   Last Colon -  01/11/2014 - Dr Ardis Hughs recc 5 yr f/u - due Oct 2020  Past Surgical History:  Procedure Laterality Date  . COLONOSCOPY    . POLYPECTOMY     Family History  Problem Relation Age of Onset  . Heart disease Mother   . Hyperlipidemia Mother   . Hypertension Mother   . Cancer Father        lung, prostate  .  Colon cancer Neg Hx    Social History   Socioeconomic History  . Marital status: Married    Spouse name: Thayer Headings  . Number of children: 1 son & 1 daughter  Occupational History  . 15 years [employer]  Tobacco Use  . Smoking status: Never Smoker  . Smokeless tobacco: Never Used  Substance and Sexual Activity  . Alcohol use: No  . Drug use: No  . Sexual activity: Not on file    ROS Constitutional: Denies fever, chills, weight loss/gain, headaches, insomnia,  night sweats or change in appetite. Does c/o fatigue. Eyes: Denies redness, blurred vision, diplopia, discharge, itchy or watery eyes.  ENT: Denies discharge, congestion, post nasal drip, epistaxis, sore throat,  earache, hearing loss, dental pain, Tinnitus, Vertigo, Sinus pain or snoring.  Cardio: Denies chest pain, palpitations, irregular heartbeat, syncope, dyspnea, diaphoresis, orthopnea, PND, claudication or edema Respiratory: denies cough, dyspnea, DOE, pleurisy, hoarseness, laryngitis or wheezing.  Gastrointestinal: Denies dysphagia, heartburn, reflux, water brash, pain, cramps, nausea, vomiting, bloating, diarrhea, constipation, hematemesis, melena, hematochezia, jaundice or hemorrhoids Genitourinary: Denies dysuria, frequency, urgency, nocturia, hesitancy, discharge, hematuria or flank pain Musculoskeletal: Denies arthralgia, myalgia, stiffness, Jt. Swelling, pain, limp or strain/sprain. Denies Falls. Skin: Denies puritis, rash, hives, warts, acne, eczema or change in skin lesion Neuro: No weakness, tremor, incoordination, spasms, paresthesia or pain Psychiatric: Denies confusion, memory loss or sensory loss. Denies Depression. Endocrine: Denies change in weight, skin, hair change, nocturia, and paresthesia, diabetic polys, visual blurring or hyper / hypo glycemic episodes.  Heme/Lymph: No excessive bleeding, bruising or enlarged lymph nodes.  Physical Exam  BP 116/80   Pulse 72   Temp (!) 97.3 F (36.3 C)   Resp 18   Ht 5' 8.5" (1.74 m)   Wt 242 lb 3.2 oz (109.9 kg)   BMI 36.29 kg/m   General Appearance: Well nourished and well groomed and in no apparent distress.  Eyes: PERRLA, EOMs, conjunctiva no swelling or erythema, normal fundi and vessels. Sinuses: No frontal/maxillary tenderness ENT/Mouth: EACs patent / TMs  nl. Nares clear without erythema, swelling, mucoid exudates. Oral hygiene is good. No erythema, swelling, or exudate. Tongue normal, non-obstructing. Tonsils not swollen or erythematous. Hearing normal.  Neck: Supple, thyroid not palpable. No bruits, nodes or JVD. Respiratory: Respiratory effort normal.  BS equal and clear bilateral without rales, rhonci, wheezing or  stridor. Cardio: Heart sounds are normal with regular rate and rhythm and no murmurs, rubs or gallops. Peripheral pulses are normal and equal bilaterally without edema. No aortic or femoral bruits. Chest: symmetric with normal excursions and percussion.  Abdomen: Soft, with Nl bowel sounds. Nontender, no guarding, rebound, hernias, masses, or organomegaly.  Lymphatics: Non tender without lymphadenopathy.  Genitourinary: No hernias.Testes nl. DRE - prostate nl for age - smooth & firm w/o nodules. Musculoskeletal: Full ROM all peripheral extremities, joint stability, 5/5 strength, and normal gait. Skin: Warm and dry without rashes, lesions, cyanosis, clubbing or  ecchymosis.  Neuro: Cranial nerves intact, reflexes equal bilaterally. Normal muscle tone, no cerebellar symptoms. Sensation intact.  Pysch: Alert and oriented X 3 with normal affect, insight and judgment appropriate.   Assessment and Plan  1. Annual Preventative/Screening Exam    2. Essential hypertension  - EKG 12-Lead - [place], RETROPERITNL ABD,  LTD - Urinalysis, Routine w reflex microscopic - Microalbumin / creatinine urine ratio - CBC with Differential/Platelet - COMPLETE METABOLIC PANEL WITH GFR - Magnesium - TSH  3. Hyperlipidemia, mixed  - EKG 12-Lead -  [place], RETROPERITNL ABD,  LTD - Lipid panel - TSH  4. Prediabetes  - [place], RETROPERITNL ABD,  LTD - Hemoglobin A1c - Insulin, random  5. Vitamin D deficiency  - VITAMIN D 25 Hydroxyl  6. Screening for colorectal cancer  - POC Hemoccult Bld/Stl  7. Prostate cancer screening  - PSA  8. Screening for ischemic heart disease  - EKG 12-Lead  9. FH: heart disease  - EKG 12-Lead - [place], RETROPERITNL ABD,  LTD  10. Screening for AAA (aortic abdominal aneurysm)  - [place], RETROPERITNL ABD,  LTD  11. Screening examination for pulmonary tuberculosis  - PPD  12. Fatigue, unspecified type  - Iron,Total/Total Iron Binding Cap - Vitamin B12 - Testosterone -  CBC with Differential/Platelet - TSH  13. Medication management  - Urinalysis, Routine w reflex microscopic - Microalbumin / creatinine urine ratio - CBC with Differential/Platelet - COMPLETE METABOLIC PANEL WITH GFR - Magnesium - Lipid panel - TSH - Hemoglobin A1c - Insulin, random - VITAMIN D 25 Hydroxy       Patient was counseled in prudent diet, weight control to achieve/maintain BMI less than 25, BP monitoring, regular exercise and medications as discussed.  Discussed med effects and SE's. Routine screening labs and tests as requested with regular follow-up as recommended. Over 40 minutes of exam, counseling, chart review and high complex critical decision making was performed

## 2017-11-19 NOTE — Patient Instructions (Signed)

## 2017-11-20 LAB — CBC WITH DIFFERENTIAL/PLATELET
Basophils Absolute: 18 cells/uL (ref 0–200)
Basophils Relative: 0.3 %
EOS PCT: 2.9 %
Eosinophils Absolute: 171 cells/uL (ref 15–500)
HEMATOCRIT: 37.6 % — AB (ref 38.5–50.0)
Hemoglobin: 12.2 g/dL — ABNORMAL LOW (ref 13.2–17.1)
LYMPHS ABS: 2236 {cells}/uL (ref 850–3900)
MCH: 28.2 pg (ref 27.0–33.0)
MCHC: 32.4 g/dL (ref 32.0–36.0)
MCV: 86.8 fL (ref 80.0–100.0)
MPV: 10.3 fL (ref 7.5–12.5)
Monocytes Relative: 8 %
NEUTROS ABS: 3003 {cells}/uL (ref 1500–7800)
NEUTROS PCT: 50.9 %
Platelets: 167 10*3/uL (ref 140–400)
RBC: 4.33 10*6/uL (ref 4.20–5.80)
RDW: 13.3 % (ref 11.0–15.0)
Total Lymphocyte: 37.9 %
WBC mixed population: 472 cells/uL (ref 200–950)
WBC: 5.9 10*3/uL (ref 3.8–10.8)

## 2017-11-20 LAB — URINALYSIS, ROUTINE W REFLEX MICROSCOPIC
BILIRUBIN URINE: NEGATIVE
GLUCOSE, UA: NEGATIVE
Hgb urine dipstick: NEGATIVE
Leukocytes, UA: NEGATIVE
Nitrite: NEGATIVE
PROTEIN: NEGATIVE
Specific Gravity, Urine: 1.024 (ref 1.001–1.03)
pH: 5.5 (ref 5.0–8.0)

## 2017-11-20 LAB — COMPLETE METABOLIC PANEL WITH GFR
AG Ratio: 1.6 (calc) (ref 1.0–2.5)
ALT: 18 U/L (ref 9–46)
AST: 21 U/L (ref 10–35)
Albumin: 4.1 g/dL (ref 3.6–5.1)
Alkaline phosphatase (APISO): 51 U/L (ref 40–115)
BUN/Creatinine Ratio: 13 (calc) (ref 6–22)
BUN: 20 mg/dL (ref 7–25)
CALCIUM: 9.4 mg/dL (ref 8.6–10.3)
CO2: 28 mmol/L (ref 20–32)
CREATININE: 1.51 mg/dL — AB (ref 0.70–1.25)
Chloride: 107 mmol/L (ref 98–110)
GFR, EST NON AFRICAN AMERICAN: 49 mL/min/{1.73_m2} — AB (ref >=60)
GFR, Est African American: 57 mL/min/{1.73_m2} — ABNORMAL LOW (ref >=60)
GLOBULIN: 2.5 g/dL (ref 1.9–3.7)
Glucose, Bld: 87 mg/dL (ref 65–99)
Potassium: 4.2 mmol/L (ref 3.5–5.3)
Sodium: 143 mmol/L (ref 135–146)
Total Bilirubin: 0.8 mg/dL (ref 0.2–1.2)
Total Protein: 6.6 g/dL (ref 6.1–8.1)

## 2017-11-20 LAB — MICROALBUMIN / CREATININE URINE RATIO
Creatinine, Urine: 241 mg/dL (ref 20–320)
Microalb Creat Ratio: 2 mcg/mg creat (ref <30)
Microalb, Ur: 0.4 mg/dL

## 2017-11-20 LAB — TESTOSTERONE: TESTOSTERONE: 238 ng/dL — AB (ref 250–827)

## 2017-11-20 LAB — LIPID PANEL
CHOL/HDL RATIO: 2.9 (calc) (ref <5.0)
CHOLESTEROL: 118 mg/dL (ref <200)
HDL: 41 mg/dL (ref >40)
LDL CHOLESTEROL (CALC): 56 mg/dL
Non-HDL Cholesterol (Calc): 77 mg/dL (calc) (ref <130)
TRIGLYCERIDES: 127 mg/dL (ref <150)

## 2017-11-20 LAB — IRON, TOTAL/TOTAL IRON BINDING CAP
%SAT: 19 % (calc) — ABNORMAL LOW (ref 20–48)
Iron: 54 ug/dL (ref 50–180)
TIBC: 287 ug/dL (ref 250–425)

## 2017-11-20 LAB — MAGNESIUM: Magnesium: 2 mg/dL (ref 1.5–2.5)

## 2017-11-20 LAB — HEMOGLOBIN A1C
Hgb A1c MFr Bld: 6.2 % of total Hgb — ABNORMAL HIGH (ref <5.7)
Mean Plasma Glucose: 131 (calc)
eAG (mmol/L): 7.3 (calc)

## 2017-11-20 LAB — VITAMIN D 25 HYDROXY (VIT D DEFICIENCY, FRACTURES): VIT D 25 HYDROXY: 108 ng/mL — AB (ref 30–100)

## 2017-11-20 LAB — PSA: PSA: 2.2 ng/mL (ref <=4.0)

## 2017-11-20 LAB — TSH: TSH: 1.82 mIU/L (ref 0.40–4.50)

## 2017-11-20 LAB — INSULIN, RANDOM: Insulin: 18 u[IU]/mL (ref 2.0–19.6)

## 2017-11-20 LAB — VITAMIN B12: Vitamin B-12: 482 pg/mL (ref 200–1100)

## 2017-11-22 ENCOUNTER — Encounter: Payer: Self-pay | Admitting: *Deleted

## 2017-11-22 LAB — TB SKIN TEST
INDURATION: 0 mm
TB SKIN TEST: NEGATIVE

## 2017-11-24 ENCOUNTER — Encounter: Payer: Self-pay | Admitting: Internal Medicine

## 2017-12-17 ENCOUNTER — Other Ambulatory Visit: Payer: Self-pay

## 2017-12-17 DIAGNOSIS — Z1211 Encounter for screening for malignant neoplasm of colon: Secondary | ICD-10-CM

## 2017-12-17 DIAGNOSIS — Z1212 Encounter for screening for malignant neoplasm of rectum: Secondary | ICD-10-CM

## 2017-12-17 LAB — POC HEMOCCULT BLD/STL (HOME/3-CARD/SCREEN)
Card #2 Fecal Occult Blod, POC: NEGATIVE
Card #3 Fecal Occult Blood, POC: NEGATIVE
Fecal Occult Blood, POC: NEGATIVE

## 2018-01-02 ENCOUNTER — Telehealth: Payer: Self-pay | Admitting: *Deleted

## 2018-01-02 NOTE — Telephone Encounter (Signed)
Patient called and states the iron tablet is causing him to be nauseated.  Per Dr Melford Aase, he should get the lowest mg possible and always take with food.  The patient is aware.

## 2018-02-24 ENCOUNTER — Ambulatory Visit: Payer: Self-pay | Admitting: Adult Health

## 2018-05-06 ENCOUNTER — Other Ambulatory Visit: Payer: Self-pay | Admitting: Adult Health

## 2018-05-06 DIAGNOSIS — R350 Frequency of micturition: Secondary | ICD-10-CM

## 2018-05-28 ENCOUNTER — Ambulatory Visit: Payer: Self-pay | Admitting: Internal Medicine

## 2018-05-30 ENCOUNTER — Other Ambulatory Visit: Payer: Self-pay | Admitting: Internal Medicine

## 2018-05-30 DIAGNOSIS — E782 Mixed hyperlipidemia: Secondary | ICD-10-CM

## 2018-07-10 ENCOUNTER — Ambulatory Visit: Payer: Self-pay | Admitting: Internal Medicine

## 2018-08-06 ENCOUNTER — Encounter: Payer: Self-pay | Admitting: Internal Medicine

## 2018-08-06 NOTE — Patient Instructions (Signed)

## 2018-08-06 NOTE — Progress Notes (Signed)
History of Present Illness:      This very nice 63 y.o. MBM presents for 6 month follow up with HTN, HLD, Pre-Diabetes and Vitamin D Deficiency.       Patient is treated for HTN (2005) & BP has been controlled at home. Today's BP is at goal - 120/72. Patient has had no complaints of any cardiac type chest pain, palpitations, dyspnea / orthopnea / PND, dizziness, claudication, or dependent edema.      Hyperlipidemia is controlled with diet & meds. Patient denies myalgias or other med SE's. Current  Lipids were at goal: Lab Results  Component Value Date   CHOL 103 08/07/2018   HDL 37 (L) 08/07/2018   LDLCALC 48 08/07/2018   TRIG 94 08/07/2018   CHOLHDL 2.8 08/07/2018       Also, the patient has Morbid Obesity (BMI 37+) and history of PreDiabetes (A1c 6.4% / 2011) and has had no symptoms of reactive hypoglycemia, diabetic polys, paresthesias or visual blurring.  Last A1c was not at goal: Lab Results  Component Value Date   HGBA1C 6.2 (H) 08/07/2018      Further, the patient also has history of Vitamin D Deficiency ("19" / 2008)  and supplements vitamin D without any suspected side-effects. Last vitamin D was sl elevated & dose as tapered: Lab Results  Component Value Date   VD25OH 86 08/07/2018   Current Outpatient Medications on File Prior to Visit  Medication Sig  . doxazosin (CARDURA) 8 MG tablet TAKE 1 TABLET BY MOUTH  DAILY  . oxybutynin (DITROPAN) 5 MG tablet TAKE 1 TABLET BY MOUTH 3  TIMES A DAY FOR BLADDER  CONTROL  . sildenafil (REVATIO) 20 MG tablet TAKE 2 TO 5 TABLETS 1 HOUR BEFORE NEEDED  . Vitamin D, Ergocalciferol, (DRISDOL) 50000 units CAPS capsule TAKE 1 CAPSULE DAILY FOR  SEVERE VITAMIN D DEFICIENCY  . rosuvastatin (CRESTOR) 40 MG tablet TAKE 1/2 TO 1 TABLET BY  MOUTH DAILY OR AS DIRECTED  FOR CHOLESTEROL (Patient not taking: Reported on 08/07/2018)   No current facility-administered medications on file prior to visit.    No Known Allergies   PMHx:   Past  Medical History:  Diagnosis Date  . Hyperlipidemia   . Hypertension   . Hypogonadism male   . Other abnormal glucose   . Sleep apnea    mild, no cpap  . Unspecified vitamin D deficiency    Immunization History  Administered Date(s) Administered  . PPD Test 05/06/2013, 05/07/2014, 08/18/2015, 10/04/2016, 11/19/2017  . Td 04/02/2005  . Tdap 08/18/2015   Past Surgical History:  Procedure Laterality Date  . COLONOSCOPY    . POLYPECTOMY     FHx:    Reviewed / unchanged SHx:    Reviewed / unchanged   Systems Review:  Constitutional: Denies fever, chills, wt changes, headaches, insomnia, fatigue, night sweats, change in appetite. Eyes: Denies redness, blurred vision, diplopia, discharge, itchy, watery eyes.  ENT: Denies discharge, congestion, post nasal drip, epistaxis, sore throat, earache, hearing loss, dental pain, tinnitus, vertigo, sinus pain, snoring.  CV: Denies chest pain, palpitations, irregular heartbeat, syncope, dyspnea, diaphoresis, orthopnea, PND, claudication or edema. Respiratory: denies cough, dyspnea, DOE, pleurisy, hoarseness, laryngitis, wheezing.  Gastrointestinal: Denies dysphagia, odynophagia, heartburn, reflux, water brash, abdominal pain or cramps, nausea, vomiting, bloating, diarrhea, constipation, hematemesis, melena, hematochezia  or hemorrhoids. Genitourinary: Denies dysuria, frequency, urgency, nocturia, hesitancy, discharge, hematuria or flank pain. Musculoskeletal: Denies arthralgias, myalgias, stiffness, jt. swelling, pain, limping or strain/sprain.  Skin: Denies  pruritus, rash, hives, warts, acne, eczema or change in skin lesion(s). Neuro: No weakness, tremor, incoordination, spasms, paresthesia or pain. Psychiatric: Denies confusion, memory loss or sensory loss. Endo: Denies change in weight, skin or hair change.  Heme/Lymph: No excessive bleeding, bruising or enlarged lymph nodes.  Physical Exam  BP 120/72   Pulse 80   Temp (!) 97.4 F (36.3  C)   Resp 16   Ht 5' 8.5" (1.74 m)   Wt 251 lb 9.6 oz (114.1 kg)   BMI 37.70 kg/m   Appears  well nourished, well groomed  and in no distress.  Eyes: PERRLA, EOMs, conjunctiva no swelling or erythema. Sinuses: No frontal/maxillary tenderness ENT/Mouth: EAC's clear, TM's nl w/o erythema, bulging. Nares clear w/o erythema, swelling, exudates. Oropharynx clear without erythema or exudates. Oral hygiene is good. Tongue normal, non obstructing. Hearing intact.  Neck: Supple. Thyroid not palpable. Car 2+/2+ without bruits, nodes or JVD. Chest: Respirations nl with BS clear & equal w/o rales, rhonchi, wheezing or stridor.  Cor: Heart sounds normal w/ regular rate and rhythm without sig. murmurs, gallops, clicks or rubs. Peripheral pulses normal and equal  without edema.  Abdomen: Soft & bowel sounds normal. Non-tender w/o guarding, rebound, hernias, masses or organomegaly.  Lymphatics: Unremarkable.  Musculoskeletal: Full ROM all peripheral extremities, joint stability, 5/5 strength and normal gait.  Skin: Warm, dry without exposed rashes, lesions or ecchymosis apparent.  Neuro: Cranial nerves intact, reflexes equal bilaterally. Sensory-motor testing grossly intact. Tendon reflexes grossly intact.  Pysch: Alert & oriented x 3.  Insight and judgement nl & appropriate. No ideations.  Assessment and Plan:  1. Essential hypertension  - Continue medication, monitor blood pressure at home.  - Continue DASH diet.  Reminder to go to the ER if any CP,  SOB, nausea, dizziness, severe HA, changes vision/speech.  - CBC with Differential/Platelet - COMPLETE METABOLIC PANEL WITH GFR - Magnesium - TSH  2.  - Continue diet/meds, exercise,& lifestyle modifications.  - Continue monitor periodic cholesterol/liver & renal functions  Hyperlipidemia, mixed  - Lipid panel - TSH  3. Abnormal glucose  - Continue diet, exercise  - Lifestyle modifications.  - Monitor appropriate labs.  - Hemoglobin  A1c - Insulin, random  4. Vitamin D deficiency  - Continue supplementation.  - VITAMIN D 25 Hydroxyl  5. Prediabetes  - Hemoglobin A1c - Insulin, random  6. Morbid obesity (HCC)  (BMI 34.85)  - Lipid panel - TSH - Hemoglobin A1c - Insulin, random  7. Medication management  - CBC with Differential/Platelet - COMPLETE METABOLIC PANEL WITH GFR - Magnesium - Lipid panel - TSH - Hemoglobin A1c - Insulin, random - VITAMIN D 25 Hydroxyl         Discussed  regular exercise, BP monitoring, weight control to achieve/maintain BMI less than 25 and discussed med and SE's. Recommended labs to assess and monitor clinical status with further disposition pending results of labs. I discussed the assessment and treatment plan with the patient. The patient was provided an opportunity to ask questions and all were answered. The patient agreed with the plan and demonstrated an understanding of the instructions. I provided over 27 minutes of exam, counseling, chart review and  complex critical decision making was performed   Kirtland Bouchard, MD

## 2018-08-07 ENCOUNTER — Ambulatory Visit: Payer: 59 | Admitting: Internal Medicine

## 2018-08-07 ENCOUNTER — Other Ambulatory Visit: Payer: Self-pay

## 2018-08-07 VITALS — BP 120/72 | HR 80 | Temp 97.4°F | Resp 16 | Ht 68.5 in | Wt 251.6 lb

## 2018-08-07 DIAGNOSIS — Z79899 Other long term (current) drug therapy: Secondary | ICD-10-CM

## 2018-08-07 DIAGNOSIS — I10 Essential (primary) hypertension: Secondary | ICD-10-CM

## 2018-08-07 DIAGNOSIS — E559 Vitamin D deficiency, unspecified: Secondary | ICD-10-CM | POA: Diagnosis not present

## 2018-08-07 DIAGNOSIS — R7309 Other abnormal glucose: Secondary | ICD-10-CM | POA: Diagnosis not present

## 2018-08-07 DIAGNOSIS — E782 Mixed hyperlipidemia: Secondary | ICD-10-CM | POA: Diagnosis not present

## 2018-08-07 DIAGNOSIS — R7303 Prediabetes: Secondary | ICD-10-CM

## 2018-08-08 LAB — CBC WITH DIFFERENTIAL/PLATELET
Absolute Monocytes: 471 cells/uL (ref 200–950)
Basophils Absolute: 31 cells/uL (ref 0–200)
Basophils Relative: 0.5 %
Eosinophils Absolute: 143 cells/uL (ref 15–500)
Eosinophils Relative: 2.3 %
HCT: 37.1 % — ABNORMAL LOW (ref 38.5–50.0)
Hemoglobin: 12.3 g/dL — ABNORMAL LOW (ref 13.2–17.1)
Lymphs Abs: 2083 cells/uL (ref 850–3900)
MCH: 28.8 pg (ref 27.0–33.0)
MCHC: 33.2 g/dL (ref 32.0–36.0)
MCV: 86.9 fL (ref 80.0–100.0)
MPV: 10.5 fL (ref 7.5–12.5)
Monocytes Relative: 7.6 %
Neutro Abs: 3472 cells/uL (ref 1500–7800)
Neutrophils Relative %: 56 %
Platelets: 166 10*3/uL (ref 140–400)
RBC: 4.27 10*6/uL (ref 4.20–5.80)
RDW: 13.2 % (ref 11.0–15.0)
Total Lymphocyte: 33.6 %
WBC: 6.2 10*3/uL (ref 3.8–10.8)

## 2018-08-08 LAB — LIPID PANEL
Cholesterol: 103 mg/dL (ref <200)
HDL: 37 mg/dL — ABNORMAL LOW (ref >=40)
LDL Cholesterol (Calc): 48 mg/dL (calc)
Non-HDL Cholesterol (Calc): 66 mg/dL (calc) (ref <130)
Total CHOL/HDL Ratio: 2.8 (calc) (ref <5.0)
Triglycerides: 94 mg/dL (ref <150)

## 2018-08-08 LAB — COMPLETE METABOLIC PANEL WITH GFR
AG Ratio: 1.5 (calc) (ref 1.0–2.5)
ALT: 17 U/L (ref 9–46)
AST: 21 U/L (ref 10–35)
Albumin: 4 g/dL (ref 3.6–5.1)
Alkaline phosphatase (APISO): 36 U/L (ref 35–144)
BUN/Creatinine Ratio: 16 (calc) (ref 6–22)
BUN: 22 mg/dL (ref 7–25)
CO2: 29 mmol/L (ref 20–32)
Calcium: 9.2 mg/dL (ref 8.6–10.3)
Chloride: 108 mmol/L (ref 98–110)
Creat: 1.4 mg/dL — ABNORMAL HIGH (ref 0.70–1.25)
GFR, Est African American: 62 mL/min/{1.73_m2} (ref >=60)
GFR, Est Non African American: 53 mL/min/{1.73_m2} — ABNORMAL LOW (ref >=60)
Globulin: 2.6 g/dL (calc) (ref 1.9–3.7)
Glucose, Bld: 124 mg/dL — ABNORMAL HIGH (ref 65–99)
Potassium: 3.8 mmol/L (ref 3.5–5.3)
Sodium: 142 mmol/L (ref 135–146)
Total Bilirubin: 1.1 mg/dL (ref 0.2–1.2)
Total Protein: 6.6 g/dL (ref 6.1–8.1)

## 2018-08-08 LAB — TSH: TSH: 2.07 mIU/L (ref 0.40–4.50)

## 2018-08-08 LAB — HEMOGLOBIN A1C
Hgb A1c MFr Bld: 6.2 % of total Hgb — ABNORMAL HIGH (ref <5.7)
Mean Plasma Glucose: 131 (calc)
eAG (mmol/L): 7.3 (calc)

## 2018-08-08 LAB — INSULIN, RANDOM: Insulin: 145.7 u[IU]/mL — ABNORMAL HIGH

## 2018-08-08 LAB — VITAMIN D 25 HYDROXY (VIT D DEFICIENCY, FRACTURES): Vit D, 25-Hydroxy: 86 ng/mL (ref 30–100)

## 2018-08-08 LAB — MAGNESIUM: Magnesium: 2 mg/dL (ref 1.5–2.5)

## 2018-09-16 ENCOUNTER — Other Ambulatory Visit: Payer: Self-pay | Admitting: Internal Medicine

## 2018-11-09 ENCOUNTER — Other Ambulatory Visit: Payer: Self-pay | Admitting: Adult Health

## 2018-11-09 ENCOUNTER — Other Ambulatory Visit: Payer: Self-pay | Admitting: Internal Medicine

## 2018-11-09 DIAGNOSIS — E782 Mixed hyperlipidemia: Secondary | ICD-10-CM

## 2018-12-23 ENCOUNTER — Encounter: Payer: Self-pay | Admitting: Internal Medicine

## 2018-12-23 DIAGNOSIS — R7309 Other abnormal glucose: Secondary | ICD-10-CM | POA: Insufficient documentation

## 2018-12-23 NOTE — Patient Instructions (Signed)

## 2018-12-23 NOTE — Progress Notes (Signed)
Annual  Screening/Preventative Visit  & Comprehensive Evaluation & Examination     This very nice 63 y.o. MBM  presents for a Screening /Preventative Visit & comprehensive evaluation and management of multiple medical co-morbidities.  Patient has been followed for HTN, HLD, Prediabetes and Vitamin D Deficiency.     HTN predates since 2005. Patient's BP has been controlled at home.  Today's BP is at goal - 134/82. Patient denies any cardiac symptoms as chest pain, palpitations, shortness of breath, dizziness or ankle swelling.     Patient's hyperlipidemia is controlled with diet and medications. Patient denies myalgias or other medication SE's. Last lipids were at goal:  Lab Results  Component Value Date   CHOL 103 08/07/2018   HDL 37 (L) 08/07/2018   LDLCALC 48 08/07/2018   TRIG 94 08/07/2018   CHOLHDL 2.8 08/07/2018      Patient has Morbid Obesity (BMI 37+) and hx/o prediabetes (A1c 6.4% / 2011)  and patient denies reactive hypoglycemic symptoms, visual blurring, diabetic polys or paresthesias. Last A1c was not at goal:  Lab Results  Component Value Date   HGBA1C 6.2 (H) 08/07/2018       Finally, patient has history of Vitamin D Deficiency  ("19" / 2008) and last vitamin D was at goal:  Lab Results  Component Value Date   VD25OH 86 08/07/2018   Current Outpatient Medications on File Prior to Visit  Medication Sig  . doxazosin (CARDURA) 8 MG tablet TAKE 1 TABLET BY MOUTH  DAILY  . Ferrous Sulfate (IRON PO) Take 65 mg by mouth daily.  . meloxicam (MOBIC) 15 MG tablet Take 15 mg by mouth daily.  Marland Kitchen oxybutynin (DITROPAN) 5 MG tablet Take 1 tablet 3 x /day for Bladder Control  . rosuvastatin (CRESTOR) 40 MG tablet Take 1 tablet Daily for Cholesterol  . sildenafil (REVATIO) 20 MG tablet TAKE 2 TO 5 TABLETS 1 HOUR BEFORE NEEDED  . Vitamin D, Ergocalciferol, (DRISDOL) 1.25 MG (50000 UT) CAPS capsule TAKE 1 CAPSULE BY MOUTH  DAILY FOR SEVERE VITAMIN D  DEFICIENCY  . zinc gluconate 50  MG tablet Take 50 mg by mouth daily.   No current facility-administered medications on file prior to visit.    No Known Allergies   Past Medical History:  Diagnosis Date  . Hyperlipidemia   . Hypertension   . Hypogonadism male   . Other abnormal glucose   . Sleep apnea    mild, no cpap  . Unspecified vitamin D deficiency    Health Maintenance  Topic Date Due  . INFLUENZA VACCINE  11/01/2018  . COLONOSCOPY  01/12/2019  . TETANUS/TDAP  08/17/2025  . Hepatitis C Screening  Completed  . HIV Screening  Completed   Immunization History  Administered Date(s) Administered  . PPD Test 05/06/2013, 05/07/2014, 08/18/2015, 10/04/2016, 11/19/2017  . Td 04/02/2005  . Tdap 08/18/2015   Last Colon - 01/11/2014 - Dr Ardis Hughs - Recc 5 yr f/u - due Oct 2020  Past Surgical History:  Procedure Laterality Date  . COLONOSCOPY    . POLYPECTOMY     Family History  Problem Relation Age of Onset  . Heart disease Mother   . Hyperlipidemia Mother   . Hypertension Mother   . Cancer Father        lung, prostate  . Colon cancer Neg Hx    Social History   Socioeconomic History  . Marital status: Married    Spouse name: Thayer Headings  . Number of children: N1 daughter  26 & a GD   . Years of education: Not on file  . Highest education level: Not on file  Occupational History  . Not on file  Tobacco Use  . Smoking status: Never Smoker  . Smokeless tobacco: Never Used  Substance and Sexual Activity  . Alcohol use: No  . Drug use: No  . Sexual activity: Not on file    ROS Constitutional: Denies fever, chills, weight loss/gain, headaches, insomnia,  night sweats or change in appetite. Does c/o fatigue. Eyes: Denies redness, blurred vision, diplopia, discharge, itchy or watery eyes.  ENT: Denies discharge, congestion, post nasal drip, epistaxis, sore throat, earache, hearing loss, dental pain, Tinnitus, Vertigo, Sinus pain or snoring.  Cardio: Denies chest pain, palpitations, irregular heartbeat,  syncope, dyspnea, diaphoresis, orthopnea, PND, claudication or edema Respiratory: denies cough, dyspnea, DOE, pleurisy, hoarseness, laryngitis or wheezing.  Gastrointestinal: Denies dysphagia, heartburn, reflux, water brash, pain, cramps, nausea, vomiting, bloating, diarrhea, constipation, hematemesis, melena, hematochezia, jaundice or hemorrhoids Genitourinary: Denies dysuria, frequency, urgency, nocturia, hesitancy, discharge, hematuria or flank pain Musculoskeletal: Denies arthralgia, myalgia, stiffness, Jt. Swelling, pain, limp or strain/sprain. Denies Falls. Skin: Denies puritis, rash, hives, warts, acne, eczema or change in skin lesion Neuro: No weakness, tremor, incoordination, spasms, paresthesia or pain Psychiatric: Denies confusion, memory loss or sensory loss. Denies Depression. Endocrine: Denies change in weight, skin, hair change, nocturia, and paresthesia, diabetic polys, visual blurring or hyper / hypo glycemic episodes.  Heme/Lymph: No excessive bleeding, bruising or enlarged lymph nodes.  Physical Exam  BP 134/82   Pulse 64   Temp 97.6 F (36.4 C)   Resp 16   Ht 5' 8.5" (1.74 m)   Wt 244 lb (110.7 kg)   BMI 36.56 kg/m   General Appearance: Well nourished and well groomed and in no apparent distress.  Eyes: PERRLA, EOMs, conjunctiva no swelling or erythema, normal fundi and vessels. Sinuses: No frontal/maxillary tenderness ENT/Mouth: EACs patent / TMs  nl. Nares clear without erythema, swelling, mucoid exudates. Oral hygiene is good. No erythema, swelling, or exudate. Tongue normal, non-obstructing. Tonsils not swollen or erythematous. Hearing normal.  Neck: Supple, thyroid not palpable. No bruits, nodes or JVD. Respiratory: Respiratory effort normal.  BS equal and clear bilateral without rales, rhonci, wheezing or stridor. Cardio: Heart sounds are normal with regular rate and rhythm and no murmurs, rubs or gallops. Peripheral pulses are normal and equal bilaterally  without edema. No aortic or femoral bruits. Chest: symmetric with normal excursions and percussion.  Abdomen: Soft, with Nl bowel sounds. Nontender, no guarding, rebound, hernias, masses, or organomegaly.  Lymphatics: Non tender without lymphadenopathy.  Musculoskeletal: Full ROM all peripheral extremities, joint stability, 5/5 strength, and normal gait. Skin: Warm and dry without rashes, lesions, cyanosis, clubbing or  ecchymosis.  Neuro: Cranial nerves intact, reflexes equal bilaterally. Normal muscle tone, no cerebellar symptoms. Sensation intact.  Pysch: Alert and oriented X 3 with normal affect, insight and judgment appropriate.   Assessment and Plan  1. Annual Preventative/Screening Exam   2. Essential hypertension  - EKG 12-Lead - [place], RETROPERITNL ABD,  LTD - Urinalysis, Routine w reflex microscopic - Microalbumin / creatinine urine ratio - CBC with Differential/Platelet - COMPLETE METABOLIC PANEL WITH GFR - Magnesium - TSH  3. Hyperlipidemia, mixed  - EKG 12-Lead - [place], RETROPERITNL ABD,  LTD - Lipid panel - TSH  4. Abnormal glucose  - EKG 12-Lead - [place], RETROPERITNL ABD,  LTD - Hemoglobin A1c - Insulin, random  5. Vitamin D deficiency  -  VITAMIN D 25 Hydroxyl  6. Prediabetes  - EKG 12-Lead - [place], RETROPERITNL ABD,  LTD  7. Obesity (BMI 35.0-39.9 without comorbidity)   8. BPH with obstruction/lower urinary tract symptoms  - PSA  9. Prostate cancer screening  - PSA  10. Screening for colorectal cancer  - Ambulatory referral to Gastroenterology  11. Screening examination for pulmonary tuberculosis  - TB Skin Test  12. Screening for ischemic heart disease  - EKG 12-Lead  13. FH: heart disease  - EKG 12-Lead - [place], RETROPERITNL ABD,  LTD  14. Screening for AAA (aortic abdominal aneurysm)  - [place], RETROPERITNL ABD,  LTD  15. Fatigue, unspecified type  - Iron,Total/Total Iron Binding Cap - Vitamin B12 - Testosterone - CBC with  Differential/Platelet  16. Medication management  - Urinalysis, Routine w reflex microscopic - Microalbumin / creatinine urine ratio - CBC with Differential/Platelet - COMPLETE METABOLIC PANEL WITH GFR - Magnesium - Lipid panel - TSH - Hemoglobin A1c - Insulin, random - VITAMIN D 25 Hydroxyl        Patient was counseled in prudent diet, weight control to achieve/maintain BMI less than 25, BP monitoring, regular exercise and medications as discussed.  Discussed med effects and SE's. Routine screening labs and tests as requested with regular follow-up as recommended. Over 40 minutes of exam, counseling, chart review and high complex critical decision making was performed   Kirtland Bouchard, MD

## 2018-12-24 ENCOUNTER — Encounter: Payer: Self-pay | Admitting: Gastroenterology

## 2018-12-24 ENCOUNTER — Other Ambulatory Visit: Payer: Self-pay

## 2018-12-24 ENCOUNTER — Ambulatory Visit (INDEPENDENT_AMBULATORY_CARE_PROVIDER_SITE_OTHER): Payer: 59 | Admitting: Internal Medicine

## 2018-12-24 VITALS — BP 134/82 | HR 64 | Temp 97.6°F | Resp 16 | Ht 68.5 in | Wt 244.0 lb

## 2018-12-24 DIAGNOSIS — R7309 Other abnormal glucose: Secondary | ICD-10-CM

## 2018-12-24 DIAGNOSIS — R7303 Prediabetes: Secondary | ICD-10-CM

## 2018-12-24 DIAGNOSIS — Z111 Encounter for screening for respiratory tuberculosis: Secondary | ICD-10-CM | POA: Diagnosis not present

## 2018-12-24 DIAGNOSIS — Z136 Encounter for screening for cardiovascular disorders: Secondary | ICD-10-CM | POA: Diagnosis not present

## 2018-12-24 DIAGNOSIS — Z0000 Encounter for general adult medical examination without abnormal findings: Secondary | ICD-10-CM

## 2018-12-24 DIAGNOSIS — N138 Other obstructive and reflux uropathy: Secondary | ICD-10-CM

## 2018-12-24 DIAGNOSIS — N401 Enlarged prostate with lower urinary tract symptoms: Secondary | ICD-10-CM

## 2018-12-24 DIAGNOSIS — Z125 Encounter for screening for malignant neoplasm of prostate: Secondary | ICD-10-CM

## 2018-12-24 DIAGNOSIS — Z8249 Family history of ischemic heart disease and other diseases of the circulatory system: Secondary | ICD-10-CM | POA: Diagnosis not present

## 2018-12-24 DIAGNOSIS — Z1212 Encounter for screening for malignant neoplasm of rectum: Secondary | ICD-10-CM

## 2018-12-24 DIAGNOSIS — E782 Mixed hyperlipidemia: Secondary | ICD-10-CM

## 2018-12-24 DIAGNOSIS — I10 Essential (primary) hypertension: Secondary | ICD-10-CM | POA: Diagnosis not present

## 2018-12-24 DIAGNOSIS — Z1211 Encounter for screening for malignant neoplasm of colon: Secondary | ICD-10-CM

## 2018-12-24 DIAGNOSIS — R5383 Other fatigue: Secondary | ICD-10-CM

## 2018-12-24 DIAGNOSIS — Z79899 Other long term (current) drug therapy: Secondary | ICD-10-CM

## 2018-12-24 DIAGNOSIS — E669 Obesity, unspecified: Secondary | ICD-10-CM

## 2018-12-24 DIAGNOSIS — E559 Vitamin D deficiency, unspecified: Secondary | ICD-10-CM

## 2018-12-24 DIAGNOSIS — Z0001 Encounter for general adult medical examination with abnormal findings: Secondary | ICD-10-CM

## 2018-12-25 ENCOUNTER — Other Ambulatory Visit: Payer: Self-pay | Admitting: Internal Medicine

## 2018-12-25 LAB — CBC WITH DIFFERENTIAL/PLATELET
Absolute Monocytes: 389 cells/uL (ref 200–950)
Basophils Absolute: 27 cells/uL (ref 0–200)
Basophils Relative: 0.4 %
Eosinophils Absolute: 201 cells/uL (ref 15–500)
Eosinophils Relative: 3 %
HCT: 39.6 % (ref 38.5–50.0)
Hemoglobin: 13.1 g/dL — ABNORMAL LOW (ref 13.2–17.1)
Lymphs Abs: 2245 cells/uL (ref 850–3900)
MCH: 28.9 pg (ref 27.0–33.0)
MCHC: 33.1 g/dL (ref 32.0–36.0)
MCV: 87.4 fL (ref 80.0–100.0)
MPV: 10.3 fL (ref 7.5–12.5)
Monocytes Relative: 5.8 %
Neutro Abs: 3839 cells/uL (ref 1500–7800)
Neutrophils Relative %: 57.3 %
Platelets: 163 10*3/uL (ref 140–400)
RBC: 4.53 10*6/uL (ref 4.20–5.80)
RDW: 13.3 % (ref 11.0–15.0)
Total Lymphocyte: 33.5 %
WBC: 6.7 10*3/uL (ref 3.8–10.8)

## 2018-12-25 LAB — URINALYSIS, ROUTINE W REFLEX MICROSCOPIC
Bilirubin Urine: NEGATIVE
Glucose, UA: NEGATIVE
Hgb urine dipstick: NEGATIVE
Ketones, ur: NEGATIVE
Leukocytes,Ua: NEGATIVE
Nitrite: NEGATIVE
Protein, ur: NEGATIVE
Specific Gravity, Urine: 1.027 (ref 1.001–1.03)
pH: 5.5 (ref 5.0–8.0)

## 2018-12-25 LAB — MICROALBUMIN / CREATININE URINE RATIO
Creatinine, Urine: 191 mg/dL (ref 20–320)
Microalb Creat Ratio: 2 mcg/mg creat (ref <30)
Microalb, Ur: 0.4 mg/dL

## 2018-12-25 LAB — COMPLETE METABOLIC PANEL WITH GFR
AG Ratio: 1.4 (calc) (ref 1.0–2.5)
ALT: 18 U/L (ref 9–46)
AST: 19 U/L (ref 10–35)
Albumin: 4.1 g/dL (ref 3.6–5.1)
Alkaline phosphatase (APISO): 51 U/L (ref 35–144)
BUN/Creatinine Ratio: 24 (calc) — ABNORMAL HIGH (ref 6–22)
BUN: 28 mg/dL — ABNORMAL HIGH (ref 7–25)
CO2: 29 mmol/L (ref 20–32)
Calcium: 9.6 mg/dL (ref 8.6–10.3)
Chloride: 105 mmol/L (ref 98–110)
Creat: 1.19 mg/dL (ref 0.70–1.25)
GFR, Est African American: 75 mL/min/{1.73_m2} (ref >=60)
GFR, Est Non African American: 65 mL/min/{1.73_m2} (ref >=60)
Globulin: 2.9 g/dL (calc) (ref 1.9–3.7)
Glucose, Bld: 109 mg/dL — ABNORMAL HIGH (ref 65–99)
Potassium: 3.9 mmol/L (ref 3.5–5.3)
Sodium: 140 mmol/L (ref 135–146)
Total Bilirubin: 0.8 mg/dL (ref 0.2–1.2)
Total Protein: 7 g/dL (ref 6.1–8.1)

## 2018-12-25 LAB — TSH: TSH: 1.87 mIU/L (ref 0.40–4.50)

## 2018-12-25 LAB — HEMOGLOBIN A1C
Hgb A1c MFr Bld: 6.1 % of total Hgb — ABNORMAL HIGH (ref <5.7)
Mean Plasma Glucose: 128 (calc)
eAG (mmol/L): 7.1 (calc)

## 2018-12-25 LAB — VITAMIN D 25 HYDROXY (VIT D DEFICIENCY, FRACTURES): Vit D, 25-Hydroxy: >150 ng/mL — ABNORMAL HIGH (ref 30–100)

## 2018-12-25 LAB — MAGNESIUM: Magnesium: 2 mg/dL (ref 1.5–2.5)

## 2018-12-25 LAB — VITAMIN B12: Vitamin B-12: 532 pg/mL (ref 200–1100)

## 2018-12-25 LAB — LIPID PANEL
Cholesterol: 122 mg/dL (ref <200)
HDL: 43 mg/dL (ref >=40)
LDL Cholesterol (Calc): 58 mg/dL (calc)
Non-HDL Cholesterol (Calc): 79 mg/dL (calc) (ref <130)
Total CHOL/HDL Ratio: 2.8 (calc) (ref <5.0)
Triglycerides: 120 mg/dL (ref <150)

## 2018-12-25 LAB — PSA: PSA: 2.1 ng/mL (ref <=4.0)

## 2018-12-25 LAB — TESTOSTERONE: Testosterone: 316 ng/dL (ref 250–827)

## 2018-12-25 LAB — IRON, TOTAL/TOTAL IRON BINDING CAP
%SAT: 19 % (calc) — ABNORMAL LOW (ref 20–48)
Iron: 54 ug/dL (ref 50–180)
TIBC: 287 mcg/dL (calc) (ref 250–425)

## 2018-12-25 LAB — INSULIN, RANDOM: Insulin: 53.8 u[IU]/mL — ABNORMAL HIGH

## 2018-12-29 NOTE — Progress Notes (Signed)
Patient is aware of lab results and instructions. -E. Welch

## 2019-01-05 ENCOUNTER — Encounter: Payer: Self-pay | Admitting: Gastroenterology

## 2019-02-09 ENCOUNTER — Encounter: Payer: Self-pay | Admitting: Gastroenterology

## 2019-02-09 ENCOUNTER — Ambulatory Visit (AMBULATORY_SURGERY_CENTER): Payer: 59 | Admitting: *Deleted

## 2019-02-09 ENCOUNTER — Other Ambulatory Visit: Payer: Self-pay

## 2019-02-09 VITALS — Temp 97.3°F | Ht 68.5 in | Wt 251.2 lb

## 2019-02-09 DIAGNOSIS — Z1159 Encounter for screening for other viral diseases: Secondary | ICD-10-CM

## 2019-02-09 DIAGNOSIS — Z86010 Personal history of colonic polyps: Secondary | ICD-10-CM

## 2019-02-09 MED ORDER — SUPREP BOWEL PREP KIT 17.5-3.13-1.6 GM/177ML PO SOLN: 1.0000 | Freq: Once | ORAL | 0 refills | Status: AC

## 2019-02-09 NOTE — Progress Notes (Signed)
Patient denies any allergies to egg or soy products. Patient denies complications with anesthesia/sedation.  Patient denies oxygen use at home and denies diet medications.  Patient has mild OSA, does not use CPAP. Emmi instructions for colonoscopy/endoscopy explained and given to patient.

## 2019-02-18 ENCOUNTER — Other Ambulatory Visit: Payer: Self-pay | Admitting: Gastroenterology

## 2019-02-18 ENCOUNTER — Ambulatory Visit (INDEPENDENT_AMBULATORY_CARE_PROVIDER_SITE_OTHER): Payer: 59

## 2019-02-18 DIAGNOSIS — Z1159 Encounter for screening for other viral diseases: Secondary | ICD-10-CM

## 2019-02-18 LAB — SARS CORONAVIRUS 2 (TAT 6-24 HRS): SARS Coronavirus 2: NEGATIVE

## 2019-02-23 ENCOUNTER — Ambulatory Visit (AMBULATORY_SURGERY_CENTER): Payer: 59 | Admitting: Gastroenterology

## 2019-02-23 ENCOUNTER — Encounter: Payer: Self-pay | Admitting: Gastroenterology

## 2019-02-23 ENCOUNTER — Other Ambulatory Visit: Payer: Self-pay

## 2019-02-23 VITALS — BP 121/85 | HR 66 | Temp 97.5°F | Resp 15 | Ht 68.5 in | Wt 251.2 lb

## 2019-02-23 DIAGNOSIS — K635 Polyp of colon: Secondary | ICD-10-CM

## 2019-02-23 DIAGNOSIS — D125 Benign neoplasm of sigmoid colon: Secondary | ICD-10-CM

## 2019-02-23 DIAGNOSIS — Z86010 Personal history of colonic polyps: Secondary | ICD-10-CM

## 2019-02-23 MED ORDER — SODIUM CHLORIDE 0.9 % IV SOLN: 500.0000 mL | Freq: Once | INTRAVENOUS | Status: DC

## 2019-02-23 NOTE — Progress Notes (Signed)
Called to room to assist during endoscopic procedure.  Patient ID and intended procedure confirmed with present staff. Received instructions for my participation in the procedure from the performing physician.  

## 2019-02-23 NOTE — Progress Notes (Signed)
Report to PACU, RN, vss, BBS= Clear.  

## 2019-02-23 NOTE — Patient Instructions (Signed)
Thank you for letting us take care of your healthcare needs today. Please see handouts given on Polyps.   YOU HAD AN ENDOSCOPIC PROCEDURE TODAY AT [facility]:   Refer to the procedure report that was given to you for any specific questions about what was found during the examination.  If the procedure report does not answer your questions, please call your gastroenterologist to clarify.  If you requested that your care partner not be given the details of your procedure findings, then the procedure report has been included in a sealed envelope for you to review at your convenience later.  YOU SHOULD EXPECT: Some feelings of bloating in the abdomen. Passage of more gas than usual.  Walking can help get rid of the air that was put into your GI tract during the procedure and reduce the bloating. If you had a lower endoscopy (such as a colonoscopy or flexible sigmoidoscopy) you may notice spotting of blood in your stool or on the toilet paper. If you underwent a bowel prep for your procedure, you may not have a normal bowel movement for a few days.  Please Note:  You might notice some irritation and congestion in your nose or some drainage.  This is from the oxygen used during your procedure.  There is no need for concern and it should clear up in a day or so.  SYMPTOMS TO REPORT IMMEDIATELY:   Following lower endoscopy (colonoscopy or flexible sigmoidoscopy):  Excessive amounts of blood in the stool  Significant tenderness or worsening of abdominal pains  Swelling of the abdomen that is new, acute  Fever of 100F or higher   For urgent or emergent issues, a gastroenterologist can be reached at any hour by calling [phone].   DIET:  We do recommend a small meal at first, but then you may proceed to your regular diet.  Drink plenty of fluids but you should avoid alcoholic beverages for 24 hours.  ACTIVITY:  You should plan to take it easy for the rest of today and you  should NOT DRIVE or use heavy machinery until tomorrow (because of the sedation medicines used during the test).    FOLLOW UP: Our staff will call the number listed on your records 48-72 hours following your procedure to check on you and address any questions or concerns that you may have regarding the information given to you following your procedure. If we do not reach you, we will leave a message.  We will attempt to reach you two times.  During this call, we will ask if you have developed any symptoms of COVID 19. If you develop any symptoms (ie: fever, flu-like symptoms, shortness of breath, cough etc.) before then, please call [phone].  If you test positive for Covid 19 in the 2 weeks post procedure, please call and report this information to [place].    If any biopsies were taken you will be contacted by phone or by letter within the next 1-3 weeks.  Please call us at [phone] if you have not heard about the biopsies in 3 weeks.    SIGNATURES/CONFIDENTIALITY: You and/or your care partner have signed paperwork which will be entered into your electronic medical record.  These signatures attest to the fact that that the information above on your After Visit Summary has been reviewed and is understood.  Full responsibility of the confidentiality of this discharge information lies with you and/or your care-partner.

## 2019-02-23 NOTE — Op Note (Signed)
[facility] Patient Name: Gabriel Livingston Procedure Date: 02/23/2019 8:28 AM MRN: [id number] Endoscopist: Milus Banister , MD Age: 63 Referring MD:  Date of Birth: [birth year] Gender: Male Account #: [id number] Procedure:                Colonoscopy Indications:              High risk colon cancer surveillance: Personal                            history of colonic polyps; colonoscopy 2007 1.7cm                            TA; colonoscopy 2010 no polyps; colonoscopy 2015                            single subCM adenoma Medicines:                Monitored Anesthesia Care Procedure:                Pre-Anesthesia Assessment:                           - Prior to the procedure, a History and Physical                            was performed, and patient medications and                            allergies were reviewed. The patient's tolerance of                            previous anesthesia was also reviewed. The risks                            and benefits of the procedure and the sedation                            options and risks were discussed with the patient.                            All questions were answered, and informed consent                            was obtained. Prior Anticoagulants: The patient has                            taken no previous anticoagulant or antiplatelet                            agents. ASA Grade Assessment: II - A patient with                            mild systemic disease. After reviewing the risks  and benefits, the patient was deemed in                            satisfactory condition to undergo the procedure.                           After obtaining informed consent, the colonoscope                            was passed under direct vision. Throughout the                            procedure, the patient's blood pressure, pulse, and                            oxygen saturations were monitored  continuously. The                            Colonoscope was introduced through the anus and                            advanced to the the cecum, identified by                            appendiceal orifice and ileocecal valve. The                            colonoscopy was performed without difficulty. The                            patient tolerated the procedure well. The quality                            of the bowel preparation was good. The ileocecal                            valve, appendiceal orifice, and rectum were                            photographed. Scope In: 8:35:36 AM Scope Out: 8:48:19 AM Scope Withdrawal Time: 0 hours 10 minutes 59 seconds  Total Procedure Duration: 0 hours 12 minutes 43 seconds  Findings:                 A 3 mm polyp was found in the sigmoid colon. The                            polyp was sessile. The polyp was removed with a                            cold snare. Resection and retrieval were complete.                           The exam was otherwise without abnormality on  direct and retroflexion views. Complications:            No immediate complications. Estimated blood loss:                            None. Estimated Blood Loss:     Estimated blood loss: none. Impression:               - One 3 mm polyp in the sigmoid colon, removed with                            a cold snare. Resected and retrieved.                           - The examination was otherwise normal on direct                            and retroflexion views. Recommendation:           - Patient has a contact number available for                            emergencies. The signs and symptoms of potential                            delayed complications were discussed with the                            patient. Return to normal activities tomorrow.                            Written discharge instructions were provided to the                             patient.                           - Resume previous diet.                           - Continue present medications.                           - Await pathology results. Milus Banister, MD 02/23/2019 8:51:22 AM This report has been signed electronically.

## 2019-02-23 NOTE — Progress Notes (Signed)
VS- Gabriel Livingston Temperature- Gabriel Livingston  Pt's states no medical or surgical changes since previsit or office visit.

## 2019-02-25 ENCOUNTER — Telehealth: Payer: Self-pay

## 2019-02-25 NOTE — Telephone Encounter (Signed)
  Follow up Call-  Call back number 02/23/2019  Post procedure Call Back phone  # [phone]  Permission to leave phone message Yes  Some recent data might be hidden     Patient questions:  Do you have a fever, pain , or abdominal swelling? No. Pain Score  0 *  Have you tolerated food without any problems? Yes.    Have you been able to return to your normal activities? Yes.    Do you have any questions about your discharge instructions: Diet   No. Medications  No. Follow up visit  No.  Do you have questions or concerns about your Care? No.  Actions: * If pain score is 4 or above: No action needed, pain <4.  1. Have you developed a fever since your procedure? no  2.   Have you had an respiratory symptoms (SOB or cough) since your procedure? no  3.   Have you tested positive for COVID 19 since your procedure no  4.   Have you had any family members/close contacts diagnosed with the COVID 19 since your procedure?  no   If yes to any of these questions please route to Joylene John, RN and Alphonsa Gin, Therapist, sports.

## 2019-02-28 ENCOUNTER — Encounter: Payer: Self-pay | Admitting: Gastroenterology

## 2019-03-28 NOTE — Progress Notes (Deleted)
FOLLOW UP  Assessment and Plan:   Hypertension Well controlled with current medications  Monitor blood pressure at home; patient to call if consistently greater than 130/80 Continue DASH diet.   Reminder to go to the ER if any CP, SOB, nausea, dizziness, severe HA, changes vision/speech, left arm numbness and tingling and jaw pain.  Cholesterol Currently at goal; continue statin Continue low cholesterol diet and exercise.  Check lipid panel.   Prediabetes Continue diet and exercise.  Perform daily foot/skin check, notify office of any concerning changes.  Check A1C ***  Morbid obesity - BMI *** with co morbidities (prediabetes, hyperlipidemia, htn) Long discussion about weight loss, diet, and exercise Recommended diet heavy in fruits and veggies and low in animal meats, cheeses, and dairy products, appropriate calorie intake Discussed ideal weight for height (below ***) and initial weight goal (***) Patient will work on *** Will follow up in 3 months  Vitamin D Def Above goal at last visit; *** continue supplementation to maintain goal of 70-100 Check Vit D level  Continue diet and meds as discussed. Further disposition pending results of labs. Discussed med's effects and SE's.   Over 30 minutes of exam, counseling, chart review, and critical decision making was performed.   Future Appointments  Date Time Provider [place]  03/30/2019  4:30 PM Liane Comber, NP [facility] None  06/29/2019  4:30 PM Unk Pinto, MD [facility] None  01/25/2020  3:00 PM Unk Pinto, MD [facility] None    ----------------------------------------------------------------------------------------------------------------------  HPI 63 y.o. male  presents for 3 month follow up on hypertension, cholesterol, prediabetes, morbid obesity and vitamin D deficiency.   BMI is There is no height or weight on file to calculate BMI., he {HAS HAS [id number] been working on diet and  exercise. Wt Readings from Last 3 Encounters:  02/23/19 251 lb 3.2 oz (113.9 kg)  02/09/19 251 lb 3.2 oz (113.9 kg)  12/24/18 244 lb (110.7 kg)   His blood pressure {HAS HAS NOT:18834} been controlled at home, today their BP is    He {DOES_DOES [id number] workout. He denies chest pain, shortness of breath, dizziness.   He is on cholesterol medication {Cholesterol meds:21887} and denies myalgias. His cholesterol is at goal. The cholesterol last visit was:   Lab Results  Component Value Date   CHOL 122 12/24/2018   HDL 43 12/24/2018   LDLCALC 58 12/24/2018   TRIG 120 12/24/2018   CHOLHDL 2.8 12/24/2018    He {Has/has not:18111} been working on diet and exercise for prediabetes, and denies {Symptoms; diabetes w/o none:19199}. Last A1C in the office was:  Lab Results  Component Value Date   HGBA1C 6.1 (H) 12/24/2018     Lab Results  Component Value Date   GFRNONAA 65 12/24/2018   Patient is on Vitamin D supplement.   Lab Results  Component Value Date   VD25OH >150 (H) 12/24/2018        Current Medications:  Current Outpatient Medications on File Prior to Visit  Medication Sig  . doxazosin (CARDURA) 8 MG tablet TAKE 1 TABLET BY MOUTH  DAILY  . ergocalciferol (VITAMIN D2) 1.25 MG (50000 UT) capsule Take 50,000 Units by mouth once a week. On monday  . Ferrous Sulfate (IRON PO) Take 65 mg by mouth daily.  . meloxicam (MOBIC) 15 MG tablet Take 15 mg by mouth daily.  Marland Kitchen oxybutynin (DITROPAN) 5 MG tablet Take 1 tablet 3 x /day for Bladder Control  . rosuvastatin (CRESTOR) 40 MG tablet Take 1  tablet Daily for Cholesterol  . sildenafil (REVATIO) 20 MG tablet TAKE 2 TO 5 TABLETS 1 HOUR BEFORE NEEDED  . zinc gluconate 50 MG tablet Take 50 mg by mouth daily.   No current facility-administered medications on file prior to visit.     Allergies: No Known Allergies   Medical History:  Past Medical History:  Diagnosis Date  . Anemia   . ED (erectile dysfunction)   .  Hyperlipidemia   . Hypertension   . Hypogonadism male   . Other abnormal glucose   . Overactive bladder   . Sleep apnea    mild, no cpap  . Unspecified vitamin D deficiency    Family history- Reviewed and unchanged Social history- Reviewed and unchanged   Review of Systems:  Review of Systems  Constitutional: Negative for malaise/fatigue and weight loss.  HENT: Negative for hearing loss and tinnitus.   Eyes: Negative for blurred vision and double vision.  Respiratory: Negative for cough, shortness of breath and wheezing.   Cardiovascular: Negative for chest pain, palpitations, orthopnea, claudication and leg swelling.  Gastrointestinal: Negative for abdominal pain, blood in stool, constipation, diarrhea, heartburn, melena, nausea and vomiting.  Genitourinary: Negative.   Musculoskeletal: Negative for joint pain and myalgias.  Skin: Negative for rash.  Neurological: Negative for dizziness, tingling, sensory change, weakness and headaches.  Endo/Heme/Allergies: Negative for polydipsia.  Psychiatric/Behavioral: Negative.   All other systems reviewed and are negative.     Physical Exam: There were no vitals taken for this visit. Wt Readings from Last 3 Encounters:  02/23/19 251 lb 3.2 oz (113.9 kg)  02/09/19 251 lb 3.2 oz (113.9 kg)  12/24/18 244 lb (110.7 kg)   General Appearance: Well nourished, in no apparent distress. Eyes: PERRLA, EOMs, conjunctiva no swelling or erythema Sinuses: No Frontal/maxillary tenderness ENT/Mouth: Ext aud canals clear, TMs without erythema, bulging. No erythema, swelling, or exudate on post pharynx.  Tonsils not swollen or erythematous. Hearing normal.  Neck: Supple, thyroid normal.  Respiratory: Respiratory effort normal, BS equal bilaterally without rales, rhonchi, wheezing or stridor.  Cardio: RRR with no MRGs. Brisk peripheral pulses without edema.  Abdomen: Soft, + BS.  Non tender, no guarding, rebound, hernias, masses. Lymphatics: Non  tender without lymphadenopathy.  Musculoskeletal: Full ROM, 5/5 strength, {PSY - GAIT AND STATION:22860} gait Skin: Warm, dry without rashes, lesions, ecchymosis.  Neuro: Cranial nerves intact. No cerebellar symptoms.  Psych: Awake and oriented X 3, normal affect, Insight and Judgment appropriate.    Izora Ribas, NP 11:40 AM [facility]

## 2019-03-30 ENCOUNTER — Ambulatory Visit: Payer: 59 | Admitting: Adult Health

## 2019-04-04 ENCOUNTER — Other Ambulatory Visit: Payer: Self-pay | Admitting: Internal Medicine

## 2019-04-04 DIAGNOSIS — R350 Frequency of micturition: Secondary | ICD-10-CM

## 2019-04-24 ENCOUNTER — Other Ambulatory Visit: Payer: Self-pay | Admitting: Internal Medicine

## 2019-04-24 MED ORDER — TADALAFIL 20 MG PO TABS: ORAL_TABLET | ORAL | 3 refills | Status: AC

## 2019-06-14 ENCOUNTER — Other Ambulatory Visit: Payer: Self-pay

## 2019-06-14 ENCOUNTER — Encounter (HOSPITAL_COMMUNITY): Payer: Self-pay | Admitting: Emergency Medicine

## 2019-06-14 ENCOUNTER — Emergency Department (HOSPITAL_COMMUNITY)
Admission: EM | Admit: 2019-06-14 | Discharge: 2019-06-14 | Disposition: A | Discharge status: Home / Self Care | Payer: 59 | Attending: Emergency Medicine | Admitting: Emergency Medicine

## 2019-06-14 DIAGNOSIS — Z79899 Other long term (current) drug therapy: Secondary | ICD-10-CM | POA: Diagnosis not present

## 2019-06-14 DIAGNOSIS — R030 Elevated blood-pressure reading, without diagnosis of hypertension: Secondary | ICD-10-CM

## 2019-06-14 DIAGNOSIS — I10 Essential (primary) hypertension: Secondary | ICD-10-CM | POA: Diagnosis present

## 2019-06-14 NOTE — ED Triage Notes (Signed)
Per pt, states he is not taking his BP med religiously-states he has been having a headache on and off for about 1 1/2 weeks-no other symptoms

## 2019-06-14 NOTE — Discharge Instructions (Signed)
Please take your blood pressure medications regularly as prescribed.  Please follow-up with your primary care doctor for reevaluation of your blood pressure.  Please return to ED if you have any new or concerning symptoms including chest pain, shortness of breath, swelling of your legs, new, worsening headache, weakness or numbness in your arms or legs

## 2019-06-14 NOTE — ED Provider Notes (Signed)
[facility] DEPT Provider Note   CSN: [id number] Arrival date & time: 06/14/19  1232     History No chief complaint on file.   Gabriel Livingston is a 64 y.o. male.  HPI Patient is a 64 year old gentleman with a history of essential hypertension presented today with complaints of elevated blood pressure.  He denies any chest pain, shortness of breath, leg swelling, abdominal pain or lightheadedness or dizziness.  He does endorse some mild left temporal headache which he states he has had off-and-on for the past week.  He denies any weakness in lower extremities or upper extremities.  Denies any confusion, loss of consciousness, or neuro changes.     Past Medical History:  Diagnosis Date  . Anemia   . ED (erectile dysfunction)   . Hyperlipidemia   . Hypertension   . Hypogonadism male   . Other abnormal glucose   . Overactive bladder   . Sleep apnea    mild, no cpap  . Unspecified vitamin D deficiency     Patient Active Problem List   Diagnosis Date Noted  . Abnormal glucose 12/23/2018  . Fatigue 11/19/2017  . FH: heart disease 11/19/2017  . Morbid obesity ([place]) 05/06/2014  . Vitamin D deficiency 05/06/2013  . Prediabetes 05/06/2013  . Essential hypertension 02/12/2013  . Hyperlipidemia, mixed 02/12/2013    Past Surgical History:  Procedure Laterality Date  . COLONOSCOPY  2015   hx polyp  . POLYPECTOMY     colon polyps       Family History  Problem Relation Age of Onset  . Heart disease Mother   . Hyperlipidemia Mother   . Hypertension Mother   . Cancer Father        lung, prostate  . Colon cancer Neg Hx   . Rectal cancer Neg Hx   . Stomach cancer Neg Hx   . Esophageal cancer Neg Hx     Social History   Tobacco Use  . Smoking status: Never Smoker  . Smokeless tobacco: Never Used  Substance Use Topics  . Alcohol use: No  . Drug use: No    Home Medications Prior to Admission medications   Medication Sig Start  Date End Date Taking? Authorizing Provider  doxazosin (CARDURA) 8 MG tablet Take 1 tablet at Bedtime for BP & Prostate 04/04/19   Unk Pinto, MD  ergocalciferol (VITAMIN D2) 1.25 MG (50000 UT) capsule Take 50,000 Units by mouth once a week. On monday    [provider]  Ferrous Sulfate (IRON PO) Take 65 mg by mouth daily.    [provider]  meloxicam (MOBIC) 15 MG tablet Take 15 mg by mouth daily.    [provider]  oxybutynin (DITROPAN) 5 MG tablet Take 1 tablet 3 x /day for Bladder Control 11/09/18   Unk Pinto, MD  rosuvastatin (CRESTOR) 40 MG tablet Take 1 tablet Daily for Cholesterol 11/09/18   Unk Pinto, MD  tadalafil (CIALIS) 20 MG tablet Take 1/2 to 1 tablet every 2 to 3 days as needed for XXXX 04/24/19   Unk Pinto, MD  zinc gluconate 50 MG tablet Take 50 mg by mouth daily.    [provider]    Allergies    Patient has no known allergies.  Review of Systems   Review of Systems  Constitutional: Negative for chills and fever.  HENT: Negative for congestion.   Eyes: Negative for pain.  Respiratory: Negative for cough and shortness of breath.  Cardiovascular: Negative for chest pain and leg swelling.  Gastrointestinal: Negative for abdominal pain and vomiting.  Genitourinary: Negative for dysuria.  Musculoskeletal: Negative for myalgias.  Skin: Negative for rash.  Neurological: Positive for headaches. Negative for dizziness.    Physical Exam Updated Vital Signs BP (!) 153/100 (BP Location: Left Arm)   Pulse 62   Temp 97.7 F (36.5 C) (Oral)   Resp 18   SpO2 100%   Physical Exam Vitals and nursing note reviewed.  Constitutional:      General: He is not in acute distress.    Appearance: Normal appearance. He is not ill-appearing.     Comments: Patient is pleasant 64 year old male in no acute distress.  Sitting comfortably in bed.  HENT:     Head: Normocephalic and atraumatic.     Mouth/Throat:     Mouth: Mucous  membranes are moist.  Eyes:     General: No scleral icterus.       Right eye: No discharge.        Left eye: No discharge.     Conjunctiva/sclera: Conjunctivae normal.  Cardiovascular:     Rate and Rhythm: Normal rate.  Pulmonary:     Effort: Pulmonary effort is normal.     Breath sounds: No stridor.  Musculoskeletal:     Comments: 5/5 strength all 4 extremities.  Skin:    General: Skin is warm and dry.  Neurological:     Mental Status: He is alert and oriented to person, place, and time. Mental status is at baseline.     Comments: Alert and oriented to self, place, time and event.   Speech is fluent, clear without dysarthria or dysphasia.   Strength 5/5 in upper/lower extremities  Sensation intact in upper/lower extremities   Normal gait.  Negative Romberg. No pronator drift.  Normal finger-to-nose and feet tapping.  CN I not tested  CN II grossly intact visual fields bilaterally. Did not visualize posterior eye.   CN III, IV, VI PERRLA and EOMs intact bilaterally  CN V Intact sensation to sharp and light touch to the face  CN VII facial movements symmetric  CN VIII not tested  CN IX, X no uvula deviation, symmetric rise of soft palate  CN XI 5/5 SCM and trapezius strength bilaterally  CN XII Midline tongue protrusion, symmetric L/R movements      ED Results / Procedures / Treatments   Labs (all labs ordered are listed, but only abnormal results are displayed) Labs Reviewed - No data to display  EKG None  Radiology No results found.  Procedures Procedures (including critical care time)  Medications Ordered in ED Medications - No data to display  ED Course  I have reviewed the triage vital signs and the nursing notes.  Pertinent labs & imaging results that were available during my care of the patient were reviewed by me and considered in my medical decision making (see chart for details).    MDM Rules/Calculators/A&P                      Patient  presents today for elevated blood pressure.  He states that he has been elevated past couple days.  He states that he does not take his blood pressure medication as prescribed.  Patient states he takes his Cardura 3-4 times per week when he is prescribed it daily.  He denies any chest pain, shortness of breath, nausea, vomiting, lightheadedness or weakness.  He does endorse some mild  headache but he states that is consistent with prior headaches he has had in the past.  Denies any neurologic issues.  Physical exam he is neuro intact.  No significant abnormalities.  Recommend patient follow-up with his PCP for blood pressure recheck after starting the medication and taking it regularly as prescribed.  Patient given strict return precautions.  He is agreeable to plan and would prefer to defer head CT at this time.  I have low suspicion for intercranial hemorrhage.   Final Clinical Impression(s) / ED Diagnoses Final diagnoses:  Elevated blood pressure reading  Essential hypertension    Rx / DC Orders ED Discharge Orders    None       Tedd Sias, [place] 06/14/19 1516    Dorie Rank, MD 06/14/19 2155

## 2019-06-22 ENCOUNTER — Other Ambulatory Visit: Payer: Self-pay

## 2019-06-22 ENCOUNTER — Encounter: Payer: Self-pay | Admitting: Internal Medicine

## 2019-06-22 ENCOUNTER — Ambulatory Visit: Payer: 59 | Admitting: Internal Medicine

## 2019-06-22 VITALS — BP 148/94 | HR 84 | Temp 97.2°F | Resp 16 | Ht 68.5 in | Wt 256.2 lb

## 2019-06-22 DIAGNOSIS — I10 Essential (primary) hypertension: Secondary | ICD-10-CM | POA: Diagnosis not present

## 2019-06-22 MED ORDER — BISOPROLOL-HYDROCHLOROTHIAZIDE 10-6.25 MG PO TABS: ORAL_TABLET | ORAL | 1 refills | Status: DC

## 2019-06-22 NOTE — Patient Instructions (Signed)

## 2019-06-22 NOTE — Addendum Note (Signed)
Addended by: Unk Pinto on: 06/22/2019 06:29 PM   Modules accepted: Orders

## 2019-06-22 NOTE — Progress Notes (Addendum)
   History of Present Illness:    Patient is a very nice 64 yo MBM with HTN, HLD, Prediabetes and Vitamin D Deficiency who is on Doxazosin for BP & Prostate who reports BP's have been elevated for the last 2 weeks in the 150-166/90-100 range . Reports associated HA's & denies any CP, palpitations, dyspnea or edema. He did go tho the ER on 06/14/2019 and BP 153/100 and was assessed stable and released for outpatient f/u.   Medication Sig  . doxazosin (CARDURA) 8 MG tablet Take 1 tablet at Bedtime for BP & Prostate  . VITAMIN D 50,000 UT cap Take 50,000 Units by mouth once a week. On monday  . Ferrous Sulfate (IRON PO) Take 65 mg by mouth daily.  . meloxicam (MOBIC) 15 MG tablet Take 15 mg by mouth daily.  Marland Kitchen oxybutynin (DITROPAN) 5 MG tablet Take 1 tablet 3 x /day for Bladder Control  . rosuvastatin (CRESTOR) 40 MG tablet Take 1 tablet Daily for Cholesterol  . tadalafil (CIALIS) 20 MG tablet Take 1/2 to 1 tablet every 2 to 3 days as needed for XXXX  . zinc gluconate 50 MG tablet Take 50 mg by mouth daily.   No Known Allergies  Problem list He has Essential hypertension; Hyperlipidemia, mixed; Vitamin D deficiency; Prediabetes; Morbid obesity ([place]); Fatigue; FH: heart disease; and Abnormal glucose on their problem list.   Observations/Objective:  BP 148/94   P 84   T 97.2 F    R 16   Ht 5' 8.5"1.74 m)   Wt 256 lb   BMI 38.39    Recheck BP  152/94  HEENT - WNL. Neck - supple.  Chest - Clear equal BS. Cor - Nl HS. RRR w/o sig MGR. PP 1(+). No edema. MS- FROM w/o deformities.  Gait Nl. Neuro -  Nl w/o focal abnormalities.  Assessment and Plan:  1. Essential hypertension  - bisoprolol-hydrochlorothiazide (ZIAC) 10-6.25 MG tablet; Take 1 tablet every Morning for BP  Dispense: 90 tablet; Refill: 1  - Discussed meds - effects & SE's. Encouraged BP monitoring        The patient was provided an opportunity to ask questions and all were answered. The patient agreed with the plan  and demonstrated an understanding of the instructions.      The patient was advised to call back or seek an in-person evaluation if the symptoms worsen or if the condition fails to improve as anticipated.  Kirtland Bouchard, MD

## 2019-06-29 ENCOUNTER — Ambulatory Visit: Payer: 59 | Admitting: Internal Medicine

## 2019-06-29 ENCOUNTER — Encounter: Payer: Self-pay | Admitting: Internal Medicine

## 2019-06-29 NOTE — Progress Notes (Signed)
    R  E  S  C  H  E  D  U  L  E  D                                                                                                                                                                                                                                                                                                                         This very nice 64 y.o. MBM  presents for 6 month follow up with HTN, HLD, Pre-Diabetes and Vitamin D Deficiency.       Patient is treated for HTN  (2005) & BP has been controlled at home. Today's  . Patient has had no complaints of any cardiac type chest pain, palpitations, dyspnea / orthopnea / PND, dizziness, claudication, or dependent edema.      Hyperlipidemia is controlled with diet & Rosuvastatin. Patient denies myalgias or other med SE's. Last Lipids were at goal:  Lab Results  Component Value Date   CHOL 122 12/24/2018   HDL 43 12/24/2018   LDLCALC 58 12/24/2018   TRIG 120 12/24/2018   CHOLHDL 2.8 12/24/2018    Also, the patient has history of Morbid Obesity (BMI 37+) and consequent PreDiabetes (6.4% / 2011)  and has had no symptoms of reactive hypoglycemia, diabetic polys, paresthesias or visual blurring.  Last A1c was not at goal:  Lab Results  Component Value Date   HGBA1C 6.1 (H) 12/24/2018       Further, the patient also has history of Vitamin D Deficiency ("19" / 2008)  and supplements vitamin D without any suspected side-effects. Last vitamin D was elevated & dose was decreased:  Lab Results  Component Value Date   VD25OH >150 (H) 12/24/2018

## 2019-06-29 NOTE — Patient Instructions (Signed)

## 2019-09-04 ENCOUNTER — Other Ambulatory Visit: Payer: Self-pay | Admitting: Internal Medicine

## 2019-09-04 DIAGNOSIS — I10 Essential (primary) hypertension: Secondary | ICD-10-CM

## 2019-09-04 MED ORDER — BISOPROLOL-HYDROCHLOROTHIAZIDE 10-6.25 MG PO TABS: ORAL_TABLET | ORAL | 1 refills | Status: DC

## 2019-11-22 ENCOUNTER — Other Ambulatory Visit: Payer: Self-pay | Admitting: Internal Medicine

## 2019-12-14 ENCOUNTER — Other Ambulatory Visit: Payer: Self-pay | Admitting: Internal Medicine

## 2019-12-14 DIAGNOSIS — E782 Mixed hyperlipidemia: Secondary | ICD-10-CM

## 2019-12-14 MED ORDER — ROSUVASTATIN CALCIUM 40 MG PO TABS: ORAL_TABLET | ORAL | 0 refills | Status: DC

## 2020-01-24 ENCOUNTER — Encounter: Payer: Self-pay | Admitting: Internal Medicine

## 2020-01-24 NOTE — Patient Instructions (Signed)

## 2020-01-24 NOTE — Progress Notes (Signed)
Annual  Screening/Preventative Visit  & Comprehensive Evaluation & Examination     This very nice 64 y.o.  MBM  presents for a Screening /Preventative Visit & comprehensive evaluation and management of multiple medical co-morbidities.  Patient has been followed for HTN, HLD, Prediabetes and Vitamin D Deficiency.     HTN predates circa 2005. Patient's BP has been controlled at home.  Today's BP is at goal -  112/76. Patient denies any cardiac symptoms as chest pain, palpitations, shortness of breath, dizziness or ankle swelling.     Patient's hyperlipidemia is controlled with diet and Rosuvastatin.  Patient denies myalgias or other medication SE's. Last lipids were at goal:  Lab Results  Component Value Date   CHOL 122 12/24/2018   HDL 43 12/24/2018   LDLCALC 58 12/24/2018   TRIG 120 12/24/2018   CHOLHDL 2.8 12/24/2018       Patient has Morbid Obesity (BMI 37+) & consequent prediabetes (A1c 6.4% /2011).  Patient denies reactive hypoglycemic symptoms, visual blurring, diabetic polys or paresthesias.  Last A1c was not at goal:  Lab Results  Component Value Date   HGBA1C 6.1 (H) 12/24/2018        Finally, patient has history of Vitamin D Deficiency ("19" /2008) and last vitamin D was elevated & patient was advised to stop Vit D 50,000 weekly and replace with 10,000 units /daily ( which he apparently did not understand to do):  Lab Results  Component Value Date   VD25OH >150 (H) 12/24/2018    Current Outpatient Medications on File Prior to Visit  Medication Sig  . bisoprolol-hctz 10-6.25 MG tablet Take 1 tablet every Morning for BP  . doxazosin (CARDURA) 8 MG tablet Take 1 tablet at Bedtime for BP & Prostate  . VITAMIN D2 1.25 MG (50,000 U) cap Take 50,000 Units b once a week. On sunday  . Ferrous Sulfate (IRON PO) Take 65 mg daily.  . meloxicam (MOBIC) 15 MG tablet Take 15 mg  daily.  Marland Kitchen oxybutynin (DITROPAN) 5 MG tablet TAKE 1 TAB3  TIMES DAILY   . rosuvastatin (CRESTOR) 40 MG  tablet Take 1 tablet Daily for Cholesterol  . tadalafil (CIALIS) 20 MG tablet Take 1/2 to 1 tablet every 2 to 3 days as needed for XXXX  . zinc gluconate 50 MG tablet Take 50 mg by mouth daily.    No Known Allergies   Past Medical History:  Diagnosis Date  . Anemia   . ED (erectile dysfunction)   . Hyperlipidemia   . Hypertension   . Hypogonadism male   . Other abnormal glucose   . Overactive bladder   . Sleep apnea    mild, no cpap  . Unspecified vitamin D deficiency    Health Maintenance  Topic Date Due  . COVID-19 Vaccine (1) Never done  . INFLUENZA VACCINE  Never done  . TETANUS/TDAP  08/17/2025  . COLONOSCOPY  02/22/2026  . Hepatitis C Screening  Completed  . HIV Screening  Completed   Immunization History  Administered Date(s) Administered  . PPD Test 05/06/2013, 05/07/2014, 08/18/2015, 10/04/2016, 11/19/2017, 12/24/2018  . Td 04/02/2005  . Tdap 08/18/2015   Last Colon - 02/23/2019- Dr Ardis Hughs - Recc 7 yr f/u - due Nov /Dec 2027  Past Surgical History:  Procedure Laterality Date  . COLONOSCOPY  2015   hx polyp  . POLYPECTOMY     colon polyps   Family History  Problem Relation Age of Onset  . Heart disease Mother   .  Hyperlipidemia Mother   . Hypertension Mother   . Cancer Father        lung, prostate  . Colon cancer Neg Hx   . Rectal cancer Neg Hx   . Stomach cancer Neg Hx   . Esophageal cancer Neg Hx    Social History   Socioeconomic History  . Marital status: Married    Spouse name: Thayer Headings   . Number of children:  1 daughter & a GD  Occupational History  . Sales  Tobacco Use  . Smoking status: Never Smoker  . Smokeless tobacco: Never Used  Vaping Use  . Vaping Use: Never used  Substance and Sexual Activity  . Alcohol use: No  . Drug use: No  . Sexual activity: Yes   ROS Constitutional: Denies fever, chills, weight loss/gain, headaches, insomnia,  night sweats or change in appetite. Does c/o fatigue. Eyes: Denies redness, blurred  vision, diplopia, discharge, itchy or watery eyes.  ENT: Denies discharge, congestion, post nasal drip, epistaxis, sore throat, earache, hearing loss, dental pain, Tinnitus, Vertigo, Sinus pain or snoring.  Cardio: Denies chest pain, palpitations, irregular heartbeat, syncope, dyspnea, diaphoresis, orthopnea, PND, claudication or edema Respiratory: denies cough, dyspnea, DOE, pleurisy, hoarseness, laryngitis or wheezing.  Gastrointestinal: Denies dysphagia, heartburn, reflux, water brash, pain, cramps, nausea, vomiting, bloating, diarrhea, constipation, hematemesis, melena, hematochezia, jaundice or hemorrhoids Genitourinary: Denies dysuria, frequency, urgency, nocturia, hesitancy, discharge, hematuria or flank pain Musculoskeletal: Denies arthralgia, myalgia, stiffness, Jt. Swelling, pain, limp or strain/sprain. Denies Falls. Skin: Denies puritis, rash, hives, warts, acne, eczema or change in skin lesion Neuro: No weakness, tremor, incoordination, spasms, paresthesia or pain Psychiatric: Denies confusion, memory loss or sensory loss. Denies Depression. Endocrine: Denies change in weight, skin, hair change, nocturia, and paresthesia, diabetic polys, visual blurring or hyper / hypo glycemic episodes.  Heme/Lymph: No excessive bleeding, bruising or enlarged lymph nodes.  Physical Exam  BP 112/76   Pulse (!) 56   Temp (!) 97.1 F (36.2 C)   Resp 16   Ht 5' 8.5" (1.74 m)   Wt 248 lb 12.8 oz (112.9 kg)   SpO2 97%   BMI 37.28 kg/m   General Appearance: Well nourished and well groomed and in no apparent distress.  Eyes: PERRLA, EOMs, conjunctiva no swelling or erythema, normal fundi and vessels. Sinuses: No frontal/maxillary tenderness ENT/Mouth: EACs patent / TMs  nl. Nares clear without erythema, swelling, mucoid exudates. Oral hygiene is good. No erythema, swelling, or exudate. Tongue normal, non-obstructing. Tonsils not swollen or erythematous. Hearing normal.  Neck: Supple, thyroid not  palpable. No bruits, nodes or JVD. Respiratory: Respiratory effort normal.  BS equal and clear bilateral without rales, rhonci, wheezing or stridor. Cardio: Heart sounds are normal with regular rate and rhythm and no murmurs, rubs or gallops. Peripheral pulses are normal and equal bilaterally without edema. No aortic or femoral bruits. Chest: symmetric with normal excursions and percussion.  Abdomen: Soft, with Nl bowel sounds. Nontender, no guarding, rebound, hernias, masses, or organomegaly.  Lymphatics: Non tender without lymphadenopathy.  Musculoskeletal: Full ROM all peripheral extremities, joint stability, 5/5 strength, and normal gait. Skin: Warm and dry without rashes, lesions, cyanosis, clubbing or  ecchymosis.  Neuro: Cranial nerves intact, reflexes equal bilaterally. Normal muscle tone, no cerebellar symptoms. Sensation intact.  Pysch: Alert and oriented X 3 with normal affect, insight and judgment appropriate.   Assessment and Plan  1. Annual Preventative/Screening Exam    2. Essential hypertension  - EKG 12-Lead - [place], RETROPERITNL ABD,  LTD -  Urinalysis, Routine w reflex microscopic - Microalbumin / creatinine urine ratio - CBC with Differential/Platelet - COMPLETE METABOLIC PANEL WITH GFR - Magnesium - TSH  3. Hyperlipidemia, mixed  - EKG 12-Lead - [place], RETROPERITNL ABD,  LTD - Lipid panel - TSH  4. Abnormal glucose  - EKG 12-Lead - [place], RETROPERITNL ABD,  LTD - Hemoglobin A1c - Insulin, random  5. Vitamin D deficiency  - VITAMIN D 25 Hydroxy  - advised to stop Vit D 50,000 u caps &  replace with Vit D 10,000 units /daily   6. Prediabetes  - EKG 12-Lead - [place], RETROPERITNL ABD,  LTD - Hemoglobin A1c - Insulin, random  7. Obesity (BMI 35.0-39.9 without comorbidity)  - TSH  8. BPH with obstruction/lower urinary tract symptoms  - PSA  9. Prostate cancer screening  - PSA  10. Screening examination for pulmonary tuberculosis  - TB Skin  Test  11. Screening for ischemic heart disease  - EKG 12-Lead  12. FH: heart disease  - EKG 12-Lead - [place], RETROPERITNL ABD,  LTD  13. Screening for AAA (aortic abdominal aneurysm)  - [place], RETROPERITNL ABD,  LTD  14. Fatigue  - Iron,Total/Total Iron Binding Cap - Vitamin B12 - Testosterone - CBC with Differential/Platelet - TSH  15. Medication management  - Urinalysis, Routine w reflex microscopic - Microalbumin / creatinine urine ratio - CBC with Differential/Platelet - COMPLETE METABOLIC PANEL WITH GFR - Magnesium - Lipid panel - TSH - Hemoglobin A1c - Insulin, random - VITAMIN D 25 Hydroxy         Patient was counseled in prudent diet, weight control to achieve/maintain BMI less than 25, BP monitoring, regular exercise and medications as discussed.  Discussed med effects and SE's. Routine screening labs and tests as requested with regular follow-up as recommended. Over 40 minutes of exam, counseling, chart review and high complex critical decision making was performed   Kirtland Bouchard, MD

## 2020-01-25 ENCOUNTER — Ambulatory Visit (INDEPENDENT_AMBULATORY_CARE_PROVIDER_SITE_OTHER): Payer: 59 | Admitting: Internal Medicine

## 2020-01-25 ENCOUNTER — Other Ambulatory Visit: Payer: Self-pay

## 2020-01-25 VITALS — BP 112/76 | HR 56 | Temp 97.1°F | Resp 16 | Ht 68.5 in | Wt 248.8 lb

## 2020-01-25 DIAGNOSIS — R7303 Prediabetes: Secondary | ICD-10-CM

## 2020-01-25 DIAGNOSIS — Z0000 Encounter for general adult medical examination without abnormal findings: Secondary | ICD-10-CM

## 2020-01-25 DIAGNOSIS — Z111 Encounter for screening for respiratory tuberculosis: Secondary | ICD-10-CM

## 2020-01-25 DIAGNOSIS — E782 Mixed hyperlipidemia: Secondary | ICD-10-CM

## 2020-01-25 DIAGNOSIS — Z0001 Encounter for general adult medical examination with abnormal findings: Secondary | ICD-10-CM

## 2020-01-25 DIAGNOSIS — R5383 Other fatigue: Secondary | ICD-10-CM

## 2020-01-25 DIAGNOSIS — E559 Vitamin D deficiency, unspecified: Secondary | ICD-10-CM

## 2020-01-25 DIAGNOSIS — R7309 Other abnormal glucose: Secondary | ICD-10-CM

## 2020-01-25 DIAGNOSIS — N138 Other obstructive and reflux uropathy: Secondary | ICD-10-CM

## 2020-01-25 DIAGNOSIS — I10 Essential (primary) hypertension: Secondary | ICD-10-CM

## 2020-01-25 DIAGNOSIS — Z79899 Other long term (current) drug therapy: Secondary | ICD-10-CM

## 2020-01-25 DIAGNOSIS — Z136 Encounter for screening for cardiovascular disorders: Secondary | ICD-10-CM

## 2020-01-25 DIAGNOSIS — Z125 Encounter for screening for malignant neoplasm of prostate: Secondary | ICD-10-CM

## 2020-01-25 DIAGNOSIS — Z8249 Family history of ischemic heart disease and other diseases of the circulatory system: Secondary | ICD-10-CM

## 2020-01-26 LAB — COMPLETE METABOLIC PANEL WITH GFR
AG Ratio: 1.3 (calc) (ref 1.0–2.5)
ALT: 20 U/L (ref 9–46)
AST: 19 U/L (ref 10–35)
Albumin: 3.8 g/dL (ref 3.6–5.1)
Alkaline phosphatase (APISO): 49 U/L (ref 35–144)
BUN: 20 mg/dL (ref 7–25)
CO2: 29 mmol/L (ref 20–32)
Calcium: 9.4 mg/dL (ref 8.6–10.3)
Chloride: 105 mmol/L (ref 98–110)
Creat: 1.07 mg/dL (ref 0.70–1.25)
GFR, Est African American: 85 mL/min/{1.73_m2} (ref >=60)
GFR, Est Non African American: 73 mL/min/{1.73_m2} (ref >=60)
Globulin: 3 g/dL (calc) (ref 1.9–3.7)
Glucose, Bld: 133 mg/dL — ABNORMAL HIGH (ref 65–99)
Potassium: 3.9 mmol/L (ref 3.5–5.3)
Sodium: 141 mmol/L (ref 135–146)
Total Bilirubin: 0.8 mg/dL (ref 0.2–1.2)
Total Protein: 6.8 g/dL (ref 6.1–8.1)

## 2020-01-26 LAB — TESTOSTERONE: Testosterone: 258 ng/dL (ref 250–827)

## 2020-01-26 LAB — CBC WITH DIFFERENTIAL/PLATELET
Absolute Monocytes: 435 cells/uL (ref 200–950)
Basophils Absolute: 30 cells/uL (ref 0–200)
Basophils Relative: 0.6 %
Eosinophils Absolute: 200 cells/uL (ref 15–500)
Eosinophils Relative: 4 %
HCT: 38.1 % — ABNORMAL LOW (ref 38.5–50.0)
Hemoglobin: 12.6 g/dL — ABNORMAL LOW (ref 13.2–17.1)
Lymphs Abs: 2305 cells/uL (ref 850–3900)
MCH: 29.3 pg (ref 27.0–33.0)
MCHC: 33.1 g/dL (ref 32.0–36.0)
MCV: 88.6 fL (ref 80.0–100.0)
MPV: 10.1 fL (ref 7.5–12.5)
Monocytes Relative: 8.7 %
Neutro Abs: 2030 cells/uL (ref 1500–7800)
Neutrophils Relative %: 40.6 %
Platelets: 150 10*3/uL (ref 140–400)
RBC: 4.3 10*6/uL (ref 4.20–5.80)
RDW: 13.1 % (ref 11.0–15.0)
Total Lymphocyte: 46.1 %
WBC: 5 10*3/uL (ref 3.8–10.8)

## 2020-01-26 LAB — URINALYSIS, ROUTINE W REFLEX MICROSCOPIC
Bilirubin Urine: NEGATIVE
Glucose, UA: NEGATIVE
Hgb urine dipstick: NEGATIVE
Ketones, ur: NEGATIVE
Leukocytes,Ua: NEGATIVE
Nitrite: NEGATIVE
Protein, ur: NEGATIVE
Specific Gravity, Urine: 1.016 (ref 1.001–1.03)
pH: 5.5 (ref 5.0–8.0)

## 2020-01-26 LAB — LIPID PANEL
Cholesterol: 111 mg/dL (ref <200)
HDL: 42 mg/dL (ref >=40)
LDL Cholesterol (Calc): 46 mg/dL (calc)
Non-HDL Cholesterol (Calc): 69 mg/dL (calc) (ref <130)
Total CHOL/HDL Ratio: 2.6 (calc) (ref <5.0)
Triglycerides: 143 mg/dL (ref <150)

## 2020-01-26 LAB — TSH: TSH: 2.89 mIU/L (ref 0.40–4.50)

## 2020-01-26 LAB — VITAMIN B12: Vitamin B-12: 486 pg/mL (ref 200–1100)

## 2020-01-26 LAB — IRON, TOTAL/TOTAL IRON BINDING CAP
%SAT: 20 % (calc) (ref 20–48)
Iron: 59 ug/dL (ref 50–180)
TIBC: 292 mcg/dL (calc) (ref 250–425)

## 2020-01-26 LAB — HEMOGLOBIN A1C
Hgb A1c MFr Bld: 6.2 % of total Hgb — ABNORMAL HIGH (ref <5.7)
Mean Plasma Glucose: 131 (calc)
eAG (mmol/L): 7.3 (calc)

## 2020-01-26 LAB — MAGNESIUM: Magnesium: 2.1 mg/dL (ref 1.5–2.5)

## 2020-01-26 LAB — VITAMIN D 25 HYDROXY (VIT D DEFICIENCY, FRACTURES): Vit D, 25-Hydroxy: 117 ng/mL — ABNORMAL HIGH (ref 30–100)

## 2020-01-26 LAB — MICROALBUMIN / CREATININE URINE RATIO
Creatinine, Urine: 117 mg/dL (ref 20–320)
Microalb Creat Ratio: 3 mcg/mg creat (ref <30)
Microalb, Ur: 0.4 mg/dL

## 2020-01-26 LAB — PSA: PSA: 2.13 ng/mL (ref <=4.0)

## 2020-01-26 LAB — INSULIN, RANDOM: Insulin: 143 u[IU]/mL — ABNORMAL HIGH

## 2020-01-26 NOTE — Progress Notes (Signed)
========================================================== ==========================================================  -    Iron & Vitamin B12 - Both Normal & OK  ==========================================================  -  Testosterone - Normal - Please continue Zinc supplements ==========================================================  -  Total Chol = 111 and LDL Chol = 46 - Both  Excellent   - Very low risk for Heart Attack  / Stroke  - You may decrease your Rosuvastatin (Crestor) 40 mg to 1/2 tablet /daily  ==========================================================  - A1c = 6.2%  -  Blood sugar and A1c are  STILL elevated in the borderline and  early or pre-diabetes range which has the same   300% increased risk for heart attack, stroke, cancer and   alzheimer- type vascular dementia as full blown diabetes.   But the good news is that diet, exercise with  weight loss can cure the early diabetes at this point.  - It is very important that you work harder with diet by  avoiding all foods that are white except chicken,   fish & calliflower.  - Avoid white rice  (brown & wild rice is OK),   - Avoid white potatoes  (sweet potatoes in moderation is OK),   White bread or wheat bread or anything made out of   white flour like bagels, donuts, rolls, buns, biscuits, cakes,  - pastries, cookies, pizza crust, and pasta (made from  white flour & egg whites)   - vegetarian pasta or spinach or wheat pasta is OK.  - Multigrain breads like Arnold's, Pepperidge Farm or   multigrain sandwich thins or high fiber breads like   Eureka bread or "Dave's Killer" breads that are  4 to 5 grams fiber per slice !  are best.    Diet, exercise and weight loss can reverse and cure  diabetes in the early stages.   ==========================================================  -  Insulin level = 143 - very high  (Normal is less than 20)   - and   - Elevated Insulin levels shows insulin  resistance   - a sign of early diabetes and associated with a 300 % greater risk   for heart attacks, strokes, cancer & Alzheimer type vascular dementia   - All this can be cured  and prevented with losing weight   - get Dr Fara Olden Fuhrman's book 'the End of Diabetes" and   "the End of Dieting" - and add many years of good health to your life. ==========================================================  -  Vitamin D is too high - Recommend cut back to 10,000 units Every Day  ==========================================================  -  All Else - CBC - Kidneys - Electrolytes - Liver - Magnesium & Thyroid    - all  Normal / OK ==========================================================

## 2020-01-27 LAB — TB SKIN TEST
Induration: 0 mm
TB Skin Test: NEGATIVE

## 2020-02-19 ENCOUNTER — Other Ambulatory Visit: Payer: Self-pay | Admitting: Internal Medicine

## 2020-02-19 DIAGNOSIS — E782 Mixed hyperlipidemia: Secondary | ICD-10-CM

## 2020-04-08 ENCOUNTER — Other Ambulatory Visit: Payer: Self-pay | Admitting: Internal Medicine

## 2020-04-08 DIAGNOSIS — I10 Essential (primary) hypertension: Secondary | ICD-10-CM

## 2020-05-05 ENCOUNTER — Ambulatory Visit: Payer: 59 | Admitting: Adult Health

## 2020-06-06 ENCOUNTER — Other Ambulatory Visit: Payer: Self-pay | Admitting: Internal Medicine

## 2020-06-06 DIAGNOSIS — R350 Frequency of micturition: Secondary | ICD-10-CM

## 2020-08-04 ENCOUNTER — Ambulatory Visit: Payer: 59 | Admitting: Internal Medicine

## 2020-09-01 ENCOUNTER — Ambulatory Visit: Payer: 59 | Admitting: Internal Medicine

## 2020-09-11 ENCOUNTER — Other Ambulatory Visit: Payer: Self-pay | Admitting: Internal Medicine

## 2020-09-11 DIAGNOSIS — M159 Polyosteoarthritis, unspecified: Secondary | ICD-10-CM

## 2020-09-11 MED ORDER — MELOXICAM 15 MG PO TABS: ORAL_TABLET | ORAL | 3 refills | Status: DC

## 2020-09-13 ENCOUNTER — Other Ambulatory Visit: Payer: Self-pay | Admitting: Internal Medicine

## 2020-09-13 MED ORDER — ERGOCALCIFEROL 1.25 MG (50000 UT) PO CAPS
ORAL_CAPSULE | ORAL | 3 refills | Status: DC
Start: 2020-09-13 — End: 2021-08-20

## 2020-10-20 ENCOUNTER — Other Ambulatory Visit: Payer: Self-pay

## 2020-10-20 ENCOUNTER — Encounter: Payer: Self-pay | Admitting: Internal Medicine

## 2020-10-20 ENCOUNTER — Ambulatory Visit (INDEPENDENT_AMBULATORY_CARE_PROVIDER_SITE_OTHER): Payer: 59 | Admitting: Internal Medicine

## 2020-10-20 VITALS — BP 118/74 | HR 55 | Temp 97.5°F | Resp 17 | Ht 68.5 in | Wt 256.2 lb

## 2020-10-20 DIAGNOSIS — E559 Vitamin D deficiency, unspecified: Secondary | ICD-10-CM

## 2020-10-20 DIAGNOSIS — I10 Essential (primary) hypertension: Secondary | ICD-10-CM

## 2020-10-20 DIAGNOSIS — E782 Mixed hyperlipidemia: Secondary | ICD-10-CM | POA: Diagnosis not present

## 2020-10-20 DIAGNOSIS — Z79899 Other long term (current) drug therapy: Secondary | ICD-10-CM

## 2020-10-20 DIAGNOSIS — R7309 Other abnormal glucose: Secondary | ICD-10-CM

## 2020-10-20 NOTE — Patient Instructions (Signed)

## 2020-10-20 NOTE — Progress Notes (Signed)
Future Appointments  Date Time Provider [place]  10/20/2020  4:00 PM Unk Pinto, MD [facility] None  01/30/2021  3:00 PM Unk Pinto, MD [facility] None    History of Present Illness:       This very nice 65 y.o. MBM presents for 6 month follow up with HTN, HLD, Pre-Diabetes and Vitamin D Deficiency.        Patient is treated for HTN & BP has been controlled at home. Today's BP is at goal - 118/74. Patient has had no complaints of any cardiac type chest pain, palpitations, dyspnea / orthopnea / PND, dizziness, claudication, or dependent edema.       Hyperlipidemia is controlled with diet & meds. Patient denies myalgias or other med SE's. Last Lipids were  Lab Results  Component Value Date   CHOL 111 01/25/2020   HDL 42 01/25/2020   LDLCALC 46 01/25/2020   TRIG 143 01/25/2020   CHOLHDL 2.6 01/25/2020     Also, the patient has history of PreDiabetes and has had no symptoms of reactive hypoglycemia, diabetic polys, paresthesias or visual blurring.  Last A1c was   Lab Results  Component Value Date   HGBA1C 6.2 (H) 01/25/2020        Further, the patient also has history of Vitamin D Deficiency and supplements vitamin D without any suspected side-effects. Last vitamin D was   Lab Results  Component Value Date   VD25OH 117 (H) 01/25/2020     Current Outpatient Medications on File Prior to Visit  Medication Sig   bisoprolol-hyctz 10-6.25 MG tablet TAKE 1 TABLET IN  THE MORNING    doxazosin 8 MG tablet TAKE 1 TABLET  AT  BEDTIME    VITAMIN D 50,000 capsule Take 1 tablet                      weekly   Ferrous Sulfate 65 mg  Take  daily.   meloxicam 15 MG tablet Take  1/2 to 1 tablet  Daily   oxybutynin 5 MG tablet TAKE 1 TABLET 3  TIMES DAILY    rosuvastatin 40 MG tablet TAKE 1 TABLET  DAILY    tadalafil (CIALIS) 20 MG tablet Take 1/2 to 1 tablet every 2 to 3 days as needed for XXXX   zinc 50 MG tablet Take daily.    No Known  Allergies   PMHx:   Past Medical History:  Diagnosis Date   Anemia    ED (erectile dysfunction)    Hyperlipidemia    Hypertension    Hypogonadism male    Other abnormal glucose    Overactive bladder    Sleep apnea    mild, no cpap   Unspecified vitamin D deficiency     Immunization History  Administered Date(s) Administered   PPD Test 11/19/2017, 12/24/2018, 01/25/2020   Td 04/02/2005   Tdap 08/18/2015    Past Surgical History:  Procedure Laterality Date   COLONOSCOPY  2015   hx polyp   POLYPECTOMY     colon polyps    FHx:    Reviewed / unchanged  SHx:    Reviewed / unchanged   Systems Review:  Constitutional: Denies fever, chills, wt changes, headaches, insomnia, fatigue, night sweats, change in appetite. Eyes: Denies redness, blurred vision, diplopia, discharge, itchy, watery eyes.  ENT: Denies discharge, congestion, post nasal drip, epistaxis, sore throat, earache, hearing loss, dental pain, tinnitus, vertigo, sinus pain, snoring.  CV:  Denies chest pain, palpitations, irregular heartbeat, syncope, dyspnea, diaphoresis, orthopnea, PND, claudication or edema. Respiratory: denies cough, dyspnea, DOE, pleurisy, hoarseness, laryngitis, wheezing.  Gastrointestinal: Denies dysphagia, odynophagia, heartburn, reflux, water brash, abdominal pain or cramps, nausea, vomiting, bloating, diarrhea, constipation, hematemesis, melena, hematochezia  or hemorrhoids. Genitourinary: Denies dysuria, frequency, urgency, nocturia, hesitancy, discharge, hematuria or flank pain. Musculoskeletal: Denies arthralgias, myalgias, stiffness, jt. swelling, pain, limping or strain/sprain.  Skin: Denies pruritus, rash, hives, warts, acne, eczema or change in skin lesion(s). Neuro: No weakness, tremor, incoordination, spasms, paresthesia or pain. Psychiatric: Denies confusion, memory loss or sensory loss. Endo: Denies change in weight, skin or hair change.  Heme/Lymph: No excessive bleeding,  bruising or enlarged lymph nodes.  Physical Exam  BP 118/74   Pulse (!) 55   Temp (!) 97.5 F (36.4 C)   Resp 17   Ht 5' 8.5" (1.74 m)   Wt 256 lb 3.2 oz (116.2 kg)   SpO2 98%   BMI 38.39 kg/m   Appears  well nourished, well groomed  and in no distress.  Eyes: PERRLA, EOMs, conjunctiva no swelling or erythema. Sinuses: No frontal/maxillary tenderness ENT/Mouth: EAC's clear, TM's nl w/o erythema, bulging. Nares clear w/o erythema, swelling, exudates. Oropharynx clear without erythema or exudates. Oral hygiene is good. Tongue normal, non obstructing. Hearing intact.  Neck: Supple. Thyroid not palpable. Car 2+/2+ without bruits, nodes or JVD. Chest: Respirations nl with BS clear & equal w/o rales, rhonchi, wheezing or stridor.  Cor: Heart sounds normal w/ regular rate and rhythm without sig. murmurs, gallops, clicks or rubs. Peripheral pulses normal and equal  without edema.  Abdomen: Soft & bowel sounds normal. Non-tender w/o guarding, rebound, hernias, masses or organomegaly.  Lymphatics: Unremarkable.  Musculoskeletal: Full ROM all peripheral extremities, joint stability, 5/5 strength and normal gait.  Skin: Warm, dry without exposed rashes, lesions or ecchymosis apparent.  Neuro: Cranial nerves intact, reflexes equal bilaterally. Sensory-motor testing grossly intact. Tendon reflexes grossly intact.  Pysch: Alert & oriented x 3.  Insight and judgement nl & appropriate. No ideations.  Assessment and Plan:  1. Essential hypertension  - Continue medication, monitor blood pressure at home.  - Continue DASH diet.  Reminder to go to the ER if any CP,  SOB, nausea, dizziness, severe HA, changes vision/speech.   - CBC with Differential/Platelet - COMPLETE METABOLIC PANEL WITH GFR - Magnesium - TSH  2. Hyperlipidemia, mixed  - Continue diet/meds, exercise,& lifestyle modifications.  - Continue monitor periodic cholesterol/liver & renal functions    - Lipid panel - TSH  3.  Abnormal glucose  - Continue diet, exercise  - Lifestyle modifications.  - Monitor appropriate labs   - Hemoglobin A1c - Insulin, random  4. Vitamin D deficiency  - Continue supplementation   - VITAMIN D 25 Hydroxy   5. Medication management  - CBC with Differential/Platelet - COMPLETE METABOLIC PANEL WITH GFR - Magnesium - Lipid panel - TSH - Hemoglobin A1c - Insulin, random - VITAMIN D 25 Hydroxy        Discussed  regular exercise, BP monitoring, weight control to achieve/maintain BMI less than 25 and discussed med and SE's. Recommended labs to assess and monitor clinical status with further disposition pending results of labs.  I discussed the assessment and treatment plan with the patient. The patient was provided an opportunity to ask questions and all were answered. The patient agreed with the plan and demonstrated an understanding of the instructions.  I provided over 30 minutes of exam, counseling,  chart review and  complex critical decision making.        The patient was advised to call back or seek an in-person evaluation if the symptoms worsen or if the condition fails to improve as anticipated.   Kirtland Bouchard, MD

## 2020-10-21 LAB — COMPLETE METABOLIC PANEL WITH GFR
AG Ratio: 1.6 (calc) (ref 1.0–2.5)
ALT: 19 U/L (ref 9–46)
AST: 17 U/L (ref 10–35)
Albumin: 4.2 g/dL (ref 3.6–5.1)
Alkaline phosphatase (APISO): 56 U/L (ref 35–144)
BUN/Creatinine Ratio: 18 (calc) (ref 6–22)
BUN: 25 mg/dL (ref 7–25)
CO2: 31 mmol/L (ref 20–32)
Calcium: 9.9 mg/dL (ref 8.6–10.3)
Chloride: 107 mmol/L (ref 98–110)
Creat: 1.42 mg/dL — ABNORMAL HIGH (ref 0.70–1.35)
Globulin: 2.7 g/dL (calc) (ref 1.9–3.7)
Glucose, Bld: 82 mg/dL (ref 65–99)
Potassium: 4.2 mmol/L (ref 3.5–5.3)
Sodium: 143 mmol/L (ref 135–146)
Total Bilirubin: 0.9 mg/dL (ref 0.2–1.2)
Total Protein: 6.9 g/dL (ref 6.1–8.1)
eGFR: 55 mL/min/{1.73_m2} — ABNORMAL LOW (ref >=60)

## 2020-10-21 LAB — CBC WITH DIFFERENTIAL/PLATELET
Absolute Monocytes: 572 cells/uL (ref 200–950)
Basophils Absolute: 30 cells/uL (ref 0–200)
Basophils Relative: 0.5 %
Eosinophils Absolute: 153 cells/uL (ref 15–500)
Eosinophils Relative: 2.6 %
HCT: 38.3 % — ABNORMAL LOW (ref 38.5–50.0)
Hemoglobin: 12.6 g/dL — ABNORMAL LOW (ref 13.2–17.1)
Lymphs Abs: 2590 cells/uL (ref 850–3900)
MCH: 29.4 pg (ref 27.0–33.0)
MCHC: 32.9 g/dL (ref 32.0–36.0)
MCV: 89.3 fL (ref 80.0–100.0)
MPV: 10.4 fL (ref 7.5–12.5)
Monocytes Relative: 9.7 %
Neutro Abs: 2555 cells/uL (ref 1500–7800)
Neutrophils Relative %: 43.3 %
Platelets: 170 10*3/uL (ref 140–400)
RBC: 4.29 10*6/uL (ref 4.20–5.80)
RDW: 13.6 % (ref 11.0–15.0)
Total Lymphocyte: 43.9 %
WBC: 5.9 10*3/uL (ref 3.8–10.8)

## 2020-10-21 LAB — TSH: TSH: 1.68 mIU/L (ref 0.40–4.50)

## 2020-10-21 LAB — LIPID PANEL
Cholesterol: 120 mg/dL (ref <200)
HDL: 43 mg/dL (ref >=40)
LDL Cholesterol (Calc): 55 mg/dL (calc)
Non-HDL Cholesterol (Calc): 77 mg/dL (calc) (ref <130)
Total CHOL/HDL Ratio: 2.8 (calc) (ref <5.0)
Triglycerides: 135 mg/dL (ref <150)

## 2020-10-21 LAB — INSULIN, RANDOM: Insulin: 22 u[IU]/mL — ABNORMAL HIGH

## 2020-10-21 LAB — HEMOGLOBIN A1C
Hgb A1c MFr Bld: 6.3 % of total Hgb — ABNORMAL HIGH (ref <5.7)
Mean Plasma Glucose: 134 mg/dL
eAG (mmol/L): 7.4 mmol/L

## 2020-10-21 LAB — VITAMIN D 25 HYDROXY (VIT D DEFICIENCY, FRACTURES): Vit D, 25-Hydroxy: 107 ng/mL — ABNORMAL HIGH (ref 30–100)

## 2020-10-21 LAB — MAGNESIUM: Magnesium: 2.3 mg/dL (ref 1.5–2.5)

## 2020-10-22 NOTE — Progress Notes (Signed)
============================================================ -   Test results slightly outside the reference range are not unusual. If there is anything important, I will review this with you,  otherwise it is considered normal test values.  If you have further questions,  please do not hesitate to contact me at the office or via My Chart.  ============================================================ ============================================================  - Kidney functions look a little dehydrated    Very important to drink adequate amounts of fluids to prevent permanent damage    - Recommend drink at least 6 bottles (16 ounces) of fluids /water /day = 96 Oz ~100 oz  - 100 oz = 3,000 cc or 3 liters / day  - >> That's 1 &1/2 bottles of a 2 liter soda bottle /day !  ============================================================ ============================================================  -Total Chol = 120   &  LDL Chol = 55   - Both  Excellent   - Very low risk for Heart Attack  / Stroke ============================================================ ============================================================  - A1c = 6.3% - Blood sugar and A1c are elevated in the borderline and  early or pre-diabetes range which has the same   300% increased risk for heart attack, stroke, cancer and   alzheimer- type vascular dementia as full blown diabetes.   But the good news is that diet, exercise with  weight loss can cure the early diabetes at this point.  -  It is very important that you work harder with diet by  avoiding all foods that are white except chicken,   fish & calliflower.  - Avoid white rice  (brown & wild rice is OK),   - Avoid white potatoes  (sweet potatoes in moderation is OK),   White bread or wheat bread or anything made out of   white flour like bagels, donuts, rolls, buns, biscuits, cakes,  - pastries, cookies, pizza crust, and pasta (made from  white flour  & egg whites)   - vegetarian pasta or spinach or wheat pasta is OK.  - Multigrain breads like Arnold's, Pepperidge Farm or   multigrain sandwich thins or high fiber breads like   [place] bread or "Dave's Killer" breads that are  4 to 5 grams fiber per slice !  are best.    Diet, exercise and weight loss can reverse and cure  diabetes in the early stages.    - Diet, exercise and weight loss is very important in the   control and prevention of complications of diabetes which  affects every system in your body, ie.   -Brain - dementia/stroke,  - eyes - glaucoma/blindness,  - heart - heart attack/heart failure,  - kidneys - dialysis,  - stomach - gastric paralysis,  - intestines - malabsorption,  - nerves - severe painful neuritis,  - circulation - gangrene & loss of a leg(s)  - and finally  . . . . . . . . . . . . . . . . . .    - cancer and Alzheimers. ============================================================ ============================================================  - Vitamin D = 107 - Borderline high normal, but Recommend keep dose same  ============================================================ ============================================================  - All Else - CBC -  Electrolytes - Liver - Magnesium & Thyroid    - all  Normal / OK ============================================================ ============================================================

## 2020-10-23 ENCOUNTER — Encounter: Payer: Self-pay | Admitting: Internal Medicine

## 2021-01-29 NOTE — Progress Notes (Signed)
Annual  Screening/Preventative Visit  & Comprehensive Evaluation & Examination  Future Appointments  Date Time Provider [place]  01/30/2021  3:00 PM Unk Pinto, MD [facility] None  01/30/2022  3:00 PM Unk Pinto, MD [facility] None            This very nice 65 y.o. MBM presents for a Screening /Preventative Visit & comprehensive evaluation and management of multiple medical co-morbidities.  Patient has been followed for HTN, HLD, Prediabetes and Vitamin D Deficiency.       HTN predates since  2005. Patient's BP has been controlled at home.  Today's BP is at goal - 140/78. Patient denies any cardiac symptoms as chest pain, palpitations, shortness of breath, dizziness or ankle swelling.       Patient's hyperlipidemia is controlled with diet /Rosuvastatin. Patient denies myalgias or other medication SE's. Last lipids were at goal :  Lab Results  Component Value Date   CHOL 120 10/20/2020   HDL 43 10/20/2020   LDLCALC 55 10/20/2020   TRIG 135 10/20/2020   CHOLHDL 2.8 10/20/2020         Patient has Morbid Obesity (BMI 38.7) & consequent prediabetes (A1c 6.4% /2011) and in Feb 2012 had an A1c 6.5%  dx of T2_DM & he has CKD3a  (GFR 55).  He has been attempting dietary control. Patient denies reactive hypoglycemic symptoms, visual blurring, diabetic polys or paresthesias. Last A1c was not at goal :   Lab Results  Component Value Date   HGBA1C 6.3 (H) 10/20/2020          Finally, patient has history of Vitamin D Deficiency ("19" /2008) and last vitamin D was slightly elevated :   Lab Results  Component Value Date   VD25OH 107 (H) 10/20/2020     Current Outpatient Medications on File Prior to Visit  Medication Sig   bisoprolol-hctz 10-6.25 MG tablet TAKE 1 TABLET  IN  THE MORNING    doxazosin  8 MG tablet TAKE 1 TABLET AT  BEDTIME    VITAMIN D 50,000 u Take 1 tablet  weekly   Ferrous Sulfate  Take 65 mg by mouth daily.   meloxicam 15 MG tablet Take   1/2 to 1 tablet  Daily     oxybutynin 5 MG tablet TAKE 1 TABLET  3  TIMES DAILY    rosuvastatin  40 MG tablet TAKE 1 TABLET Daily    tadalafil 20 MG tablet Take 1/2 to 1 tablet every 2 to 3 days as needed    zinc 50 MG tablet Take daily.    No Known Allergies   Past Medical History:  Diagnosis Date   Anemia    ED (erectile dysfunction)    Hyperlipidemia    Hypertension    Hypogonadism male    Other abnormal glucose    Overactive bladder    Sleep apnea    mild, no cpap   Unspecified vitamin D deficiency      Health Maintenance  Topic Date Due   COVID-19 Vaccine (1) Never done   Pneumonia Vaccine 66+ Years old (1 - PCV) Never done   Zoster Vaccines- Shingrix (1 of 2) Never done   INFLUENZA VACCINE  Never done   TETANUS/TDAP  08/17/2025   COLONOSCOPY  02/22/2026   Hepatitis C Screening  Completed   HIV Screening  Completed   HPV VACCINES  Aged Out     Immunization History  Administered Date(s) Administered   PPD Test 11/19/2017, 12/24/2018, 01/25/2020  Td 04/02/2005   Tdap 08/18/2015    Last Colon - 02/23/2019- Dr Ardis Hughs - Recc 7 yr  f/u - due Nov /Dec 2027    Past Surgical History:  Procedure Laterality Date   COLONOSCOPY  2015   hx polyp   POLYPECTOMY     colon polyps     Family History  Problem Relation Age of Onset   Heart disease Mother    Hyperlipidemia Mother    Hypertension Mother    Cancer Father        lung, prostate   Colon cancer Neg Hx    Rectal cancer Neg Hx    Stomach cancer Neg Hx    Esophageal cancer Neg Hx      Social History   Tobacco Use   Smoking status: Never   Smokeless tobacco: Never  Vaping Use   Vaping Use: Never used  Substance Use Topics   Alcohol use: No   Drug use: No      ROS Constitutional: Denies fever, chills, weight loss/gain, headaches, insomnia,  night sweats or change in appetite. Does c/o fatigue. Eyes: Denies redness, blurred vision, diplopia, discharge, itchy or watery eyes.  ENT: Denies  discharge, congestion, post nasal drip, epistaxis, sore throat, earache, hearing loss, dental pain, Tinnitus, Vertigo, Sinus pain or snoring.  Cardio: Denies chest pain, palpitations, irregular heartbeat, syncope, dyspnea, diaphoresis, orthopnea, PND, claudication or edema Respiratory: denies cough, dyspnea, DOE, pleurisy, hoarseness, laryngitis or wheezing.  Gastrointestinal: Denies dysphagia, heartburn, reflux, water brash, pain, cramps, nausea, vomiting, bloating, diarrhea, constipation, hematemesis, melena, hematochezia, jaundice or hemorrhoids Genitourinary: Denies dysuria, frequency, urgency, nocturia, hesitancy, discharge, hematuria or flank pain Musculoskeletal: Denies arthralgia, myalgia, stiffness, Jt. Swelling, pain, limp or strain/sprain. Denies Falls. Skin: Denies puritis, rash, hives, warts, acne, eczema or change in skin lesion Neuro: No weakness, tremor, incoordination, spasms, paresthesia or pain Psychiatric: Denies confusion, memory loss or sensory loss. Denies Depression. Endocrine: Denies change in weight, skin, hair change, nocturia, and paresthesia, diabetic polys, visual blurring or hyper / hypo glycemic episodes.  Heme/Lymph: No excessive bleeding, bruising or enlarged lymph nodes.   Physical Exam  BP 140/78   Pulse 63   Temp 97.8 F (36.6 C)   Resp 16   Ht 5' 8.5" (1.74 m)   Wt 258 lb 3.2 oz (117.1 kg)   SpO2 96%   BMI 38.7 kg/m   General Appearance:  Over nourished and  and in no apparent distress.  Eyes: PERRLA, EOMs, conjunctiva no swelling or erythema, normal fundi and vessels. Sinuses: No frontal/maxillary tenderness ENT/Mouth: EACs patent / TMs  nl. Nares clear without erythema, swelling, mucoid exudates. Oral hygiene is good. No erythema, swelling, or exudate. Tongue normal, non-obstructing. Tonsils not swollen or erythematous. Hearing normal.  Neck: Supple, thyroid not palpable. No bruits, nodes or JVD. Respiratory: Respiratory effort normal.  BS equal  and clear bilateral without rales, rhonci, wheezing or stridor. Cardio: Heart sounds are normal with regular rate and rhythm and no murmurs, rubs or gallops. Peripheral pulses are normal and equal bilaterally without edema. No aortic or femoral bruits. Chest: symmetric with normal excursions and percussion.  Abdomen: Soft, Rotund with Nl bowel sounds. Nontender, no guarding, rebound, hernias, masses, or organomegaly.  Lymphatics: Non tender without lymphadenopathy.  Musculoskeletal: Full ROM all peripheral extremities, joint stability, 5/5 strength, and normal gait. Skin: Warm and dry without rashes, lesions, cyanosis, clubbing or  ecchymosis.  Neuro: Cranial nerves intact, reflexes equal bilaterally. Normal muscle tone, no cerebellar symptoms. Sensation intact.  Pysch: Alert and oriented X 3 with normal affect, insight and judgment appropriate.   Assessment and Plan Annual Preventative Exam   2. Essential hypertension  - EKG 12-Lead - [place], RETROPERITNL ABD,  LTD - Microalbumin / creatinine urine ratio - CBC with Differential/Platelet - COMPLETE METABOLIC PANEL WITH GFR - Magnesium - TSH - Urinalysis, Routine w reflex microscopic  3. Hyperlipidemia associated with type 2 diabetes mellitus ([place])  - EKG 12-Lead - [place], RETROPERITNL ABD,  LTD - Lipid panel - TSH  4. Type 2 diabetes mellitus with stage 3a chronic kidney  disease, without long-term current use of insulin (HCC)  - EKG 12-Lead - [place], RETROPERITNL ABD,  LTD - Microalbumin / creatinine urine ratio - COMPLETE METABOLIC PANEL WITH GFR - Hemoglobin A1c - Insulin, random - Urinalysis, Routine w reflex microscopic - tirzepatide (MOUNJARO) 2.5 MG/0.5ML Pen;  Inject 2.5 mg into the skin once a week.   Dispense: 2 mL; Refill: 0  5. Vitamin D deficiency  - VITAMIN D 25 Hydroxy   6. Impaired fasting glucose  - tirzepatide (MOUNJARO) 2.5 MG/0.5ML Pen;  Inject 2.5 mg into the skin once a week.   Dispense: 2 mL; Refill:  0  7. Class 2 severe obesity due to excess calories with  serious comorbidity in adult, BMI (39.7) (HCC)  - TSH  8. BPH with obstruction/lower urinary tract symptoms  - PSA  9. Screening for colorectal cancer  - POC Hemoccult Bld/Stl   10. Prostate cancer screening  - PSA  11. Screening for ischemic heart disease  - EKG 12-Lead  12. FH: heart disease  - EKG 12-Lead - [place], RETROPERITNL ABD,  LTD  13. Screening for AAA (aortic abdominal aneurysm)  - [place], RETROPERITNL ABD,  LTD  14. Medication management  - Microalbumin / creatinine urine ratio - CBC with Differential/Platelet - COMPLETE METABOLIC PANEL WITH GFR - Magnesium - Lipid panel - TSH - Hemoglobin A1c - Insulin, random - VITAMIN D 25 Hydroxy - Urinalysis, Routine w reflex microscopic          Patient was counseled in prudent diet, weight control to achieve/maintain BMI less than 25, BP monitoring, regular exercise and medications as discussed.  Discussed med effects and SE's. Routine screening labs and tests as requested with regular follow-up as recommended. Over 40 minutes of exam, counseling, chart review and high complex critical decision making was performed   Kirtland Bouchard, MD

## 2021-01-30 ENCOUNTER — Encounter: Payer: Self-pay | Admitting: Internal Medicine

## 2021-01-30 ENCOUNTER — Encounter: Payer: 59 | Admitting: Internal Medicine

## 2021-01-30 ENCOUNTER — Other Ambulatory Visit: Payer: Self-pay

## 2021-01-30 ENCOUNTER — Ambulatory Visit: Payer: 59 | Admitting: Internal Medicine

## 2021-01-30 VITALS — BP 140/78 | HR 63 | Temp 97.8°F | Resp 16 | Ht 68.5 in | Wt 258.2 lb

## 2021-01-30 DIAGNOSIS — Z1211 Encounter for screening for malignant neoplasm of colon: Secondary | ICD-10-CM

## 2021-01-30 DIAGNOSIS — N1831 Chronic kidney disease, stage 3a: Secondary | ICD-10-CM

## 2021-01-30 DIAGNOSIS — Z0000 Encounter for general adult medical examination without abnormal findings: Secondary | ICD-10-CM | POA: Diagnosis not present

## 2021-01-30 DIAGNOSIS — R7301 Impaired fasting glucose: Secondary | ICD-10-CM

## 2021-01-30 DIAGNOSIS — E1169 Type 2 diabetes mellitus with other specified complication: Secondary | ICD-10-CM

## 2021-01-30 DIAGNOSIS — N401 Enlarged prostate with lower urinary tract symptoms: Secondary | ICD-10-CM

## 2021-01-30 DIAGNOSIS — E785 Hyperlipidemia, unspecified: Secondary | ICD-10-CM

## 2021-01-30 DIAGNOSIS — E66812 Obesity, class 2: Secondary | ICD-10-CM

## 2021-01-30 DIAGNOSIS — I10 Essential (primary) hypertension: Secondary | ICD-10-CM

## 2021-01-30 DIAGNOSIS — Z0001 Encounter for general adult medical examination with abnormal findings: Secondary | ICD-10-CM

## 2021-01-30 DIAGNOSIS — Z8249 Family history of ischemic heart disease and other diseases of the circulatory system: Secondary | ICD-10-CM | POA: Diagnosis not present

## 2021-01-30 DIAGNOSIS — E1122 Type 2 diabetes mellitus with diabetic chronic kidney disease: Secondary | ICD-10-CM

## 2021-01-30 DIAGNOSIS — Z136 Encounter for screening for cardiovascular disorders: Secondary | ICD-10-CM | POA: Diagnosis not present

## 2021-01-30 DIAGNOSIS — N138 Other obstructive and reflux uropathy: Secondary | ICD-10-CM

## 2021-01-30 DIAGNOSIS — E1129 Type 2 diabetes mellitus with other diabetic kidney complication: Secondary | ICD-10-CM | POA: Insufficient documentation

## 2021-01-30 DIAGNOSIS — E559 Vitamin D deficiency, unspecified: Secondary | ICD-10-CM

## 2021-01-30 DIAGNOSIS — Z125 Encounter for screening for malignant neoplasm of prostate: Secondary | ICD-10-CM

## 2021-01-30 DIAGNOSIS — Z79899 Other long term (current) drug therapy: Secondary | ICD-10-CM

## 2021-01-30 MED ORDER — TIRZEPATIDE 2.5 MG/0.5ML ~~LOC~~ SOAJ: 2.5000 mg | SUBCUTANEOUS | 0 refills | Status: DC

## 2021-01-30 NOTE — Patient Instructions (Signed)

## 2021-01-31 LAB — URINALYSIS, ROUTINE W REFLEX MICROSCOPIC
Bilirubin Urine: NEGATIVE
Glucose, UA: NEGATIVE
Hgb urine dipstick: NEGATIVE
Ketones, ur: NEGATIVE
Leukocytes,Ua: NEGATIVE
Nitrite: NEGATIVE
Protein, ur: NEGATIVE
Specific Gravity, Urine: 1.023 (ref 1.001–1.035)
pH: 5.5 (ref 5.0–8.0)

## 2021-01-31 LAB — CBC WITH DIFFERENTIAL/PLATELET
Absolute Monocytes: 524 cells/uL (ref 200–950)
Basophils Absolute: 27 cells/uL (ref 0–200)
Basophils Relative: 0.4 %
Eosinophils Absolute: 170 cells/uL (ref 15–500)
Eosinophils Relative: 2.5 %
HCT: 40.5 % (ref 38.5–50.0)
Hemoglobin: 12.9 g/dL — ABNORMAL LOW (ref 13.2–17.1)
Lymphs Abs: 2740 cells/uL (ref 850–3900)
MCH: 28.7 pg (ref 27.0–33.0)
MCHC: 31.9 g/dL — ABNORMAL LOW (ref 32.0–36.0)
MCV: 90.2 fL (ref 80.0–100.0)
MPV: 10.4 fL (ref 7.5–12.5)
Monocytes Relative: 7.7 %
Neutro Abs: 3339 cells/uL (ref 1500–7800)
Neutrophils Relative %: 49.1 %
Platelets: 176 10*3/uL (ref 140–400)
RBC: 4.49 10*6/uL (ref 4.20–5.80)
RDW: 13 % (ref 11.0–15.0)
Total Lymphocyte: 40.3 %
WBC: 6.8 10*3/uL (ref 3.8–10.8)

## 2021-01-31 LAB — COMPLETE METABOLIC PANEL WITH GFR
AG Ratio: 1.5 (calc) (ref 1.0–2.5)
ALT: 17 U/L (ref 9–46)
AST: 19 U/L (ref 10–35)
Albumin: 4.1 g/dL (ref 3.6–5.1)
Alkaline phosphatase (APISO): 48 U/L (ref 35–144)
BUN/Creatinine Ratio: 16 (calc) (ref 6–22)
BUN: 22 mg/dL (ref 7–25)
CO2: 29 mmol/L (ref 20–32)
Calcium: 9.3 mg/dL (ref 8.6–10.3)
Chloride: 106 mmol/L (ref 98–110)
Creat: 1.41 mg/dL — ABNORMAL HIGH (ref 0.70–1.35)
Globulin: 2.8 g/dL (calc) (ref 1.9–3.7)
Glucose, Bld: 98 mg/dL (ref 65–99)
Potassium: 3.8 mmol/L (ref 3.5–5.3)
Sodium: 142 mmol/L (ref 135–146)
Total Bilirubin: 0.7 mg/dL (ref 0.2–1.2)
Total Protein: 6.9 g/dL (ref 6.1–8.1)
eGFR: 55 mL/min/{1.73_m2} — ABNORMAL LOW (ref >=60)

## 2021-01-31 LAB — HEMOGLOBIN A1C
Hgb A1c MFr Bld: 5.9 % of total Hgb — ABNORMAL HIGH (ref <5.7)
Mean Plasma Glucose: 123 mg/dL
eAG (mmol/L): 6.8 mmol/L

## 2021-01-31 LAB — INSULIN, RANDOM: Insulin: 64.6 u[IU]/mL — ABNORMAL HIGH

## 2021-01-31 LAB — PSA: PSA: 3.19 ng/mL (ref <=4.00)

## 2021-01-31 LAB — MICROALBUMIN / CREATININE URINE RATIO
Creatinine, Urine: 166 mg/dL (ref 20–320)
Microalb Creat Ratio: 8 mcg/mg creat (ref <30)
Microalb, Ur: 1.3 mg/dL

## 2021-01-31 LAB — MAGNESIUM: Magnesium: 2 mg/dL (ref 1.5–2.5)

## 2021-01-31 LAB — VITAMIN D 25 HYDROXY (VIT D DEFICIENCY, FRACTURES): Vit D, 25-Hydroxy: 97 ng/mL (ref 30–100)

## 2021-01-31 LAB — LIPID PANEL
Cholesterol: 129 mg/dL (ref <200)
HDL: 42 mg/dL (ref >=40)
LDL Cholesterol (Calc): 64 mg/dL (calc)
Non-HDL Cholesterol (Calc): 87 mg/dL (calc) (ref <130)
Total CHOL/HDL Ratio: 3.1 (calc) (ref <5.0)
Triglycerides: 149 mg/dL (ref <150)

## 2021-01-31 LAB — TSH: TSH: 2.55 mIU/L (ref 0.40–4.50)

## 2021-01-31 NOTE — Progress Notes (Signed)
============================================================ -   Test results slightly outside the reference range are not unusual. If there is anything important, I will review this with you,  otherwise it is considered normal test values.  If you have further questions,  please do not hesitate to contact me at the office or via My Chart.  ============================================================ ============================================================  -  PSA - Low - Great ============================================================ ============================================================  -  Total Chol = 129   &     LDL Chol = 64   -   Both    Excellent   - Very low risk for Heart Attack  / Stroke ============================================================ ============================================================  -  A1c - Better - down from 6.3%  to now 5.9%   (Goal is less than 5.7 %   !  )  ============================================================ ============================================================  -  Vitamin D = 97  - Excellent - Please  keep dose same  ============================================================ ============================================================  - All Else - CBC - Kidneys - Electrolytes - Liver - Magnesium & Thyroid    - all  Normal / OK ============================================================ ============================================================  - Keep up the [place] Work  !  ============================================================ ============================================================

## 2021-02-21 ENCOUNTER — Other Ambulatory Visit: Payer: Self-pay

## 2021-02-21 MED ORDER — OXYBUTYNIN CHLORIDE 5 MG PO TABS: ORAL_TABLET | ORAL | 3 refills | Status: DC

## 2021-02-27 ENCOUNTER — Other Ambulatory Visit: Payer: Self-pay

## 2021-02-27 DIAGNOSIS — Z1211 Encounter for screening for malignant neoplasm of colon: Secondary | ICD-10-CM

## 2021-02-27 DIAGNOSIS — Z1212 Encounter for screening for malignant neoplasm of rectum: Secondary | ICD-10-CM

## 2021-02-27 LAB — POC HEMOCCULT BLD/STL (HOME/3-CARD/SCREEN)
Card #2 Fecal Occult Blod, POC: NEGATIVE
Card #3 Fecal Occult Blood, POC: NEGATIVE
Fecal Occult Blood, POC: NEGATIVE

## 2021-02-28 DIAGNOSIS — Z1212 Encounter for screening for malignant neoplasm of rectum: Secondary | ICD-10-CM | POA: Diagnosis not present

## 2021-02-28 DIAGNOSIS — Z1211 Encounter for screening for malignant neoplasm of colon: Secondary | ICD-10-CM | POA: Diagnosis not present

## 2021-03-09 ENCOUNTER — Encounter: Payer: 59 | Admitting: Nurse Practitioner

## 2021-04-02 ENCOUNTER — Other Ambulatory Visit: Payer: Self-pay | Admitting: Adult Health

## 2021-04-02 DIAGNOSIS — E782 Mixed hyperlipidemia: Secondary | ICD-10-CM

## 2021-06-14 ENCOUNTER — Other Ambulatory Visit: Payer: Self-pay | Admitting: Internal Medicine

## 2021-06-14 DIAGNOSIS — I10 Essential (primary) hypertension: Secondary | ICD-10-CM

## 2021-06-14 MED ORDER — BISOPROLOL-HYDROCHLOROTHIAZIDE 10-6.25 MG PO TABS: ORAL_TABLET | ORAL | 3 refills | Status: DC

## 2021-08-01 ENCOUNTER — Ambulatory Visit: Payer: 59 | Admitting: Nurse Practitioner

## 2021-08-16 ENCOUNTER — Ambulatory Visit: Payer: 59 | Admitting: Nurse Practitioner

## 2021-08-20 ENCOUNTER — Other Ambulatory Visit: Payer: Self-pay | Admitting: Internal Medicine

## 2021-09-04 ENCOUNTER — Ambulatory Visit: Payer: 59 | Admitting: Nurse Practitioner

## 2021-09-11 NOTE — Progress Notes (Signed)
Assessment and Plan:   Essential hypertension - continue medications, DASH diet, exercise and monitor at home. Call if greater than 130/80.  -     CBC with Differential/Platelet -     CMP -     TSH   Hyperlipidemia, mixed -continue medications, check lipids, decrease fatty foods, increase activity.  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  Perform daily foot/skin check, notify office of any concerning changes.  Push fluids throughout the day and limit NSAID use Check A1C     Vitamin D deficiency Continue Vit D supplementation to maintain value in therapeutic level of 60-100    Medication management -     CBC with Differential/Platelet -     CMP - TSH   Morbid obesity - follow up 3 months for progress monitoring - increase veggies, decrease carbs, increase activity - long discussion about weight loss, diet, and exercise    Continue diet and meds as discussed. Further disposition pending results of labs. Discussed med's effects and SE's.   Over 30 minutes of exam, counseling, chart review, and critical decision making was performed.  Future Appointments  Date Time Provider [place]  01/30/2022  3:00 PM Unk Pinto, MD [facility] None     This very nice 66 y.o.male presents for 3 month follow up with Hypertension, Hyperlipidemia, Pre-Diabetes and Vitamin D Deficiency.      Patient is treated for HTN & BP has been controlled at home. Currently on doxazosin 8 mg QD, Bisoprolol/HCTZ 10/6.25 mg qd. Today's BP: 130/84 BP Readings from Last 3 Encounters:  09/12/21 130/84  01/30/21 140/78  10/20/20 118/74  Patient has had no complaints of any cardiac type chest pain, palpitations, dyspnea / orthopnea / PND, dizziness, claudication, or dependent edema.  Hyperlipidemia is controlled with diet & meds,  Rosuvastatin 40 mg QD. Patient denies myalgias or other med SE's. Last Lipids were  Lab Results  Component Value Date   CHOL 129 01/30/2021   HDL 42 01/30/2021   LDLCALC 64 01/30/2021   TRIG 149 01/30/2021   CHOLHDL 3.1 01/30/2021   Also, the patient has abnormal glucose and has had no symptoms of reactive hypoglycemia, diabetic polys, paresthesias or visual blurring. He was taking Mounjaro but never got it filled.  Last A1c was  Lab Results  Component Value Date   HGBA1C 5.9 (H) 01/30/2021   Lab Results  Component Value Date   GFRAA 85 01/25/2020   Further, the patient also has history of Vitamin D Deficiency and supplements vitamin D without any suspected side-effects. Last vitamin D was   Lab Results  Component Value Date   VD25OH 97 01/30/2021   BMI is Body mass index is 38.57 kg/m., he is not working on diet or exercise.  He tries to limit portions.  Limits red meat and tries to limit White bread, white, rice, potatoes and pasta.  Wt Readings from Last 3 Encounters:  09/12/21 257 lb 6.4 oz (116.8 kg)  01/30/21 258 lb 3.2 oz (117.1 kg)  10/20/20 256 lb 3.2 oz (116.2 kg)    Current Outpatient Medications on File Prior to Visit  Medication Sig   bisoprolol-hydrochlorothiazide (ZIAC) 10-6.25 MG tablet Take  1 tablet  Daily  for BP                                                    /  TAKE               BY                  MOUTH   doxazosin (CARDURA) 8 MG tablet TAKE 1 TABLET BY MOUTH AT  BEDTIME FOR BLOOD PRESSURE  &amp; PROSTATE   Ferrous Sulfate (IRON PO) Take 65 mg by mouth daily.   meloxicam (MOBIC) 15 MG tablet Take  1/2 to 1 tablet  Daily  with Food for Pain & Inflammation & try limit to 5 tablets /week to Avoid Kidney Damage   oxybutynin (DITROPAN) 5 MG tablet TAKE 1 TABLET BY MOUTH 3  TIMES DAILY FOR BLADDER  CONTROL   rosuvastatin (CRESTOR) 40 MG tablet Take  1 tablet  Daily  for Cholesterol                                                         /                 TAKE  BY MOUTH   tadalafil (CIALIS) 20 MG tablet Take 1/2 to 1 tablet every 2 to 3 days as needed for XXXX   Vitamin D, Ergocalciferol, (DRISDOL) 1.25 MG (50000 UNIT) CAPS capsule TAKE 1 CAPSULE BY MOUTH  WEEKLY   zinc gluconate 50 MG tablet Take 50 mg by mouth daily.   tirzepatide [facility]) 2.5 MG/0.5ML Pen Inject 2.5 mg into the skin once a week. (Patient not taking: Reported on 09/12/2021)   No current facility-administered medications on file prior to visit.   No Known Allergies PMHx:   Past Medical History:  Diagnosis Date   Anemia    ED (erectile dysfunction)    Hyperlipidemia    Hypertension    Hypogonadism male    Other abnormal glucose    Overactive bladder    Sleep apnea    mild, no cpap   Unspecified vitamin D deficiency    Immunization History  Administered Date(s) Administered   PPD Test 05/06/2013, 05/07/2014, 08/18/2015, 10/04/2016, 11/19/2017, 12/24/2018, 01/25/2020   Pfizer Covid-19 Vaccine Bivalent Booster 60yr & up 02/25/2021   Td 04/02/2005   Tdap 08/18/2015   Past Surgical History:  Procedure Laterality Date   COLONOSCOPY  2015   hx polyp   POLYPECTOMY     colon polyps   FHx:    Reviewed / unchanged  SHx:    Reviewed / unchanged    Physical Exam  BP 130/84   Pulse 76   Temp (!) 96.8 F (36 C)   Wt 257 lb 6.4 oz (116.8 kg)   SpO2 97%   BMI 38.57 kg/m   Appears well nourished, well groomed  and in no distress.  Eyes: PERRLA, EOMs, conjunctiva no swelling or erythema. Sinuses: No frontal/maxillary tenderness ENT/Mouth: EAC's clear, TM's nl w/o erythema, bulging. Nares clear w/o erythema, swelling, exudates. Oropharynx clear without erythema or exudates. Oral hygiene is good. Tongue normal, non obstructing. Hearing intact.  Neck: Supple. Thyroid not palpable. Car 2+/2+ without bruits, nodes or JVD. Chest: Respirations nl with BS clear & equal w/o rales, rhonchi, wheezing or stridor.  Cor: Heart sounds normal  w/ regular rate and rhythm without sig. murmurs, gallops, clicks or rubs. Peripheral pulses normal and equal  without edema.  Abdomen: Soft & bowel sounds normal. Non-tender  w/o guarding, rebound, hernias, masses or organomegaly.  Lymphatics: Unremarkable.  Musculoskeletal: Full ROM all peripheral extremities, joint stability, 5/5 strength and normal gait.  Skin: Warm, dry without exposed rashes, lesions or ecchymosis apparent.  Neuro: Cranial nerves intact, reflexes equal bilaterally. Sensory-motor testing grossly intact. Tendon reflexes grossly intact.  Pysch: Alert & oriented x 3.  Insight and judgement nl & appropriate. No ideations.

## 2021-09-12 ENCOUNTER — Encounter: Payer: Self-pay | Admitting: Nurse Practitioner

## 2021-09-12 ENCOUNTER — Ambulatory Visit: Payer: 59 | Admitting: Nurse Practitioner

## 2021-09-12 VITALS — BP 130/84 | HR 76 | Temp 96.8°F | Wt 257.4 lb

## 2021-09-12 DIAGNOSIS — Z79899 Other long term (current) drug therapy: Secondary | ICD-10-CM

## 2021-09-12 DIAGNOSIS — N1831 Chronic kidney disease, stage 3a: Secondary | ICD-10-CM

## 2021-09-12 DIAGNOSIS — E782 Mixed hyperlipidemia: Secondary | ICD-10-CM

## 2021-09-12 DIAGNOSIS — I10 Essential (primary) hypertension: Secondary | ICD-10-CM

## 2021-09-12 DIAGNOSIS — E559 Vitamin D deficiency, unspecified: Secondary | ICD-10-CM

## 2021-09-12 DIAGNOSIS — E1122 Type 2 diabetes mellitus with diabetic chronic kidney disease: Secondary | ICD-10-CM

## 2021-09-12 DIAGNOSIS — R7309 Other abnormal glucose: Secondary | ICD-10-CM

## 2021-09-12 MED ORDER — MOUNJARO 5 MG/0.5ML ~~LOC~~ SOAJ: 5.0000 mg | SUBCUTANEOUS | 3 refills | Status: DC

## 2021-09-12 NOTE — Patient Instructions (Signed)
Tirzepatide Injection What is this medication? TIRZEPATIDE (tir ZEP a tide) treats type 2 diabetes. It works by increasing insulin levels in your body, which decreases your blood sugar (glucose). Changes to diet and exercise are often combined with this medication. This medicine may be used for other purposes; ask your health care provider or pharmacist if you have questions. COMMON BRAND NAME(S): MOUNJARO What should I tell my care team before I take this medication? They need to know if you have any of these conditions: Endocrine tumors (MEN 2) or if someone in your family had these tumors Eye disease, vision problems Gallbladder disease History of pancreatitis Kidney disease Stomach or intestine problems Thyroid cancer or if someone in your family had thyroid cancer An unusual or allergic reaction to tirzepatide, other medications, foods, dyes, or preservatives Pregnant or trying to get pregnant Breast-feeding How should I use this medication? This medication is injected under the skin. You will be taught how to prepare and give it. It is given once every week (every 7 days). Keep taking it unless your health care provider tells you to stop. If you use this medication with insulin, you should inject this medication and the insulin separately. Do not mix them together. Do not give the injections right next to each other. Change (rotate) injection sites with each injection. This medication comes with INSTRUCTIONS FOR USE. Ask your pharmacist for directions on how to use this medication. Read the information carefully. Talk to your pharmacist or care team if you have questions. It is important that you put your used needles and syringes in a special sharps container. Do not put them in a trash can. If you do not have a sharps container, call your pharmacist or care team to get one. A special MedGuide will be given to you by the pharmacist with each prescription and refill. Be sure to read this  information carefully each time. Talk to your care team about the use of this medication in children. Special care may be needed. Overdosage: If you think you have taken too much of this medicine contact a poison control center or emergency room at once. NOTE: This medicine is only for you. Do not share this medicine with others. What if I miss a dose? If you miss a dose, take it as soon as you can unless it is more than 4 days (96 hours) late. If it is more than 4 days late, skip the missed dose. Take the next dose at the normal time. Do not take 2 doses within 3 days of each other. What may interact with this medication? Alcohol Antiviral medications for HIV or AIDS Aspirin and aspirin-like medications Beta blockers, such as atenolol, metoprolol, propranolol Certain medications for blood pressure, heart disease, irregular heart beat Chromium Clonidine Diuretics Estrogen or progestin hormones Fenofibrate Gemfibrozil Guanethidine Isoniazid Lanreotide Male hormones or anabolic steroids MAOIs, such as Marplan, Nardil, and Parnate Medications for weight loss Medications for allergies, asthma, cold, or cough Medications for depression, anxiety, or mental health conditions Niacin Nicotine NSAIDs, medications for pain and inflammation, such as ibuprofen or naproxen Octreotide Other medications for diabetes, such as glyburide, glipizide, or glimepiride Pasireotide Pentamidine Phenytoin Probenecid Quinolone antibiotics, such as ciprofloxacin, levofloxacin, ofloxacin Reserpine Some herbal dietary supplements Steroid medications, such as prednisone or cortisone Sulfamethoxazole; trimethoprim Thyroid hormones Warfarin This list may not describe all possible interactions. Give your health care provider a list of all the medicines, herbs, non-prescription drugs, or dietary supplements you use. Also tell them   if you smoke, drink alcohol, or use illegal drugs. Some items may interact with  your medicine. What should I watch for while using this medication? Visit your care team for regular checks on your progress. Drink plenty of fluids while taking this medication. Check with your care team if you get an attack of severe diarrhea, nausea, and vomiting. The loss of too much body fluid can make it dangerous for you to take this medication. A test called the HbA1C (A1C) will be monitored. This is a simple blood test. It measures your blood sugar control over the last 2 to 3 months. You will receive this test every 3 to 6 months. Learn how to check your blood sugar. Learn the symptoms of low and high blood sugar and how to manage them. Always carry a quick-source of sugar with you in case you have symptoms of low blood sugar. Examples include hard sugar candy or glucose tablets. Make sure others know that you can choke if you eat or drink when you develop serious symptoms of low blood sugar, such as seizures or unconsciousness. They must get medical help at once. Tell your care team if you have high blood sugar. You might need to change the dose of your medication. If you are sick or exercising more than usual, you might need to change the dose of your medication. Do not skip meals. Ask your care team if you should avoid alcohol. Many nonprescription cough and cold products contain sugar or alcohol. These can affect blood sugar. Pens should never be shared. Even if the needle is changed, sharing may result in passing of viruses like hepatitis or HIV. Wear a medical ID bracelet or chain, and carry a card that describes your disease and details of your medication and dosage times. Birth control may not work properly while you are taking this medication. If you take birth control pills by mouth, your care team may recommend another type of birth control for 4 weeks after you start this medication and for 4 weeks after each increase in your dose of this medication. Ask your care team which birth  control methods you should use. What side effects may I notice from receiving this medication? Side effects that you should report to your care team as soon as possible: Allergic reactions--skin rash, itching, hives, swelling of the face, lips, tongue, or throat Change in vision Dehydration--increased thirst, dry mouth, feeling faint or lightheaded, headache, dark yellow or brown urine Gallbladder problems--severe stomach pain, nausea, vomiting, fever Kidney injury--decrease in the amount of urine, swelling of the ankles, hands, or feet Pancreatitis--severe stomach pain that spreads to your back or gets worse after eating or when touched, fever, nausea, vomiting Thyroid cancer--new mass or lump in the neck, pain or trouble swallowing, trouble breathing, hoarseness Side effects that usually do not require medical attention (report these to your care team if they continue or are bothersome): Constipation Diarrhea Loss of appetite Nausea Stomach pain Upset stomach Vomiting This list may not describe all possible side effects. Call your doctor for medical advice about side effects. You may report side effects to FDA at 1-800-FDA-1088. Where should I keep my medication? Keep out of the reach of children and pets. Refrigeration (preferred): Store unopened pens in a refrigerator between 2 and 8 degrees C (36 and 46 degrees F). Keep it in the original carton until you are ready to take it. Do not freeze or use if the medication has been frozen. Protect from light. Get   rid of any unused medication after the expiration date on the label. Room Temperature: The pen may be stored at room temperature below 30 degrees C (86 degrees F) for up to a total of 21 days if needed. Protect from light. Avoid exposure to extreme heat. If it is stored at room temperature, throw away any unused medication after 21 days or after it expires, whichever is first. The pen has glass parts. Handle it carefully. If you drop the  pen on a hard surface, do not use it. Use a new pen for your injection. To get rid of medications that are no longer needed or have expired: Take the medication to a medication take-back program. Check with your pharmacy or law enforcement to find a location. If you cannot return the medication, ask your pharmacist or care team how to get rid of this medication safely. NOTE: This sheet is a summary. It may not cover all possible information. If you have questions about this medicine, talk to your doctor, pharmacist, or health care provider.  2023 Elsevier/Gold Standard (2021-05-02 00:00:00)  

## 2021-09-13 LAB — HEMOGLOBIN A1C
Hgb A1c MFr Bld: 6.2 % of total Hgb — ABNORMAL HIGH (ref <5.7)
Mean Plasma Glucose: 131 mg/dL
eAG (mmol/L): 7.3 mmol/L

## 2021-09-13 LAB — COMPLETE METABOLIC PANEL WITH GFR
AG Ratio: 1.4 (calc) (ref 1.0–2.5)
ALT: 19 U/L (ref 9–46)
AST: 19 U/L (ref 10–35)
Albumin: 4.1 g/dL (ref 3.6–5.1)
Alkaline phosphatase (APISO): 46 U/L (ref 35–144)
BUN/Creatinine Ratio: 16 (calc) (ref 6–22)
BUN: 23 mg/dL (ref 7–25)
CO2: 28 mmol/L (ref 20–32)
Calcium: 9.4 mg/dL (ref 8.6–10.3)
Chloride: 107 mmol/L (ref 98–110)
Creat: 1.42 mg/dL — ABNORMAL HIGH (ref 0.70–1.35)
Globulin: 3 g/dL (calc) (ref 1.9–3.7)
Glucose, Bld: 152 mg/dL — ABNORMAL HIGH (ref 65–99)
Potassium: 4 mmol/L (ref 3.5–5.3)
Sodium: 142 mmol/L (ref 135–146)
Total Bilirubin: 0.9 mg/dL (ref 0.2–1.2)
Total Protein: 7.1 g/dL (ref 6.1–8.1)
eGFR: 55 mL/min/{1.73_m2} — ABNORMAL LOW (ref >=60)

## 2021-09-13 LAB — LIPID PANEL
Cholesterol: 138 mg/dL (ref <200)
HDL: 39 mg/dL — ABNORMAL LOW (ref >=40)
LDL Cholesterol (Calc): 79 mg/dL (calc)
Non-HDL Cholesterol (Calc): 99 mg/dL (calc) (ref <130)
Total CHOL/HDL Ratio: 3.5 (calc) (ref <5.0)
Triglycerides: 122 mg/dL (ref <150)

## 2021-09-13 LAB — CBC WITH DIFFERENTIAL/PLATELET
Absolute Monocytes: 465 cells/uL (ref 200–950)
Basophils Absolute: 31 cells/uL (ref 0–200)
Basophils Relative: 0.5 %
Eosinophils Absolute: 180 cells/uL (ref 15–500)
Eosinophils Relative: 2.9 %
HCT: 39.1 % (ref 38.5–50.0)
Hemoglobin: 12.8 g/dL — ABNORMAL LOW (ref 13.2–17.1)
Lymphs Abs: 2170 cells/uL (ref 850–3900)
MCH: 28.7 pg (ref 27.0–33.0)
MCHC: 32.7 g/dL (ref 32.0–36.0)
MCV: 87.7 fL (ref 80.0–100.0)
MPV: 10.3 fL (ref 7.5–12.5)
Monocytes Relative: 7.5 %
Neutro Abs: 3354 cells/uL (ref 1500–7800)
Neutrophils Relative %: 54.1 %
Platelets: 170 10*3/uL (ref 140–400)
RBC: 4.46 10*6/uL (ref 4.20–5.80)
RDW: 13.4 % (ref 11.0–15.0)
Total Lymphocyte: 35 %
WBC: 6.2 10*3/uL (ref 3.8–10.8)

## 2021-09-13 LAB — TSH: TSH: 2.35 mIU/L (ref 0.40–4.50)

## 2021-09-19 ENCOUNTER — Telehealth: Payer: Self-pay | Admitting: Nurse Practitioner

## 2021-09-19 NOTE — Telephone Encounter (Signed)
Wanting to know the status on prior authorization for weight loss injections

## 2021-09-19 NOTE — Telephone Encounter (Signed)
Patient was inquiring about the cost. Patient is to call the pharmacy.

## 2021-09-30 ENCOUNTER — Other Ambulatory Visit: Payer: Self-pay | Admitting: Adult Health

## 2021-09-30 DIAGNOSIS — R350 Frequency of micturition: Secondary | ICD-10-CM

## 2021-12-11 ENCOUNTER — Other Ambulatory Visit: Payer: Self-pay | Admitting: Nurse Practitioner

## 2021-12-11 ENCOUNTER — Telehealth: Payer: Self-pay | Admitting: Nurse Practitioner

## 2021-12-11 DIAGNOSIS — E1122 Type 2 diabetes mellitus with diabetic chronic kidney disease: Secondary | ICD-10-CM

## 2021-12-11 DIAGNOSIS — N1831 Chronic kidney disease, stage 3a: Secondary | ICD-10-CM

## 2021-12-11 MED ORDER — MOUNJARO 5 MG/0.5ML ~~LOC~~ SOAJ: 5.0000 mg | SUBCUTANEOUS | 2 refills | Status: DC

## 2021-12-11 NOTE — Telephone Encounter (Signed)
Done

## 2021-12-11 NOTE — Telephone Encounter (Signed)
Pt has medicare now and wanting a 3 mnth supply of Mounjaro to go to [facility]

## 2022-01-30 ENCOUNTER — Encounter: Payer: 59 | Admitting: Internal Medicine

## 2022-03-08 ENCOUNTER — Encounter: Payer: Self-pay | Admitting: Internal Medicine

## 2022-03-08 ENCOUNTER — Ambulatory Visit (INDEPENDENT_AMBULATORY_CARE_PROVIDER_SITE_OTHER): Payer: Medicare Other | Admitting: Internal Medicine

## 2022-03-08 VITALS — BP 130/88 | HR 84 | Temp 97.8°F | Resp 16 | Ht 68.5 in | Wt 235.4 lb

## 2022-03-08 DIAGNOSIS — I10 Essential (primary) hypertension: Secondary | ICD-10-CM

## 2022-03-08 DIAGNOSIS — Z136 Encounter for screening for cardiovascular disorders: Secondary | ICD-10-CM | POA: Diagnosis not present

## 2022-03-08 DIAGNOSIS — Z125 Encounter for screening for malignant neoplasm of prostate: Secondary | ICD-10-CM

## 2022-03-08 DIAGNOSIS — Z1211 Encounter for screening for malignant neoplasm of colon: Secondary | ICD-10-CM

## 2022-03-08 DIAGNOSIS — Z79899 Other long term (current) drug therapy: Secondary | ICD-10-CM

## 2022-03-08 DIAGNOSIS — Z0000 Encounter for general adult medical examination without abnormal findings: Secondary | ICD-10-CM

## 2022-03-08 DIAGNOSIS — E559 Vitamin D deficiency, unspecified: Secondary | ICD-10-CM

## 2022-03-08 DIAGNOSIS — E1169 Type 2 diabetes mellitus with other specified complication: Secondary | ICD-10-CM

## 2022-03-08 DIAGNOSIS — N1831 Chronic kidney disease, stage 3a: Secondary | ICD-10-CM

## 2022-03-08 DIAGNOSIS — Z8249 Family history of ischemic heart disease and other diseases of the circulatory system: Secondary | ICD-10-CM

## 2022-03-08 DIAGNOSIS — I700 Atherosclerosis of aorta: Secondary | ICD-10-CM

## 2022-03-08 DIAGNOSIS — E1122 Type 2 diabetes mellitus with diabetic chronic kidney disease: Secondary | ICD-10-CM | POA: Diagnosis not present

## 2022-03-08 DIAGNOSIS — Z0001 Encounter for general adult medical examination with abnormal findings: Secondary | ICD-10-CM

## 2022-03-08 DIAGNOSIS — N138 Other obstructive and reflux uropathy: Secondary | ICD-10-CM

## 2022-03-08 NOTE — Patient Instructions (Signed)

## 2022-03-08 NOTE — Progress Notes (Signed)
Annual  Screening/Preventative Visit  & Comprehensive Evaluation & Examination   Future Appointments  Date Time Provider Department  03/08/2022 11:00 AM Unk Pinto, MD [facility]  03/15/2023 11:00 AM Unk Pinto, MD [facility]              This very nice 66 y.o. MBM presents for a Screening /Preventative Visit & comprehensive evaluation and management of multiple medical co-morbidities.  Patient has been followed for HTN, HLD, Prediabetes and Vitamin D Deficiency.       HTN predates since  2005. Patient's BP has been controlled at home.  Today's BP is at goal - 130/88 . Patient denies any cardiac symptoms as chest pain, palpitations, shortness of breath, dizziness or ankle swelling.       Patient's hyperlipidemia is controlled with diet /Rosuvastatin. Patient denies myalgias or other medication SE's. Last lipids were at goal :  Lab Results  Component Value Date   CHOL 138 09/12/2021   HDL 39 (L) 09/12/2021   LDLCALC 79 09/12/2021   TRIG 122 09/12/2021   CHOLHDL 3.5 09/12/2021         Patient has Morbid Obesity (BMI 38.7) & consequent prediabetes (A1c 6.4% /2011) and in Feb 2012 had an A1c 6.5%  dx of T2_DM & he has CKD3a  (GFR 55).  He has been attempting dietary control. Patient denies reactive hypoglycemic symptoms, visual blurring, diabetic polys or paresthesias. Last A1c was not at goal :   Lab Results  Component Value Date   HGBA1C 6.2 (H) 09/12/2021         Finally, patient has history of Vitamin D Deficiency ("19" /2008) and last vitamin D was slightly elevated :   Lab Results  Component Value Date   VD25OH 97 01/30/2021        Current Outpatient Medications:     bisoprolol-hydrochlorothiazide (ZIAC) 10-6.25 MG tablet, Take  1 tablet  Daily     doxazosin (CARDURA) 8 MG tablet, TAKE 1 TABLET BY MOUTH AT  BEDTIME FOR BLOOD PRESSURE  AND PROSTATE, Disp: 90 tablet, Rfl: 3   Ferrous Sulfate (IRON PO), Take 65 mg by mouth daily., Disp: , Rfl:     meloxicam (MOBIC) 15 MG tablet, Take  1/2 to 1 tablet  Daily  with Food   oxybutynin (DITROPAN) 5 MG tablet, TAKE 1 TABLET BY MOUTH 3  TIMES DAILY FOR BLADDER  CONTROL, Disp: 270 tablet, Rfl: 3   rosuvastatin (CRESTOR) 40 MG tablet, Take  1 tablet  Daily  for Cholesterol                                                        /                 TAKE  BY MOUTH, Disp: 90 tablet, Rfl: 3   tadalafil (CIALIS) 20 MG tablet, Take 1/2 to 1 tablet every 2 to 3 days as needed for XXXX, Disp: 30 tablet, Rfl: 3   tirzepatide (MOUNJARO) 5 MG/0.5ML Pen, Inject 5 mg into the skin once a week., Disp: 6 mL, Rfl: 2   Vitamin D 50,000 u, TAKE 1 CAPSULE   WEEKLY, Disp: 13 capsule, Rfl: 3   zinc gluconate 50 MG tablet, Take 50 mg by mouth daily., Disp: , Rfl:     No Known  Allergies    Past Medical History:  Diagnosis Date   Anemia    ED (erectile dysfunction)    Hyperlipidemia    Hypertension    Hypogonadism male    Other abnormal glucose    Overactive bladder    Sleep apnea    mild, no cpap   Unspecified vitamin D deficiency      Health Maintenance  Topic Date Due   COVID-19 Vaccine (1) Never done   Pneumonia Vaccine 38+ Years old (1 - PCV) Never done   Zoster Vaccines- Shingrix (1 of 2) Never done   INFLUENZA VACCINE  Never done   TETANUS/TDAP  08/17/2025   COLONOSCOPY  02/22/2026   Hepatitis C Screening  Completed   HIV Screening  Completed   HPV VACCINES  Aged Out     Immunization History  Administered Date(s) Administered   PPD Test 11/19/2017, 12/24/2018, 01/25/2020   Td 04/02/2005   Tdap 08/18/2015    Last Colon - 02/23/2019- Dr Ardis Hughs - Recc 7 yr  f/u - due Nov /Dec 2027    Past Surgical History:  Procedure Laterality Date   COLONOSCOPY  2015   hx polyp   POLYPECTOMY     colon polyps     Family History  Problem Relation Age of Onset   Heart disease Mother    Hyperlipidemia Mother    Hypertension Mother    Cancer Father        lung, prostate   Colon cancer Neg Hx     Rectal cancer Neg Hx    Stomach cancer Neg Hx    Esophageal cancer Neg Hx      Social History   Tobacco Use   Smoking status: Never   Smokeless tobacco: Never  Vaping Use   Vaping Use: Never used  Substance Use Topics   Alcohol use: No   Drug use: No      ROS Constitutional: Denies fever, chills, weight loss/gain, headaches, insomnia,  night sweats or change in appetite. Does c/o fatigue. Eyes: Denies redness, blurred vision, diplopia, discharge, itchy or watery eyes.  ENT: Denies discharge, congestion, post nasal drip, epistaxis, sore throat, earache, hearing loss, dental pain, Tinnitus, Vertigo, Sinus pain or snoring.  Cardio: Denies chest pain, palpitations, irregular heartbeat, syncope, dyspnea, diaphoresis, orthopnea, PND, claudication or edema Respiratory: denies cough, dyspnea, DOE, pleurisy, hoarseness, laryngitis or wheezing.  Gastrointestinal: Denies dysphagia, heartburn, reflux, water brash, pain, cramps, nausea, vomiting, bloating, diarrhea, constipation, hematemesis, melena, hematochezia, jaundice or hemorrhoids Genitourinary: Denies dysuria, frequency, urgency, nocturia, hesitancy, discharge, hematuria or flank pain Musculoskeletal: Denies arthralgia, myalgia, stiffness, Jt. Swelling, pain, limp or strain/sprain. Denies Falls. Skin: Denies puritis, rash, hives, warts, acne, eczema or change in skin lesion Neuro: No weakness, tremor, incoordination, spasms, paresthesia or pain Psychiatric: Denies confusion, memory loss or sensory loss. Denies Depression. Endocrine: Denies change in weight, skin, hair change, nocturia, and paresthesia, diabetic polys, visual blurring or hyper / hypo glycemic episodes.  Heme/Lymph: No excessive bleeding, bruising or enlarged lymph nodes.   Physical Exam  BP 140/78   Pulse 63   Temp 97.8 F (36.6 C)   Resp 16   Ht 5' 8.5" (1.74 m)   Wt 258 lb 3.2 oz (117.1 kg)   SpO2 96%   BMI 38.7 kg/m   General Appearance:  Over  nourished and  and in no apparent distress.  Eyes: PERRLA, EOMs, conjunctiva no swelling or erythema, normal fundi and vessels. Sinuses: No frontal/maxillary tenderness ENT/Mouth: EACs patent / TMs  nl. Nares clear without erythema, swelling, mucoid exudates. Oral hygiene is good. No erythema, swelling, or exudate. Tongue normal, non-obstructing. Tonsils not swollen or erythematous. Hearing normal.  Neck: Supple, thyroid not palpable. No bruits, nodes or JVD. Respiratory: Respiratory effort normal.  BS equal and clear bilateral without rales, rhonci, wheezing or stridor. Cardio: Heart sounds are normal with regular rate and rhythm and no murmurs, rubs or gallops. Peripheral pulses are normal and equal bilaterally without edema. No aortic or femoral bruits. Chest: symmetric with normal excursions and percussion.  Abdomen: Soft, Rotund with Nl bowel sounds. Nontender, no guarding, rebound, hernias, masses, or organomegaly.  Lymphatics: Non tender without lymphadenopathy.  Musculoskeletal: Full ROM all peripheral extremities, joint stability, 5/5 strength, and normal gait. Skin: Warm and dry without rashes, lesions, cyanosis, clubbing or  ecchymosis.  Neuro: Cranial nerves intact, reflexes equal bilaterally. Normal muscle tone, no cerebellar symptoms. Sensation intact.  Pysch: Alert and oriented X 3 with normal affect, insight and judgment appropriate.   Assessment and Plan Annual Preventative Exam   2. Essential hypertension  - EKG 12-Lead - [place], RETROPERITNL ABD,  LTD - Microalbumin / creatinine urine ratio - CBC with Differential/Platelet - COMPLETE METABOLIC PANEL WITH GFR - Magnesium - TSH - Urinalysis, Routine w reflex microscopic  3. Hyperlipidemia associated with type 2 diabetes mellitus ([place])  - EKG 12-Lead - [place], RETROPERITNL ABD,  LTD - Lipid panel - TSH  4. Type 2 diabetes mellitus with stage 3a chronic kidney  disease, without long-term current use of insulin ([facility])  -  EKG 12-Lead - [place], RETROPERITNL ABD,  LTD - Microalbumin / creatinine urine ratio - COMPLETE METABOLIC PANEL WITH GFR - Hemoglobin A1c - Insulin, random - Urinalysis, Routine w reflex microscopic - tirzepatide (MOUNJARO) 2.5 MG/0.5ML Pen;  Inject 2.5 mg into the skin once a week.   Dispense: 2 mL; Refill: 0  5. Vitamin D deficiency  - VITAMIN D 25 Hydroxy   6. Impaired fasting glucose  - tirzepatide (MOUNJARO) 2.5 MG/0.5ML Pen;  Inject 2.5 mg into the skin once a week.   Dispense: 2 mL; Refill: 0  7. Class 2 severe obesity due to excess calories with  serious comorbidity in adult, BMI (39.7) (HCC)  - TSH  8. BPH with obstruction/lower urinary tract symptoms  - PSA  9. Screening for colorectal cancer  - POC Hemoccult Bld/Stl   10. Prostate cancer screening  - PSA  11. Screening for ischemic heart disease  - EKG 12-Lead  12. FH: heart disease  - EKG 12-Lead - [place], RETROPERITNL ABD,  LTD  13. Screening for AAA (aortic abdominal aneurysm)  - [place], RETROPERITNL ABD,  LTD  14. Medication management  - Microalbumin / creatinine urine ratio - CBC with Differential/Platelet - COMPLETE METABOLIC PANEL WITH GFR - Magnesium - Lipid panel - TSH - Hemoglobin A1c - Insulin, random - VITAMIN D 25 Hydroxy - Urinalysis, Routine w reflex microscopic          Patient was counseled in prudent diet, weight control to achieve/maintain BMI less than 25, BP monitoring, regular exercise and medications as discussed.  Discussed med effects and SE's. Routine screening labs and tests as requested with regular follow-up as recommended. Over 40 minutes of exam, counseling, chart review and high complex critical decision making was performed   Kirtland Bouchard, MD

## 2022-03-09 LAB — CBC WITH DIFFERENTIAL/PLATELET
Absolute Monocytes: 340 cells/uL (ref 200–950)
Basophils Absolute: 22 cells/uL (ref 0–200)
Basophils Relative: 0.4 %
Eosinophils Absolute: 119 cells/uL (ref 15–500)
Eosinophils Relative: 2.2 %
HCT: 39.7 % (ref 38.5–50.0)
Hemoglobin: 13.2 g/dL (ref 13.2–17.1)
Lymphs Abs: 2295 cells/uL (ref 850–3900)
MCH: 29.2 pg (ref 27.0–33.0)
MCHC: 33.2 g/dL (ref 32.0–36.0)
MCV: 87.8 fL (ref 80.0–100.0)
MPV: 10.6 fL (ref 7.5–12.5)
Monocytes Relative: 6.3 %
Neutro Abs: 2624 cells/uL (ref 1500–7800)
Neutrophils Relative %: 48.6 %
Platelets: 165 10*3/uL (ref 140–400)
RBC: 4.52 10*6/uL (ref 4.20–5.80)
RDW: 13.4 % (ref 11.0–15.0)
Total Lymphocyte: 42.5 %
WBC: 5.4 10*3/uL (ref 3.8–10.8)

## 2022-03-09 LAB — URINALYSIS, ROUTINE W REFLEX MICROSCOPIC
Bilirubin Urine: NEGATIVE
Glucose, UA: NEGATIVE
Hgb urine dipstick: NEGATIVE
Ketones, ur: NEGATIVE
Leukocytes,Ua: NEGATIVE
Nitrite: NEGATIVE
Protein, ur: NEGATIVE
Specific Gravity, Urine: 1.023 (ref 1.001–1.035)
pH: 5.5 (ref 5.0–8.0)

## 2022-03-09 LAB — VITAMIN D 25 HYDROXY (VIT D DEFICIENCY, FRACTURES): Vit D, 25-Hydroxy: 125 ng/mL — ABNORMAL HIGH (ref 30–100)

## 2022-03-09 LAB — LIPID PANEL
Cholesterol: 106 mg/dL (ref <200)
HDL: 40 mg/dL (ref >=40)
LDL Cholesterol (Calc): 51 mg/dL (calc)
Non-HDL Cholesterol (Calc): 66 mg/dL (calc) (ref <130)
Total CHOL/HDL Ratio: 2.7 (calc) (ref <5.0)
Triglycerides: 74 mg/dL (ref <150)

## 2022-03-09 LAB — MAGNESIUM: Magnesium: 2.2 mg/dL (ref 1.5–2.5)

## 2022-03-09 LAB — HEMOGLOBIN A1C
Hgb A1c MFr Bld: 5.8 % of total Hgb — ABNORMAL HIGH (ref <5.7)
Mean Plasma Glucose: 120 mg/dL
eAG (mmol/L): 6.6 mmol/L

## 2022-03-09 LAB — COMPLETE METABOLIC PANEL WITH GFR
AG Ratio: 1.5 (calc) (ref 1.0–2.5)
ALT: 16 U/L (ref 9–46)
AST: 16 U/L (ref 10–35)
Albumin: 4.3 g/dL (ref 3.6–5.1)
Alkaline phosphatase (APISO): 56 U/L (ref 35–144)
BUN: 21 mg/dL (ref 7–25)
CO2: 25 mmol/L (ref 20–32)
Calcium: 9.5 mg/dL (ref 8.6–10.3)
Chloride: 109 mmol/L (ref 98–110)
Creat: 1.26 mg/dL (ref 0.70–1.35)
Globulin: 2.9 g/dL (calc) (ref 1.9–3.7)
Glucose, Bld: 113 mg/dL — ABNORMAL HIGH (ref 65–99)
Potassium: 4 mmol/L (ref 3.5–5.3)
Sodium: 143 mmol/L (ref 135–146)
Total Bilirubin: 0.8 mg/dL (ref 0.2–1.2)
Total Protein: 7.2 g/dL (ref 6.1–8.1)
eGFR: 63 mL/min/{1.73_m2} (ref >=60)

## 2022-03-09 LAB — MICROALBUMIN / CREATININE URINE RATIO
Creatinine, Urine: 223 mg/dL (ref 20–320)
Microalb Creat Ratio: 12 mcg/mg creat (ref <30)
Microalb, Ur: 2.6 mg/dL

## 2022-03-09 LAB — INSULIN, RANDOM: Insulin: 103.2 u[IU]/mL — ABNORMAL HIGH

## 2022-03-09 LAB — TSH: TSH: 2.38 mIU/L (ref 0.40–4.50)

## 2022-03-09 LAB — PSA: PSA: 4.2 ng/mL — ABNORMAL HIGH (ref <=4.00)

## 2022-03-11 ENCOUNTER — Encounter: Payer: Self-pay | Admitting: Internal Medicine

## 2022-03-11 DIAGNOSIS — R972 Elevated prostate specific antigen [PSA]: Secondary | ICD-10-CM | POA: Insufficient documentation

## 2022-03-11 NOTE — Progress Notes (Signed)
<><><><><><><><><><><><><><><><><><><><><><><><><><><><><><><><><> <><><><><><><><><><><><><><><><><><><><><><><><><><><><><><><><><> -   Test results slightly outside the reference range are not unusual. If there is anything important, I will review this with you,  otherwise it is considered normal test values.  If you have further questions,  please do not hesitate to contact me at the office or via My Chart.  <><><><><><><><><><><><><><><><><><><><><><><><><><><><><><><><><> <><><><><><><><><><><><><><><><><><><><><><><><><><><><><><><><><>  -  PSA prostate test is slightly elevated - So will need to repeat in 6 months ( ~ June 2024)  <><><><><><><><><><><><><><><><><><><><><><><><><><><><><><><><><>  -  Also you need a Medicare wellness in 3 months with Hinton Dyer  &                                                                        a 6 month OV in June w / Melford Aase  <><><><><><><><><><><><><><><><><><><><><><><><><><><><><><><><><> <><><><><><><><><><><><><><><><><><><><><><><><><><><><><><><><><>  -  Insulin level = 103 is very high  ( normal is less than 20  ) & shows   shows insulin resistance   - which is  a sign of early diabetes and associated with   a 300 % greater risk for heart attacks, strokes, cancer &                                                                Alzheimer type vascular dementia   - All this can be cured  and prevented with losing weight   - get Dr Fara Olden Fuhrman's book 'the End of Diabetes" and "the End of Dieting"    - and add many years of good health to your life. <><><><><><><><><><><><><><><><><><><><><><><><><><><><><><><><><> <><><><><><><><><><><><><><><><><><><><><><><><><><><><><><><><><>  -  Vitamin D = 125 is too high  So Please STOP the 50,000 unit capsules  !   - After 1 week start back & ONLY take Vitamin D 5,000 units  capsule /day  <><><><><><><><><><><><><><><><><><><><><><><><><><><><><><><><><>  -  A1c = 5.8% is better - Down  from previous 6.2%  <><><><><><><><><><><><><><><><><><><><><><><><><><><><><><><><><>  -  Total Chol = 106   &  LDL Chol = 51   - Both  Excellent   - Very low risk for Heart Attack  / Stroke <><><><><><><><><><><><><><><><><><><><><><><><><><><><><><><><><>  -  All Else - CBC - Kidneys - Electrolytes - Liver - Magnesium & Thyroid    - all  Normal / OK ===========================================================

## 2022-04-17 ENCOUNTER — Other Ambulatory Visit: Payer: Self-pay

## 2022-04-17 DIAGNOSIS — Z1211 Encounter for screening for malignant neoplasm of colon: Secondary | ICD-10-CM

## 2022-04-17 LAB — POC HEMOCCULT BLD/STL (HOME/3-CARD/SCREEN)
Card #2 Fecal Occult Blod, POC: NEGATIVE
Card #3 Fecal Occult Blood, POC: NEGATIVE
Fecal Occult Blood, POC: NEGATIVE

## 2022-04-18 DIAGNOSIS — Z1212 Encounter for screening for malignant neoplasm of rectum: Secondary | ICD-10-CM | POA: Diagnosis not present

## 2022-04-18 DIAGNOSIS — Z1211 Encounter for screening for malignant neoplasm of colon: Secondary | ICD-10-CM | POA: Diagnosis not present

## 2022-05-15 ENCOUNTER — Other Ambulatory Visit: Payer: Self-pay | Admitting: Internal Medicine

## 2022-05-15 DIAGNOSIS — I10 Essential (primary) hypertension: Secondary | ICD-10-CM

## 2022-06-06 NOTE — Progress Notes (Addendum)
Medicare Annual Wellness Visit and 3 month Follow up   Encounter for medicare annual wellness visit Due yearly  Essential hypertension - continue medications, DASH diet, exercise and monitor at home. Call if greater than 130/80.  -     CBC with Differential/Platelet -     CMP -     TSH   Hyperlipidemia, mixed -continue medications, check lipids, decrease fatty foods, increase activity.  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  Perform daily foot/skin check, notify office of any concerning changes.  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     Continue diet and meds as discussed. Further disposition pending results of labs. Discussed med's effects and SE's.   Over 30 minutes of exam, counseling, chart review, and critical decision making was performed.  Future Appointments  Date Time Provider [place]  09/10/2022 11:00 AM Unk Pinto, MD [facility] None  03/15/2023 11:00 AM Unk Pinto, MD [facility] None   Plan:   During the course of the visit the patient was educated and counseled about appropriate screening and preventive  services including:   Pneumococcal vaccine  Prevnar 13 Influenza vaccine Td vaccine Screening electrocardiogram Bone densitometry screening Colorectal cancer screening Diabetes screening Glaucoma screening Nutrition counseling  Advanced directives: requested    This very nice 67 y.o.male presents for 3 month follow up with Hypertension, Hyperlipidemia, Pre-Diabetes and Vitamin D Deficiency.    Patient is treated for HTN & BP has been controlled at home. Currently on doxazosin 8 mg QD, Bisoprolol/HCTZ 10/6.25 mg qd. Today's BP: 128/78 BP Readings from Last 3 Encounters:  06/07/22 128/78  03/08/22 130/88  09/12/21 130/84  Patient has had no complaints of any cardiac type chest pain, palpitations, dyspnea / orthopnea / PND, dizziness, claudication, or dependent edema.  BMI is Body mass index is 38.9 kg/m., he has not been working on diet and exercise. Wt Readings from Last 3 Encounters:  06/07/22 259 lb 9.6 oz (117.8 kg)  03/08/22 235 lb 6.4 oz (106.8 kg)  09/12/21 257 lb 6.4 oz (116.8 kg)     Hyperlipidemia is controlled with diet & meds, Rosuvastatin 40 mg QD. Patient denies myalgias or other med SE's. Last Lipids were  Lab Results  Component Value Date   CHOL 106 03/08/2022   HDL 40 03/08/2022   LDLCALC 51 03/08/2022   TRIG 74 03/08/2022   CHOLHDL 2.7 03/08/2022   Also, the patient has abnormal glucose and has had no symptoms of reactive hypoglycemia, diabetic polys, paresthesias or visual blurring. Does  not ever remember taking Metformin.  He was prescribed Mounjaro but is not taking due to expense.  Last A1c was  Lab Results  Component Value Date   HGBA1C 5.8 (H) 03/08/2022   Lab Results  Component Value Date   EGFR 63 03/08/2022    Further, the patient also has history of Vitamin D Deficiency and supplements vitamin D without any suspected side-effects. Last vitamin D was   Lab Results  Component Value Date   VD25OH 125 (H) 03/08/2022      Current  Outpatient Medications on File Prior to Visit  Medication Sig   bisoprolol-hydrochlorothiazide (ZIAC) 10-6.25 MG tablet TAKE 1 TABLET BY MOUTH DAILY FOR BLOOD PRESSURE   doxazosin (CARDURA) 8 MG tablet TAKE 1 TABLET BY MOUTH AT  BEDTIME FOR BLOOD PRESSURE  AND PROSTATE   Ferrous Sulfate (IRON PO) Take 65 mg by mouth daily.   meloxicam (MOBIC) 15 MG tablet Take  1/2 to 1 tablet  Daily  with Food for Pain & Inflammation & try limit to 5 tablets /week to Avoid Kidney Damage   oxybutynin (DITROPAN) 5 MG tablet TAKE 1 TABLET BY MOUTH 3  TIMES DAILY FOR BLADDER  CONTROL   rosuvastatin (CRESTOR) 40 MG tablet Take  1 tablet  Daily  for Cholesterol                                                        /                 TAKE  BY MOUTH   tadalafil (CIALIS) 20 MG tablet Take 1/2 to 1 tablet every 2 to 3 days as needed for XXXX   Vitamin D, Ergocalciferol, (DRISDOL) 1.25 MG (50000 UNIT) CAPS capsule TAKE 1 CAPSULE BY MOUTH  WEEKLY   zinc gluconate 50 MG tablet Take 50 mg by mouth daily.   No current facility-administered medications on file prior to visit.   No Known Allergies PMHx:   Past Medical History:  Diagnosis Date   Anemia    ED (erectile dysfunction)    Hyperlipidemia    Hypertension    Hypogonadism male    Other abnormal glucose    Overactive bladder    Sleep apnea    mild, no cpap   Unspecified vitamin D deficiency    Immunization History  Administered Date(s) Administered   PPD Test 05/06/2013, 05/07/2014, 08/18/2015, 10/04/2016, 11/19/2017, 12/24/2018, 01/25/2020   Pfizer Covid-19 Vaccine Bivalent Booster 42yr & up 02/25/2021   Td 04/02/2005   Tdap 08/18/2015   Health Maintenance  Topic Date Due   OPHTHALMOLOGY EXAM  06/07/2022 (Originally 11/07/1965)   COVID-19 Vaccine (2 - Pfizer risk series) 06/23/2022 (Originally 03/18/2021)   INFLUENZA VACCINE  07/01/2022 (Originally 10/31/2021)   Zoster Vaccines- Shingrix (1 of 2) 09/07/2022 (Originally 11/08/1974)   Pneumonia Vaccine 67  Years old (1 of 1 - PCV) 06/07/2023 (Originally 11/07/2020)   HEMOGLOBIN A1C  09/07/2022   Diabetic kidney evaluation - eGFR measurement  03/09/2023   Diabetic kidney evaluation - Urine ACR  03/09/2023   FOOT EXAM  03/09/2023   Medicare Annual Wellness (AWV)  06/07/2023   DTaP/Tdap/Td (3 - Td or Tdap) 08/17/2025   COLONOSCOPY (Pts 45-43yrInsurance coverage will need to be confirmed)  02/22/2026   Hepatitis C Screening  Completed   HPV  VACCINES  Aged Out    Past Surgical History:  Procedure Laterality Date   COLONOSCOPY  2015   hx polyp   POLYPECTOMY     colon polyps   FHx:    Reviewed / unchanged  SHx:    Reviewed / unchanged   MEDICARE WELLNESS OBJECTIVES: Physical activity: Current Exercise Habits: Home exercise routine, Type of exercise: walking, Time (Minutes): 10, Frequency (Times/Week): 5, Weekly Exercise (Minutes/Week): 50, Intensity: Mild, Exercise limited by: orthopedic condition(s) Cardiac risk factors: Cardiac Risk Factors include: advanced age (>79mn, >>16women);diabetes mellitus;dyslipidemia;hypertension;male gender;obesity (BMI >30kg/m2) Depression/mood screen:      06/07/2022   11:25 AM  Depression screen PHQ 2/9  Decreased Interest 0  Down, Depressed, Hopeless 0  PHQ - 2 Score 0    ADLs:     06/07/2022   11:25 AM 03/08/2022    4:26 PM  In your present state of health, do you have any difficulty performing the following activities:  Hearing? 0 0  Vision? 0 0  Difficulty concentrating or making decisions? 0 0  Walking or climbing stairs? 1 0  Dressing or bathing? 0 0  Doing errands, shopping? 0 0     Cognitive Testing  Alert? Yes  Normal Appearance?Yes  Oriented to person? Yes  Place? Yes   Time? Yes  Recall of three objects?  Yes  Can perform simple calculations? Yes  Displays appropriate judgment?Yes  Can read the correct time from a watch face?Yes  EOL planning: Does Patient Have a Medical Advance Directive?: Yes Type of Advance Directive:  Living will     Physical Exam  BP 128/78   Pulse 65   Temp 97.7 F (36.5 C)   Ht 5' 8.5" (1.74 m)   Wt 259 lb 9.6 oz (117.8 kg)   SpO2 96%   BMI 38.90 kg/m   Pleasant obese, well groomed  and in no distress.  Eyes: PERRLA, EOMs, conjunctiva no swelling or erythema. Sinuses: No frontal/maxillary tenderness ENT/Mouth: EAC's clear, TM's nl w/o erythema, bulging. Nares clear w/o erythema, swelling, exudates. Oropharynx clear without erythema or exudates. Oral hygiene is good. Tongue normal, non obstructing. Hearing intact.  Neck: Supple. Thyroid not palpable. Car 2+/2+ without bruits, nodes or JVD. Chest: Respirations nl with BS clear & equal w/o rales, rhonchi, wheezing or stridor.  Cor: Heart sounds normal w/ regular rate and rhythm without sig. murmurs, gallops, clicks or rubs. Peripheral pulses normal and equal  without edema.  Abdomen: Soft & bowel sounds normal. Non-tender w/o guarding, rebound, hernias, masses or organomegaly.  Lymphatics: Unremarkable.  Musculoskeletal: Full ROM all peripheral extremities, joint stability, 5/5 strength and normal gait.  Skin: Warm, dry without exposed rashes, lesions or ecchymosis apparent.  Neuro: Cranial nerves intact, reflexes equal bilaterally. Sensory-motor testing grossly intact. Tendon reflexes grossly intact.  Pysch: Alert & oriented x 3.  Insight and judgement nl & appropriate. No ideations.    Medicare Attestation I have personally reviewed: The patient's medical and social history Their use of alcohol, tobacco or illicit drugs Their current medications and supplements The patient's functional ability including ADLs,fall risks, home safety risks, cognitive, and hearing and visual impairment Diet and physical activities Evidence for depression or mood disorders  The patient's weight, height, BMI, and visual acuity have been recorded in the chart.  I have made referrals, counseling, and provided education to the patient based on  review of the above and I have provided the patient with a written personalized care plan for preventive services.

## 2022-06-07 ENCOUNTER — Ambulatory Visit (INDEPENDENT_AMBULATORY_CARE_PROVIDER_SITE_OTHER): Payer: Medicare Other | Admitting: Nurse Practitioner

## 2022-06-07 ENCOUNTER — Encounter: Payer: Self-pay | Admitting: Nurse Practitioner

## 2022-06-07 ENCOUNTER — Other Ambulatory Visit: Payer: Self-pay | Admitting: Nurse Practitioner

## 2022-06-07 VITALS — BP 128/78 | HR 65 | Temp 97.7°F | Ht 68.5 in | Wt 259.6 lb

## 2022-06-07 DIAGNOSIS — Z0001 Encounter for general adult medical examination with abnormal findings: Secondary | ICD-10-CM | POA: Diagnosis not present

## 2022-06-07 DIAGNOSIS — E559 Vitamin D deficiency, unspecified: Secondary | ICD-10-CM | POA: Diagnosis not present

## 2022-06-07 DIAGNOSIS — Z79899 Other long term (current) drug therapy: Secondary | ICD-10-CM

## 2022-06-07 DIAGNOSIS — N182 Chronic kidney disease, stage 2 (mild): Secondary | ICD-10-CM

## 2022-06-07 DIAGNOSIS — E669 Obesity, unspecified: Secondary | ICD-10-CM

## 2022-06-07 DIAGNOSIS — E1169 Type 2 diabetes mellitus with other specified complication: Secondary | ICD-10-CM

## 2022-06-07 DIAGNOSIS — R6889 Other general symptoms and signs: Secondary | ICD-10-CM | POA: Diagnosis not present

## 2022-06-07 DIAGNOSIS — E785 Hyperlipidemia, unspecified: Secondary | ICD-10-CM | POA: Diagnosis not present

## 2022-06-07 DIAGNOSIS — I10 Essential (primary) hypertension: Secondary | ICD-10-CM

## 2022-06-07 DIAGNOSIS — N138 Other obstructive and reflux uropathy: Secondary | ICD-10-CM | POA: Diagnosis not present

## 2022-06-07 DIAGNOSIS — E1122 Type 2 diabetes mellitus with diabetic chronic kidney disease: Secondary | ICD-10-CM

## 2022-06-07 DIAGNOSIS — Z0000 Encounter for general adult medical examination without abnormal findings: Secondary | ICD-10-CM

## 2022-06-07 DIAGNOSIS — N401 Enlarged prostate with lower urinary tract symptoms: Secondary | ICD-10-CM

## 2022-06-07 MED ORDER — METFORMIN HCL 500 MG PO TABS: ORAL_TABLET | ORAL | 1 refills | Status: DC

## 2022-06-07 NOTE — Patient Instructions (Signed)

## 2022-06-08 LAB — CBC WITH DIFFERENTIAL/PLATELET
Absolute Monocytes: 410 cells/uL (ref 200–950)
Basophils Absolute: 20 cells/uL (ref 0–200)
Basophils Relative: 0.4 %
Eosinophils Absolute: 140 cells/uL (ref 15–500)
Eosinophils Relative: 2.8 %
HCT: 40.2 % (ref 38.5–50.0)
Hemoglobin: 13.2 g/dL (ref 13.2–17.1)
Lymphs Abs: 2070 cells/uL (ref 850–3900)
MCH: 28.8 pg (ref 27.0–33.0)
MCHC: 32.8 g/dL (ref 32.0–36.0)
MCV: 87.6 fL (ref 80.0–100.0)
MPV: 10.8 fL (ref 7.5–12.5)
Monocytes Relative: 8.2 %
Neutro Abs: 2360 cells/uL (ref 1500–7800)
Neutrophils Relative %: 47.2 %
Platelets: 170 10*3/uL (ref 140–400)
RBC: 4.59 10*6/uL (ref 4.20–5.80)
RDW: 13.1 % (ref 11.0–15.0)
Total Lymphocyte: 41.4 %
WBC: 5 10*3/uL (ref 3.8–10.8)

## 2022-06-08 LAB — LIPID PANEL
Cholesterol: 119 mg/dL (ref <200)
HDL: 40 mg/dL (ref >=40)
LDL Cholesterol (Calc): 52 mg/dL (calc)
Non-HDL Cholesterol (Calc): 79 mg/dL (calc) (ref <130)
Total CHOL/HDL Ratio: 3 (calc) (ref <5.0)
Triglycerides: 196 mg/dL — ABNORMAL HIGH (ref <150)

## 2022-06-08 LAB — COMPLETE METABOLIC PANEL WITH GFR
AG Ratio: 1.4 (calc) (ref 1.0–2.5)
ALT: 16 U/L (ref 9–46)
AST: 17 U/L (ref 10–35)
Albumin: 4.1 g/dL (ref 3.6–5.1)
Alkaline phosphatase (APISO): 54 U/L (ref 35–144)
BUN: 23 mg/dL (ref 7–25)
CO2: 27 mmol/L (ref 20–32)
Calcium: 9.2 mg/dL (ref 8.6–10.3)
Chloride: 107 mmol/L (ref 98–110)
Creat: 1.27 mg/dL (ref 0.70–1.35)
Globulin: 2.9 g/dL (calc) (ref 1.9–3.7)
Glucose, Bld: 132 mg/dL — ABNORMAL HIGH (ref 65–99)
Potassium: 4.1 mmol/L (ref 3.5–5.3)
Sodium: 142 mmol/L (ref 135–146)
Total Bilirubin: 0.8 mg/dL (ref 0.2–1.2)
Total Protein: 7 g/dL (ref 6.1–8.1)
eGFR: 62 mL/min/{1.73_m2} (ref >=60)

## 2022-06-08 LAB — HEMOGLOBIN A1C
Hgb A1c MFr Bld: 6.3 % of total Hgb — ABNORMAL HIGH (ref <5.7)
Mean Plasma Glucose: 134 mg/dL
eAG (mmol/L): 7.4 mmol/L

## 2022-06-08 LAB — VITAMIN D 25 HYDROXY (VIT D DEFICIENCY, FRACTURES): Vit D, 25-Hydroxy: 78 ng/mL (ref 30–100)

## 2022-06-20 DIAGNOSIS — H2513 Age-related nuclear cataract, bilateral: Secondary | ICD-10-CM | POA: Diagnosis not present

## 2022-06-20 DIAGNOSIS — H40033 Anatomical narrow angle, bilateral: Secondary | ICD-10-CM | POA: Diagnosis not present

## 2022-07-22 ENCOUNTER — Other Ambulatory Visit: Payer: Self-pay | Admitting: Internal Medicine

## 2022-07-22 DIAGNOSIS — E782 Mixed hyperlipidemia: Secondary | ICD-10-CM

## 2022-08-09 ENCOUNTER — Encounter: Payer: Self-pay | Admitting: Nurse Practitioner

## 2022-08-09 ENCOUNTER — Ambulatory Visit (INDEPENDENT_AMBULATORY_CARE_PROVIDER_SITE_OTHER): Payer: Medicare Other | Admitting: Nurse Practitioner

## 2022-08-09 VITALS — BP 110/68 | HR 70 | Temp 97.7°F | Ht 68.5 in | Wt 251.2 lb

## 2022-08-09 DIAGNOSIS — G8929 Other chronic pain: Secondary | ICD-10-CM | POA: Diagnosis not present

## 2022-08-09 DIAGNOSIS — M25561 Pain in right knee: Secondary | ICD-10-CM | POA: Diagnosis not present

## 2022-08-09 DIAGNOSIS — M25562 Pain in left knee: Secondary | ICD-10-CM | POA: Diagnosis not present

## 2022-08-09 DIAGNOSIS — M159 Polyosteoarthritis, unspecified: Secondary | ICD-10-CM

## 2022-08-09 NOTE — Progress Notes (Signed)
Assessment and Plan:  Gabriel Livingston was seen today for an episodic visit.  Diagnoses and all order for this visit:  1. Chronic pain of both knees RICE when flared Brace support PRN Continue Meloxicam as directed as needed  - Ambulatory referral to Orthopedic Surgery  2. Osteoarthritis of multiple joints, unspecified osteoarthritis type  - Ambulatory referral to Orthopedic Surgery  Notify office for further evaluation and treatment, questions or concerns if s/s fail to improve. The risks and benefits of my recommendations, as well as other treatment options were discussed with the patient today. Questions were answered.  Further disposition pending results of labs. Discussed med's effects and SE's.    Over 15 minutes of exam, counseling, chart review, and critical decision making was performed.   Future Appointments  Date Time Provider Department [facility]  09/10/2022 11:00 AM Lucky Cowboy, MD [facility] None  03/15/2023 11:00 AM Lucky Cowboy, MD [facility] None  06/11/2023 11:00 AM Raynelle Dick, NP [facility] None    ------------------------------------------------------------------------------------------------------------------   HPI BP 110/68   Pulse 70   Temp 97.7 F (36.5 C)   Ht 5' 8.5" (1.74 m)   Wt 251 lb 3.2 oz (113.9 kg)   SpO2 98%   BMI 37.64 kg/m   67 y.o.male presents for evaluation of bilateral knee pain.  He is established with [facility], Dr. Althea Charon.  Has had steroid injections in the past without effectiveness.  States that Dr. Althea Charon does not perform surgery and is looking to have a consultation regarding chronic knee pain.  Reports having x-rays completed and on file with [facility].  He takes Meloxicam for pain.  Pain is most noticed when standing for long periods of time, walking long distances.    Past Medical History:  Diagnosis Date   Anemia    ED (erectile dysfunction)    Hyperlipidemia    Hypertension     Hypogonadism male    Other abnormal glucose    Overactive bladder    Sleep apnea    mild, no cpap   Unspecified vitamin D deficiency      No Known Allergies  Current Outpatient Medications on File Prior to Visit  Medication Sig   bisoprolol-hydrochlorothiazide (ZIAC) 10-6.25 MG tablet TAKE 1 TABLET BY MOUTH DAILY FOR BLOOD PRESSURE   doxazosin (CARDURA) 8 MG tablet TAKE 1 TABLET BY MOUTH AT  BEDTIME FOR BLOOD PRESSURE  AND PROSTATE   Ferrous Sulfate (IRON PO) Take 65 mg by mouth daily.   meloxicam (MOBIC) 15 MG tablet Take  1/2 to 1 tablet  Daily  with Food for Pain & Inflammation & try limit to 5 tablets /week to Avoid Kidney Damage   metFORMIN (GLUCOPHAGE) 500 MG tablet TAKE 1 TABLET BY MOUTH DAILY WITH THE LARGEST MEAL (Patient taking differently: TAKE 1 TABLET BY MOUTH DAILY WITH THE LARGEST MEAL FOR BLOOD SUGAR)   oxybutynin (DITROPAN) 5 MG tablet Take  1 tablet 3 x /day for Bladder Control                                                                                    /  TAKE                                      BY                                            MOUTH (Patient taking differently: Take  1 tablet 3 x /day for Bladder Control)   rosuvastatin (CRESTOR) 40 MG tablet Take  1 tablet  Daily   for Cholesterol                                                                         /                                                   TAKE                                                    BY                                                   MOUTH (Patient taking differently: Take  1 tablet  Daily   for Cholesterol)   tadalafil (CIALIS) 20 MG tablet Take 1/2 to 1 tablet every 2 to 3 days as needed for XXXX   Vitamin D, Ergocalciferol, (DRISDOL) 1.25 MG (50000 UNIT) CAPS capsule TAKE 1 CAPSULE BY MOUTH  WEEKLY   zinc gluconate 50 MG tablet Take 50 mg by mouth daily.   No current facility-administered medications on file prior to  visit.    ROS: all negative except what is noted in the HPI.   Physical Exam:  BP 110/68   Pulse 70   Temp 97.7 F (36.5 C)   Ht 5' 8.5" (1.74 m)   Wt 251 lb 3.2 oz (113.9 kg)   SpO2 98%   BMI 37.64 kg/m   General Appearance: NAD.  Awake, conversant and cooperative. Eyes: PERRLA, EOMs intact.  Sclera white.  Conjunctiva without erythema. Sinuses: No frontal/maxillary tenderness.  No nasal discharge. Nares patent.  ENT/Mouth: Ext aud canals clear.  Bilateral TMs w/DOL and without erythema or bulging. Hearing intact.  Posterior pharynx without swelling or exudate.  Tonsils without swelling or erythema.  Neck: Supple.  No masses, nodules or thyromegaly. Respiratory: Effort is regular with non-labored breathing. Breath sounds are equal bilaterally without rales, rhonchi, wheezing or stridor.  Cardio: RRR with no MRGs. Brisk peripheral pulses without edema.  Abdomen: Active BS in all four quadrants.  Soft and non-tender without guarding, rebound tenderness, hernias or masses. Lymphatics: Non tender without lymphadenopathy.  Musculoskeletal: LROM,  d/t pain. Pain with flexion and extension. Crepitus.  5/5 strength, normal ambulation.  No clubbing or cyanosis. Skin: Appropriate color for ethnicity. Warm without rashes, lesions, ecchymosis, ulcers.  Neuro: CN II-XII grossly normal. Normal muscle tone without cerebellar symptoms and intact sensation.   Psych: AO X 3,  appropriate mood and affect, insight and judgment.     Adela Glimpse, NP 11:49 AM [facility]

## 2022-08-09 NOTE — Patient Instructions (Signed)
Acute Knee Pain, Adult Many things can cause knee pain. Sometimes, knee pain is sudden (acute) and may be caused by damage, swelling, or irritation of the muscles and tissues that support your knee. The pain often goes away on its own with time and rest. If the pain does not go away, tests may be done to find out what is causing the pain. Follow these instructions at home: If you have a knee sleeve or brace:  Wear the knee sleeve or brace as told by your doctor. Take it off only as told by your doctor. Loosen it if your toes: Tingle. Become numb. Turn cold and blue. Keep it clean. If the knee sleeve or brace is not waterproof: Do not let it get wet. Cover it with a watertight covering when you take a bath or shower. Activity Rest your knee. Do not do things that cause pain or make pain worse. Avoid activities where both feet leave the ground at the same time (high-impact activities). Examples are running, jumping rope, and doing jumping jacks. Work with a physical therapist to make a safe exercise program, as told by your doctor. Managing pain, stiffness, and swelling  If told, put ice on the knee. To do this: If you have a removable knee sleeve or brace, take it off as told by your doctor. Put ice in a plastic bag. Place a towel between your skin and the bag. Leave the ice on for 20 minutes, 2-3 times a day. Take off the ice if your skin turns bright red. This is very important. If you cannot feel pain, heat, or cold, you have a greater risk of damage to the area. If told, use an elastic bandage to put pressure (compression) on your injured knee. Raise your knee above the level of your heart while you are sitting or lying down. Sleep with a pillow under your knee. General instructions Take over-the-counter and prescription medicines only as told by your doctor. Do not smoke or use any products that contain nicotine or tobacco. If you need help quitting, ask your doctor. If you are  overweight, work with your doctor and a food expert (dietitian) to set goals to lose weight. Being overweight can make your knee hurt more. Watch for any changes in your symptoms. Keep all follow-up visits. Contact a doctor if: The knee pain does not stop. The knee pain changes or gets worse. You have a fever along with knee pain. Your knee is red or feels warm when you touch it. Your knee gives out or locks up. Get help right away if: Your knee swells, and the swelling gets worse. You cannot move your knee. You have very bad knee pain that does not get better with pain medicine. Summary Many things can cause knee pain. The pain often goes away on its own with time and rest. Your doctor may do tests to find out the cause of the pain. Watch for any changes in your symptoms. Relieve your pain with rest, medicines, light activity, and use of ice. Get help right away if you cannot move your knee or your knee pain is very bad. This information is not intended to replace advice given to you by your health care provider. Make sure you discuss any questions you have with your health care provider. Document Revised: 09/02/2019 Document Reviewed: 09/02/2019 [employer] Patient Education  2023 [employer]  

## 2022-08-16 DIAGNOSIS — M170 Bilateral primary osteoarthritis of knee: Secondary | ICD-10-CM | POA: Diagnosis not present

## 2022-09-10 ENCOUNTER — Ambulatory Visit: Payer: Medicare Other | Admitting: Internal Medicine

## 2022-09-13 ENCOUNTER — Ambulatory Visit: Payer: Medicare Other | Admitting: Internal Medicine

## 2022-10-16 ENCOUNTER — Encounter: Payer: Self-pay | Admitting: Internal Medicine

## 2022-10-16 ENCOUNTER — Ambulatory Visit (INDEPENDENT_AMBULATORY_CARE_PROVIDER_SITE_OTHER): Payer: Medicare Other | Admitting: Internal Medicine

## 2022-10-16 VITALS — BP 128/80 | HR 64 | Temp 97.9°F | Resp 17 | Ht 68.5 in | Wt 249.6 lb

## 2022-10-16 DIAGNOSIS — Z79899 Other long term (current) drug therapy: Secondary | ICD-10-CM | POA: Diagnosis not present

## 2022-10-16 DIAGNOSIS — E559 Vitamin D deficiency, unspecified: Secondary | ICD-10-CM

## 2022-10-16 DIAGNOSIS — E782 Mixed hyperlipidemia: Secondary | ICD-10-CM | POA: Diagnosis not present

## 2022-10-16 DIAGNOSIS — R7309 Other abnormal glucose: Secondary | ICD-10-CM

## 2022-10-16 DIAGNOSIS — I10 Essential (primary) hypertension: Secondary | ICD-10-CM

## 2022-10-16 DIAGNOSIS — R972 Elevated prostate specific antigen [PSA]: Secondary | ICD-10-CM

## 2022-10-16 MED ORDER — PHENTERMINE HCL 37.5 MG PO TABS: ORAL_TABLET | ORAL | 1 refills | Status: DC

## 2022-10-16 MED ORDER — METFORMIN HCL 500 MG PO TABS: ORAL_TABLET | ORAL | 3 refills | Status: DC

## 2022-10-16 NOTE — Progress Notes (Signed)
Future Appointments  Date Time Provider Department  10/16/2022                 6 mo   4:00 PM Lucky Cowboy, MD [facility]  03/15/2023                cpe 11:00 AM Lucky Cowboy, MD [facility]  06/11/2023                 wellness  11:00 AM Raynelle Dick, NP [facility]    History of Present Illness:       This very nice 67 y.o. MBM presents for 6 month follow up with HTN, HLD, Pre-Diabetes and Vitamin D Deficiency.  In Dec 2023 , patient had a rise in PSA for 3.19 to 4.40 .        Patient is treated for HTN & BP has been controlled at home. Today's BP is at goal - 128/80.   Patient has had no complaints of any cardiac type chest pain, palpitations, dyspnea / orthopnea / PND, dizziness, claudication, or dependent edema.        Hyperlipidemia is controlled with diet & meds. Patient denies myalgias or other med SE's. Last Lipids were at goal except elevated Trig's :  Lab Results  Component Value Date   CHOL 119 06/07/2022   HDL 40 06/07/2022   LDLCALC 52 06/07/2022   TRIG 196 (H) 06/07/2022   CHOLHDL 3.0 06/07/2022     Also, the patient has history of PreDiabetes and has had no symptoms of reactive hypoglycemia, diabetic polys, paresthesias or visual blurring.  Last A1c was  not at goal :  Lab Results  Component Value Date   HGBA1C 6.3 (H) 06/07/2022        Further, the patient also has history of Vitamin D Deficiency and supplements vitamin D without any suspected side-effects. Last vitamin D was  Lab Results  Component Value Date   VD25OH 78 06/07/2022     Current Outpatient Medications  Medication Instructions   bisoprolol-hctz  10-6.25 MG tablet TAKE 1 TABLET  DAILY   doxazosin  8 MG tablet TAKE 1 TABLET AT  BEDTIME   Ferrous Sulfate (IRON)   65 mg   Daily   meloxicam (MOBIC) 15 MG tablet Take  1/2 to 1 tablet  Daily  with Food   metFORMIN 500 MG tablet TAKE 1 TABLET DAILY    oxybutynin  5 MG tablet Take_1_tablet_3_x/day                                                        rosuvastatin  40 MG tablet Take  1 tablet  Daily    tadalafil (CIALIS) 20 MG tablet Take 1/2 to 1 tablet every 2 to 3 days as needed    Vitamin D 50,000 u TAKE 1 CAPSULE WEEKLY   Zinc   50 mg  Oral, Daily     No Known Allergies   PMHx:   Past Medical History:  Diagnosis Date   Anemia    ED (erectile dysfunction)    Hyperlipidemia    Hypertension    Hypogonadism male    Other abnormal glucose    Overactive bladder    Sleep apnea    mild, no cpap   vitamin D deficiency  Immunization History  Administered Date(s) Administered   PPD Test 11/19/2017, 12/24/2018, 01/25/2020   Td 04/02/2005   Tdap 08/18/2015    Past Surgical History:  Procedure Laterality Date   COLONOSCOPY  2015   hx polyp   POLYPECTOMY     colon polyps    FHx:    Reviewed / unchanged  SHx:    Reviewed / unchanged   Systems Review:  Constitutional: Denies fever, chills, wt changes, headaches, insomnia, fatigue, night sweats, change in appetite. Eyes: Denies redness, blurred vision, diplopia, discharge, itchy, watery eyes.  ENT: Denies discharge, congestion, post nasal drip, epistaxis, sore throat, earache, hearing loss, dental pain, tinnitus, vertigo, sinus pain, snoring.  CV: Denies chest pain, palpitations, irregular heartbeat, syncope, dyspnea, diaphoresis, orthopnea, PND, claudication or edema. Respiratory: denies cough, dyspnea, DOE, pleurisy, hoarseness, laryngitis, wheezing.  Gastrointestinal: Denies dysphagia, odynophagia, heartburn, reflux, water brash, abdominal pain or cramps, nausea, vomiting, bloating, diarrhea, constipation, hematemesis, melena, hematochezia  or hemorrhoids. Genitourinary: Denies dysuria, frequency, urgency, nocturia, hesitancy, discharge, hematuria or flank pain. Musculoskeletal: Denies arthralgias, myalgias, stiffness, jt. swelling, pain, limping or strain/sprain.  Skin: Denies pruritus, rash, hives, warts, acne, eczema or change in skin  lesion(s). Neuro: No weakness, tremor, incoordination, spasms, paresthesia or pain. Psychiatric: Denies confusion, memory loss or sensory loss. Endo: Denies change in weight, skin or hair change.  Heme/Lymph: No excessive bleeding, bruising or enlarged lymph nodes.  Physical Exam  BP 128/80   Pulse 64   Temp 97.9 F (36.6 C)   Resp 17   Ht 5' 8.5" (1.74 m)   Wt 249 lb 9.6 oz (113.2 kg)   SpO2 96%   BMI 37.40 kg/m   Appears  well nourished, well groomed  and in no distress.  Eyes: PERRLA, EOMs, conjunctiva no swelling or erythema. Sinuses: No frontal/maxillary tenderness ENT/Mouth: EAC's clear, TM's nl w/o erythema, bulging. Nares clear w/o erythema, swelling, exudates. Oropharynx clear without erythema or exudates. Oral hygiene is good. Tongue normal, non obstructing. Hearing intact.  Neck: Supple. Thyroid not palpable. Car 2+/2+ without bruits, nodes or JVD. Chest: Respirations nl with BS clear & equal w/o rales, rhonchi, wheezing or stridor.  Cor: Heart sounds normal w/ regular rate and rhythm without sig. murmurs, gallops, clicks or rubs. Peripheral pulses normal and equal  without edema.  Abdomen: Soft & bowel sounds normal. Non-tender w/o guarding, rebound, hernias, masses or organomegaly.  Lymphatics: Unremarkable.  Musculoskeletal: Full ROM all peripheral extremities, joint stability, 5/5 strength and normal gait.  Skin: Warm, dry without exposed rashes, lesions or ecchymosis apparent.  Neuro: Cranial nerves intact, reflexes equal bilaterally. Sensory-motor testing grossly intact. Tendon reflexes grossly intact.  Pysch: Alert & oriented x 3.  Insight and judgement nl & appropriate. No ideations.  Assessment and Plan:  1. Essential hypertension   - Continue medication, monitor blood pressure at home.  - Continue DASH diet.  Reminder to go to the ER if any CP,  SOB, nausea, dizziness, severe HA, changes vision/speech.   - CBC with Differential/Platelet - COMPLETE  METABOLIC PANEL WITH GFR - Magnesium - TSH   2. Hyperlipidemia, mixed  - Continue diet/meds, exercise,& lifestyle modifications.  - Continue monitor periodic cholesterol/liver & renal functions    - Lipid panel - TSH   3. Abnormal glucose  - Continue diet, exercise  - Lifestyle modifications.  - Monitor appropriate labs   - Hemoglobin A1C w/out eAG - Insulin, random   4. Vitamin D deficiency   - Continue supplementation   - VITAMIN  D 25 Hydroxy   5. Elevated PSA = 4.21 Mar 2022  - PSA, total and free   6. Medication management  - CBC with Differential/Platelet - COMPLETE METABOLIC PANEL WITH GFR - Magnesium - Lipid panel - TSH - Hemoglobin A1C w/out eAG - Insulin, random - VITAMIN D 25 Hydroxy  - PSA, total and free       Discussed  regular exercise, BP monitoring, weight control to achieve/maintain BMI less than 25 and discussed med and SE's. Recommended labs to assess and monitor clinical status with further disposition pending results of labs.  I discussed the assessment and treatment plan with the patient. The patient was provided an opportunity to ask questions and all were answered. The patient agreed with the plan and demonstrated an understanding of the instructions.  I provided over 30 minutes of exam, counseling, chart review and  complex critical decision making.      The patient was advised to call back or seek an in-person evaluation if the symptoms worsen or if the condition fails to improve as anticipated.   Marinus Maw, MD

## 2022-10-16 NOTE — Patient Instructions (Addendum)
Due to recent changes in healthcare laws, you may see the results of your imaging and laboratory studies on MyChart before your provider has had a chance to review them.  We understand that in some cases there may be results that are confusing or concerning to you. Not all laboratory results come back in the same time frame and the provider may be waiting for multiple results in order to interpret others.  Please give [place] 48 hours in order for your provider to thoroughly review all the results before contacting the office for clarification of your results.  ++++++++++++++++++++++++++  Vit D  & Vit C 1,000 mg   are recommended to help protect  against the Covid-19 and other Corona viruses.    Also it's recommended  to take  Zinc 50 mg  to help  protect against the Covid-19   and best place to get  is also on [email or web address]  and don't pay more than 6-8 cents /pill !   +++++++++++++++++++++++++++++++++++++++ Recommend Adult Low Dose Aspirin or  coated  Aspirin 81 mg daily  To reduce risk of Colon Cancer 40 %,  Skin Cancer 26 % ,  Melanoma 46%  and  Pancreatic cancer 60% +++++++++++++++++++++++++++++++++++++++++ Vitamin D goal  is between 70-100.  Please make sure that you are taking your Vitamin D as directed.  It is very important as a natural anti-inflammatory  helping hair, skin, and nails, as well as reducing stroke and heart attack risk.  It helps your bones and helps with mood. It also decreases numerous cancer risks so please take it as directed.  Low Vit D is associated with a 200-300% higher risk for CANCER  and 200-300% higher risk for HEART   ATTACK  &  STROKE.   .....................................Marland Kitchen It is also associated with higher death rate at younger ages,  autoimmune diseases like Rheumatoid arthritis, Lupus, Multiple Sclerosis.    Also many other serious conditions, like depression, Alzheimer's Dementia, infertility, muscle aches, fatigue, fibromyalgia - just to name  a few. +++++++++++++++++++++++++++++++++++++++++ Recommend the book "The END of DIETING" by Dr Monico Hoar  & the book "The END of DIABETES " by Dr Monico Hoar At [email or web address] - get book & Audio CD's    Being diabetic has a  300% increased risk for heart attack, stroke, cancer, and alzheimer- type vascular dementia. It is very important that you work harder with diet by avoiding all foods that are white. Avoid white rice (brown & wild rice is OK), white potatoes (sweetpotatoes in moderation is OK), White bread or wheat bread or anything made out of white flour like bagels, donuts, rolls, buns, biscuits, cakes, pastries, cookies, pizza crust, and pasta (made from white flour & egg whites) - vegetarian pasta or spinach or wheat pasta is OK. Multigrain breads like Arnold's or [employer], or multigrain sandwich thins or flatbreads.  Diet, exercise and weight loss can reverse and cure diabetes in the early stages.  Diet, exercise and weight loss is very important in the control and prevention of complications of diabetes which affects every system in your body, ie. Brain - dementia/stroke, eyes - glaucoma/blindness, heart - heart attack/heart failure, kidneys - dialysis, stomach - gastric paralysis, intestines - malabsorption, nerves - severe painful neuritis, circulation - gangrene & loss of a leg(s), and finally cancer and Alzheimers.    I recommend avoid fried & greasy foods,  sweets/candy, white rice (brown or wild rice or Quinoa is OK), white potatoes (sweet potatoes are OK) - anything  made from white flour - bagels, doughnuts, rolls, buns, biscuits,white and wheat breads, pizza crust and traditional pasta made of white flour & egg white(vegetarian pasta or spinach or wheat pasta is OK).  Multi-grain bread is OK - like multi-grain flat bread or sandwich thins. Avoid alcohol in excess. Exercise is also important.    Eat all the vegetables you want - avoid meat, especially red meat and dairy - especially  cheese.  Cheese is the most concentrated form of trans-fats which is the worst thing to clog up our arteries. Veggie cheese is OK which can be found in the fresh produce section at [employer] or [employer] or [employer]  +++++++++++++++++++++++++++++++++++++++ DASH Eating Plan  DASH stands for "Dietary Approaches to Stop Hypertension."   The DASH eating plan is a healthy eating plan that has been shown to reduce high blood pressure (hypertension). Additional health benefits may include reducing the risk of type 2 diabetes mellitus, heart disease, and stroke. The DASH eating plan may also help with weight loss. WHAT DO I NEED TO KNOW ABOUT THE DASH EATING PLAN? For the DASH eating plan, you will follow these general guidelines: Choose foods with a percent daily value for sodium of less than 5% (as listed on the food label). Use salt-free seasonings or herbs instead of table salt or sea salt. Check with your health care provider or pharmacist before using salt substitutes. Eat lower-sodium products, often labeled as "lower sodium" or "no salt added." Eat fresh foods. Eat more vegetables, fruits, and low-fat dairy products. Choose whole grains. Look for the word "whole" as the first word in the ingredient list. Choose fish  Limit sweets, desserts, sugars, and sugary drinks. Choose heart-healthy fats. Eat veggie cheese  Eat more home-cooked food and less restaurant, buffet, and fast food. Limit fried foods. Cook foods using methods other than frying. Limit canned vegetables. If you do use them, rinse them well to decrease the sodium. When eating at a restaurant, ask that your food be prepared with less salt, or no salt if possible.                      WHAT FOODS CAN I EAT? Read Dr Francis Dowse Fuhrman's books on The End of Dieting & The End of Diabetes  Grains Whole grain or whole wheat bread. Brown rice. Whole grain or whole wheat pasta. Quinoa, bulgur, and whole grain cereals. Low-sodium  cereals. Corn or whole wheat flour tortillas. Whole grain cornbread. Whole grain crackers. Low-sodium crackers.  Vegetables Fresh or frozen vegetables (raw, steamed, roasted, or grilled). Low-sodium or reduced-sodium tomato and vegetable juices. Low-sodium or reduced-sodium tomato sauce and paste. Low-sodium or reduced-sodium canned vegetables.   Fruits All fresh, canned (in natural juice), or frozen fruits.  Protein Products  All fish and seafood.  Dried beans, peas, or lentils. Unsalted nuts and seeds. Unsalted canned beans.  Dairy Low-fat dairy products, such as skim or 1% milk, 2% or reduced-fat cheeses, low-fat ricotta or cottage cheese, or plain low-fat yogurt. Low-sodium or reduced-sodium cheeses.  Fats and Oils Tub margarines without trans fats. Light or reduced-fat mayonnaise and salad dressings (reduced sodium). Avocado. Safflower, olive, or canola oils. Natural peanut or almond butter.  Other Unsalted popcorn and pretzels. The items listed above may not be a complete list of recommended foods or beverages. Contact your dietitian for more options.  +++++++++++++++  WHAT FOODS ARE NOT RECOMMENDED? Grains/ White flour or wheat flour White bread. White pasta. White rice. Refined  cornbread. Bagels and croissants. Crackers that contain trans fat.  Vegetables  Creamed or fried vegetables. Vegetables in a . Regular canned vegetables. Regular canned tomato sauce and paste. Regular tomato and vegetable juices.  Fruits Dried fruits. Canned fruit in light or heavy syrup. Fruit juice.  Meat and Other Protein Products Meat in general - RED meat & White meat.  Fatty cuts of meat. Ribs, chicken wings, all processed meats as bacon, sausage, bologna, salami, fatback, hot dogs, bratwurst and packaged luncheon meats.  Dairy Whole or 2% milk, cream, half-and-half, and cream cheese. Whole-fat or sweetened yogurt. Full-fat cheeses or blue cheese. Non-dairy creamers and whipped toppings.  Processed cheese, cheese spreads, or cheese curds.  Condiments Onion and garlic salt, seasoned salt, table salt, and sea salt. Canned and packaged gravies. Worcestershire sauce. Tartar sauce. Barbecue sauce. Teriyaki sauce. Soy sauce, including reduced sodium. Steak sauce. Fish sauce. Oyster sauce. Cocktail sauce. Horseradish. Ketchup and mustard. Meat flavorings and tenderizers. Bouillon cubes. Hot sauce. Tabasco sauce. Marinades. Taco seasonings. Relishes.  Fats and Oils Butter, stick margarine, lard, shortening and bacon fat. Coconut, palm kernel, or palm oils. Regular salad dressings.  Pickles and olives. Salted popcorn and pretzels.  The items listed above may not be a complete list of foods and beverages to avoid.  ================================   Prostate Cancer Screening  Prostate cancer screening is testing that is done to check for the presence of prostate cancer in men. The prostate gland is a walnut-sized gland that is located below the bladder and in front of the rectum in males. The function of the prostate is to add fluid to semen during ejaculation. Prostate cancer is one of the most common types of cancer in men. Who should have prostate cancer screening? Screening recommendations vary based on age and other risk factors, as well as between the professional organizations who make the recommendations. In general, screening is recommended if: You are age 44 to 55 and have an average risk for prostate cancer. You should talk with your health care provider about your need for screening and how often screening should be done. Because most prostate cancers are slow growing and will not cause death, screening in this age group is generally reserved for men who have a 10- to 15-year life expectancy. You are younger than age 36, and you have these risk factors: Having a father, brother, or uncle who has been diagnosed with prostate cancer. The risk is higher if your family member's  cancer occurred at an early age or if you have multiple family members with prostate cancer at an early age. Being a male who is [place] or is of [place] or sub-Saharan African descent. In general, screening is not recommended if: You are younger than age 68. You are between the ages of 62 and 1 and you have no risk factors. You are 62 years of age or older. At this age, the risks that screening can cause are greater than the benefits that it may provide. If you are at high risk for prostate cancer, your health care provider may recommend that you have screenings more often or that you start screening at a younger age. How is screening for prostate cancer done?  The recommended prostate cancer screening test is a blood test called the prostate-specific antigen (PSA) test.     PSA is a protein that is made in the prostate. As you age, your prostate naturally produces more PSA.    Abnormally high PSA levels may be caused  by:  Prostate cancer.  An enlarged prostate that is not caused by cancer (benign prostatic hyperplasia, or BPH).                                                                                                               This condition is very common in older men.  A prostate gland infection (prostatitis) or urinary tract infection.  Certain medicines such as male hormones (like testosterone) o or other medicines that raise testosterone levels.   Depending on the PSA results, you may need more tests, such as:  A physical exam to check the size of your prostate gland, if not done as part of screening.  Blood and imaging tests.  A procedure to remove tissue samples from your prostate gland for testing (biopsy). This is the only way to know for certain if you have prostate cancer. What are the benefits of prostate cancer screening?  Screening can help to identify cancer at an early stage, before symptoms start and when the cancer can be treated more easily.  There is  a small chance that screening may lower your risk of dying from prostate cancer. The chance is small because prostate cancer is a slow-growing cancer, and most men with prostate cancer die from a different cause.  What are the risks of prostate cancer screening?  The main risk of prostate cancer screening is diagnosing and treating prostate cancer that would never have caused any symptoms or problems. Th is is called overdiagnosis  and overtreatment.   PSA screening cannot tell you if your PSA is high due to cancer or a different cause. A   prostate biopsy is the only procedure to diagnose prostate cancer.   Even the results of a biopsy may not tell you if your cancer needs to be treated.   Slow-growing prostate cancer may not need any treatment other than monitoring, S o diagnosing and treating it may cause unnecessary stress or other side effects. Prostate cancer is a common type of cancer in men. The prostate gland is located below the bladder and in front of the rectum. This gland adds fluid to semen during ejaculation.  Prostate cancer screening may identify cancer at an early stage, when the cancer can be treated more easily and is less likely to have spread to other areas of the body.  The prostate-specific antigen (PSA) test is the recommended screening test for prostate cancer, but it has associated risks.  Discuss the risks and benefits of prostate cancer screening with your health care provider.

## 2022-10-17 ENCOUNTER — Other Ambulatory Visit: Payer: Self-pay | Admitting: Internal Medicine

## 2022-10-17 NOTE — Progress Notes (Signed)
^<^<^<^<^<^<^<^<^<^<^<^<^<^<^<^<^<^<^<^<^<^<^<^<^<^<^<^<^<^<^<^<^<^<^<^<^ ^>^>^>^>^>^>^>^>^>^>^>>^>^>^>^>^>^>^>^>^>^>^>^>^>^>^>^>^>^>^>^>^>^>^>^>^>  -Test results slightly outside the reference range are not unusual. If there is anything important, I will review this with you,  otherwise it is considered normal test values.  If you have further questions,  please do not hesitate to contact me at the office or via My Chart.   ^<^<^<^<^<^<^<^<^<^<^<^<^<^<^<^<^<^<^<^<^<^<^<^<^<^<^<^<^<^<^<^<^<^<^<^<^ ^>^>^>^>^>^>^>^>^>^>^>^>^>^>^>^>^>^>^>^>^>^>^>^>^>^>^>^>^>^>^>^>^>^>^>^>^  - A1c = 6.2% is slightly  elevated    12 week average  Blood Sugar, So . . . . Marland Kitchen    - Avoid Sweets, Candy & White Stuff   - White Rice, [place], [place] Flour  - Breads &  Pasta  ^>^>^>^>^>^>^>^>^>^>^>^>^>^>^>^>^>^>^>^>^>^>^>^>^>^>^>^>^>^>^>^>^>^>^>^>^  - Insulin = 46.6 is still elevated & shows shows insulin resistance   - a sign of early diabetes and  associated with a 300 % greater risk for   heart attacks, strokes, cancer & Alzheimer type vascular dementia   - All this can be cured  and prevented with losing weight   - get Dr Francis Dowse Fuhrman's book 'the End of Diabetes" and   "the End of Dieting"   ^>^>^>^>^>^>^>^>^>^>^>^>^>^>^>^>^>^>^>^>^>^>^>^>^>^>^>^>^>^>^>^>^>^>^>^>^  -   Vitamin D = 114 is a little elevated , \  So Recommend that you STOP the Vit D 50,000 unit capsules  !                                                                                       Leave off Vitamin D for 1 week   Then purchase Vitamin D 5,000 unit capsules & ONLY take 1 capsule  / day   ^>^>^>^>^>^>^>^>^>^>^>^>^>^>^>^>^>^>^>^>^>^>^>^>^>^>^>^>^>^>^>^>^>^>^>^>^  - - Vitamin D goal is between 70-100.   - Please make sure that you are taking your Vitamin D as directed.   - It is very important as a natural anti-inflammatory and helping the                           immune system protect against viral infections, like  the Covid-19    helping hair, skin, and nails, as well as reducing stroke and heart attack risk.   - It helps your bones and helps with mood.  - It also decreases numerous cancer risks so please                                                                                           take it as directed.   - Low Vit D is associated with a 200-300% higher risk for CANCER   and 200-300% higher risk for HEART   ATTACK  &  STROKE.    - It is also associated with higher death rate at younger ages,   autoimmune diseases like Rheumatoid arthritis, Lupus, Multiple Sclerosis.     - Also many other serious conditions, like depression,  Alzheimer's  Dementia,  muscle aches, fatigue, fibromyalgia   ^>^>^>^>^>^>^>^>^>^>^>^>^>^>^>^>^>^>^>^>^>^>^>^>^>^>^>^>^>^>^>^>^>^>^>^>^>^ ^>^>^>^>^>^>^>^>^>^>^>^>^>^>^>^>^>^>^>^>^>^>^>^>^>^>^>^>^>^>^>^>^>^>^>^  -  Kidney Functions Stable in Stage 3a   ^>^>^>^>^>^>^>^>^>^>^>^>^>^>^>^>^>^>^>^>^>^>^>^>^>^>^>^>^>^>^>^>^>^>^>^>^  - Chol = 128   is Wonderful  /   Excellent   - Very low risk for Heart Attack  / Stroke  ^>^>^>^>^>^>^>^>^>^>^>^>^>^>^>^>^>^>^>^>^>^>^>^>^>^>^>^>^>^>^>^>^>^>^>^>^ ^>^>^>^>^>^>^>^>^>^>^>^>^>^>^>^>^>^>^>^>^>^>^>^>^>^>^>^>^>^>^>^>^>^>^>^>^  -  All Else - CBC - Kidneys - Electrolytes - Liver - Magnesium & Thyroid    - all  Normal / OK ^>^>^>^>^>^>^>^>^>^>^>^>^>^>^>^>^>^>^>^>^>^>^>^>^>^>^>^>^>^>^>^>^>^>^>^>^ ^>^>^>^>^>^>^>^>^>^>^>^>^>^>^>^>^>^>^>^>^>^>^>^>^>^>^>^>^>^>^>^>^>^>^>^>^  -  Keep up the Great Work  !   ^>^>^>^>^>^>^>^>^>^>^>^>^>^>^>^>^>^>^>^>^>^>^>^>^>^>^>^>^>^>^>^>^>^>^>^>^ ^>^>^>^>^>^>^>^>^>^>^>^>^>^>^>^>^>^>^>^>^>^>^>^>^>^>^>^>^>^>^>^>^>^>^>^>^

## 2022-10-18 ENCOUNTER — Other Ambulatory Visit: Payer: Self-pay | Admitting: Internal Medicine

## 2022-10-18 DIAGNOSIS — R972 Elevated prostate specific antigen [PSA]: Secondary | ICD-10-CM

## 2022-10-18 LAB — CBC WITH DIFFERENTIAL/PLATELET
Absolute Monocytes: 382 cells/uL (ref 200–950)
Basophils Absolute: 29 cells/uL (ref 0–200)
Basophils Relative: 0.5 %
Eosinophils Absolute: 194 cells/uL (ref 15–500)
Eosinophils Relative: 3.4 %
HCT: 39 % (ref 38.5–50.0)
Hemoglobin: 12.9 g/dL — ABNORMAL LOW (ref 13.2–17.1)
Lymphs Abs: 2502 cells/uL (ref 850–3900)
MCH: 29.2 pg (ref 27.0–33.0)
MCHC: 33.1 g/dL (ref 32.0–36.0)
MCV: 88.2 fL (ref 80.0–100.0)
MPV: 10.2 fL (ref 7.5–12.5)
Monocytes Relative: 6.7 %
Neutro Abs: 2594 cells/uL (ref 1500–7800)
Neutrophils Relative %: 45.5 %
Platelets: 154 10*3/uL (ref 140–400)
RBC: 4.42 10*6/uL (ref 4.20–5.80)
RDW: 13.1 % (ref 11.0–15.0)
Total Lymphocyte: 43.9 %
WBC: 5.7 10*3/uL (ref 3.8–10.8)

## 2022-10-18 LAB — PSA, TOTAL AND FREE
PSA, % Free: 39 % (calc) (ref >25)
PSA, Free: 1.9 ng/mL
PSA, Total: 4.9 ng/mL — ABNORMAL HIGH (ref <=4.0)

## 2022-10-18 LAB — MAGNESIUM: Magnesium: 2.2 mg/dL (ref 1.5–2.5)

## 2022-10-18 LAB — TSH: TSH: 2.15 mIU/L (ref 0.40–4.50)

## 2022-10-18 LAB — COMPLETE METABOLIC PANEL WITH GFR
AG Ratio: 1.5 (calc) (ref 1.0–2.5)
ALT: 16 U/L (ref 9–46)
AST: 18 U/L (ref 10–35)
Albumin: 4.3 g/dL (ref 3.6–5.1)
Alkaline phosphatase (APISO): 41 U/L (ref 35–144)
BUN/Creatinine Ratio: 19 (calc) (ref 6–22)
BUN: 26 mg/dL — ABNORMAL HIGH (ref 7–25)
CO2: 32 mmol/L (ref 20–32)
Calcium: 9.8 mg/dL (ref 8.6–10.3)
Chloride: 107 mmol/L (ref 98–110)
Creat: 1.4 mg/dL — ABNORMAL HIGH (ref 0.70–1.35)
Globulin: 2.9 g/dL (calc) (ref 1.9–3.7)
Glucose, Bld: 100 mg/dL — ABNORMAL HIGH (ref 65–99)
Potassium: 4 mmol/L (ref 3.5–5.3)
Sodium: 143 mmol/L (ref 135–146)
Total Bilirubin: 1 mg/dL (ref 0.2–1.2)
Total Protein: 7.2 g/dL (ref 6.1–8.1)
eGFR: 55 mL/min/{1.73_m2} — ABNORMAL LOW (ref >=60)

## 2022-10-18 LAB — INSULIN, RANDOM: Insulin: 46.6 u[IU]/mL — ABNORMAL HIGH

## 2022-10-18 LAB — LIPID PANEL
Cholesterol: 128 mg/dL (ref <200)
HDL: 38 mg/dL — ABNORMAL LOW (ref >=40)
LDL Cholesterol (Calc): 69 mg/dL (calc)
Non-HDL Cholesterol (Calc): 90 mg/dL (calc) (ref <130)
Total CHOL/HDL Ratio: 3.4 (calc) (ref <5.0)
Triglycerides: 129 mg/dL (ref <150)

## 2022-10-18 LAB — VITAMIN D 25 HYDROXY (VIT D DEFICIENCY, FRACTURES): Vit D, 25-Hydroxy: 114 ng/mL — ABNORMAL HIGH (ref 30–100)

## 2022-10-18 LAB — HEMOGLOBIN A1C W/OUT EAG: Hgb A1c MFr Bld: 6.2 % of total Hgb — ABNORMAL HIGH (ref <5.7)

## 2022-10-18 NOTE — Progress Notes (Signed)
^<^<^<^<^<^<^<^<^<^<^<^<^<^<^<^<^<^<^<^<^<^<^<^<^<^<^<^<^<^<^<^<^<^<^<^<^ ^>^>^>^>^>^>^>^>^>^>^>>^>^>^>^>^>^>^>^>^>^>^>^>^>^>^>^>^>^>^>^>^>^>^>^>^>  -   PSA  was slightly elevated at 4.20 in Dec 2023   And   Repeat PSA now  = 4.9  still elevated  ( normal is less than 4.0)  So, have requested Urology Referral / Consultation   ^<^<^<^<^<^<^<^<^<^<^<^<^<^<^<^<^<^<^<^<^<^<^<^<^<^<^<^<^<^<^<^<^<^<^<^<^ ^>^>^>^>^>^>^>^>^>^>^>^>^>^>^>^>^>^>^>^>^>^>^>^>^>^>^>^>^>^>^>^>^>^>^>^>^  -

## 2022-11-01 DIAGNOSIS — M1711 Unilateral primary osteoarthritis, right knee: Secondary | ICD-10-CM | POA: Diagnosis not present

## 2022-11-06 ENCOUNTER — Telehealth: Payer: Self-pay | Admitting: Internal Medicine

## 2022-11-06 NOTE — Telephone Encounter (Signed)
Patient called due to [facility] referral questions. Patient did schedule appt for 12-18-22. Patient did not get lab results from last office visit; could not access My Chart and wishes to close MyChart today. Gave lab results and referral information. Patient asks what is PSA, please call patient to discuss.

## 2022-11-13 DIAGNOSIS — R3912 Poor urinary stream: Secondary | ICD-10-CM | POA: Diagnosis not present

## 2022-11-13 DIAGNOSIS — E349 Endocrine disorder, unspecified: Secondary | ICD-10-CM | POA: Diagnosis not present

## 2022-11-15 ENCOUNTER — Other Ambulatory Visit: Payer: Self-pay | Admitting: Urology

## 2022-11-15 DIAGNOSIS — R972 Elevated prostate specific antigen [PSA]: Secondary | ICD-10-CM

## 2022-11-15 DIAGNOSIS — Z8042 Family history of malignant neoplasm of prostate: Secondary | ICD-10-CM

## 2022-12-10 ENCOUNTER — Ambulatory Visit
Admission: RE | Admit: 2022-12-10 | Discharge: 2022-12-10 | Disposition: A | Discharge status: Home / Self Care | Payer: Medicare Other | Source: Ambulatory Visit | Attending: Urology | Admitting: Urology

## 2022-12-10 DIAGNOSIS — Z8042 Family history of malignant neoplasm of prostate: Secondary | ICD-10-CM

## 2022-12-10 DIAGNOSIS — R972 Elevated prostate specific antigen [PSA]: Secondary | ICD-10-CM

## 2022-12-10 MED ORDER — GADOPICLENOL 0.5 MMOL/ML IV SOLN
10.0000 mL | Freq: Once | INTRAVENOUS | Status: AC | PRN
  Administered 2022-12-10: 10 mL via INTRAVENOUS

## 2022-12-27 ENCOUNTER — Other Ambulatory Visit: Payer: Self-pay | Admitting: Internal Medicine

## 2022-12-27 DIAGNOSIS — R338 Other retention of urine: Secondary | ICD-10-CM | POA: Diagnosis not present

## 2022-12-27 DIAGNOSIS — R350 Frequency of micturition: Secondary | ICD-10-CM

## 2022-12-27 MED ORDER — DOXAZOSIN MESYLATE 8 MG PO TABS
ORAL_TABLET | ORAL | 3 refills | Status: DC
Start: 2022-12-27 — End: 2023-08-14

## 2023-01-04 DIAGNOSIS — R338 Other retention of urine: Secondary | ICD-10-CM | POA: Diagnosis not present

## 2023-01-07 ENCOUNTER — Encounter: Payer: Self-pay | Admitting: Internal Medicine

## 2023-01-07 DIAGNOSIS — R262 Difficulty in walking, not elsewhere classified: Secondary | ICD-10-CM | POA: Diagnosis not present

## 2023-01-07 DIAGNOSIS — M1731 Unilateral post-traumatic osteoarthritis, right knee: Secondary | ICD-10-CM | POA: Diagnosis not present

## 2023-01-07 DIAGNOSIS — M25661 Stiffness of right knee, not elsewhere classified: Secondary | ICD-10-CM | POA: Diagnosis not present

## 2023-01-09 DIAGNOSIS — M25561 Pain in right knee: Secondary | ICD-10-CM | POA: Diagnosis not present

## 2023-01-09 DIAGNOSIS — M1712 Unilateral primary osteoarthritis, left knee: Secondary | ICD-10-CM | POA: Diagnosis not present

## 2023-01-09 DIAGNOSIS — M1711 Unilateral primary osteoarthritis, right knee: Secondary | ICD-10-CM | POA: Diagnosis not present

## 2023-01-12 ENCOUNTER — Emergency Department (HOSPITAL_COMMUNITY)
Admission: EM | Admit: 2023-01-12 | Discharge: 2023-01-12 | Disposition: A | Discharge status: Home / Self Care | Payer: Medicare Other | Attending: Emergency Medicine | Admitting: Emergency Medicine

## 2023-01-12 ENCOUNTER — Encounter (HOSPITAL_COMMUNITY): Payer: Self-pay

## 2023-01-12 ENCOUNTER — Other Ambulatory Visit: Payer: Self-pay

## 2023-01-12 DIAGNOSIS — K5900 Constipation, unspecified: Secondary | ICD-10-CM | POA: Diagnosis not present

## 2023-01-12 DIAGNOSIS — R339 Retention of urine, unspecified: Secondary | ICD-10-CM | POA: Diagnosis not present

## 2023-01-12 HISTORY — DX: Benign prostatic hyperplasia without lower urinary tract symptoms: N40.0

## 2023-01-12 LAB — URINALYSIS, ROUTINE W REFLEX MICROSCOPIC
Bacteria, UA: NONE SEEN
Bilirubin Urine: NEGATIVE
Glucose, UA: NEGATIVE mg/dL
Ketones, ur: NEGATIVE mg/dL
Nitrite: NEGATIVE
Protein, ur: 30 mg/dL — AB
Specific Gravity, Urine: 1.015 (ref 1.005–1.030)
WBC, UA: >50 WBC/hpf (ref 0–5)
pH: 5 (ref 5.0–8.0)

## 2023-01-12 MED ORDER — LIDOCAINE HCL URETHRAL/MUCOSAL 2 % EX GEL
1.0000 | Freq: Once | CUTANEOUS | Status: AC
  Administered 2023-01-12: 1 via TOPICAL
  Filled 2023-01-12: qty 11

## 2023-01-12 NOTE — Discharge Instructions (Signed)
Be sure to follow-up with the sure to follow-up with your urology team tomorrow.  Return here for concerning changes in your condition.

## 2023-01-12 NOTE — ED Provider Notes (Signed)
[place] EMERGENCY DEPARTMENT AT [facility] Provider Note   CSN: [id number] Arrival date & time: 01/12/23  1307     History  Chief Complaint  Patient presents with   Urinary Retention   Constipation    Gabriel Livingston is a 67 y.o. male.  HPI Patient presents with inability to urinate.  History is notable for prostate biopsy 2 weeks ago.  The day following that he had an episode of urinary retention requiring catheter placement.  That catheter was removed 8 days ago.  He notes that since that time is doing generally well until a few hours ago when he went unable to urinate.  Some suprapubic discomfort otherwise no abdominal pain, chest pain, fever, vomiting.  He is here with his wife who assists with the history.    Home Medications Prior to Admission medications   Medication Sig Start Date End Date Taking? Authorizing Provider  bisoprolol-hydrochlorothiazide [facility]) 10-6.25 MG tablet TAKE 1 TABLET BY MOUTH DAILY FOR BLOOD PRESSURE 05/15/22   Raynelle Dick, NP  doxazosin (CARDURA) 8 MG tablet Take  1 tablet  at Bedtime  for BP & Prostate                                                              /                                                                   TAKE                                         BY                                                 MOUTH 12/27/22   Lucky Cowboy, MD  Ferrous Sulfate (IRON PO) Take 65 mg by mouth daily.    [provider]  meloxicam (MOBIC) 15 MG tablet Take  1/2 to 1 tablet  Daily  with Food for Pain & Inflammation & try limit to 5 tablets /week to Avoid Kidney Damage 09/11/20   Lucky Cowboy, MD  metFORMIN (GLUCOPHAGE) 500 MG tablet Take   1 tablet  2 x /day with Meals 10/16/22   Lucky Cowboy, MD  oxybutynin (DITROPAN) 5 MG tablet Take  1 tablet 3 x /day for Bladder Control                                                                                    /  TAKE                                      BY                                            MOUTH Patient taking differently: Take  1 tablet 3 x /day for Bladder Control 07/22/22   Lucky Cowboy, MD  phentermine (ADIPEX-P) 37.5 MG tablet Take 1/2 to 1 tablet every Morning for Dieting & Weight Loss   (Dx: e66.01) 10/16/22   Lucky Cowboy, MD  rosuvastatin (CRESTOR) 40 MG tablet Take  1 tablet  Daily   for Cholesterol                                                                         /                                                   TAKE                                                    BY                                                   MOUTH Patient taking differently: Take  1 tablet  Daily   for Cholesterol 07/22/22   Lucky Cowboy, MD  tadalafil (CIALIS) 20 MG tablet Take 1/2 to 1 tablet every 2 to 3 days as needed for XXXX 04/24/19   Lucky Cowboy, MD  zinc gluconate 50 MG tablet Take 50 mg by mouth daily.    [provider]      Allergies    Patient has no known allergies.    Review of Systems   Review of Systems  Physical Exam Updated Vital Signs BP (!) 158/81   Pulse 94   Temp 98.1 F (36.7 C)   Resp 20   Ht 5\' 9"  (1.753 m)   Wt 108.9 kg   SpO2 97%   BMI 35.44 kg/m  Physical Exam Vitals and nursing note reviewed.  Constitutional:      General: He is not in acute distress.    Appearance: He is well-developed.  HENT:     Head: Normocephalic and atraumatic.  Eyes:     Conjunctiva/sclera: Conjunctivae normal.  Pulmonary:     Effort: Pulmonary effort is normal. No respiratory distress.     Breath sounds: No stridor.  Abdominal:     General: There is no distension.  Genitourinary:    Comments: Patient describes lower abdominal pain Skin:  General: Skin is warm and dry.  Neurological:     Mental Status: He is alert and oriented to person, place, and time.     ED Results / Procedures / Treatments   Labs (all labs ordered are listed, but only  abnormal results are displayed) Labs Reviewed  URINALYSIS, ROUTINE W REFLEX MICROSCOPIC - Abnormal; Notable for the following components:      Result Value   APPearance HAZY (*)    Hgb urine dipstick MODERATE (*)    Protein, ur 30 (*)    Leukocytes,Ua LARGE (*)    All other components within normal limits    EKG None  Radiology No results found.  Procedures Procedures    Medications Ordered in ED Medications  lidocaine (XYLOCAINE) 2 % jelly 1 Application (1 Application Topical Given 01/12/23 1424)    ED Course/ Medical Decision Making/ A&P                                 Medical Decision Making Patient presents with suprapubic pain, inability to urinate, clear concern for acute urinary obstruction.  He is afebrile, but considerations include prostate enlargement, infection, prior urethral trauma. Stat bedside ultrasound from nurse with greater than 500 mL of fluid in the bladder.  Subsequently ordered lidocaine the patient had catheterization, performed without complication by nursing staff. Urinalysis reviewed, discussed with family, no evidence for infection.  Patient will follow-up with urology.  Amount and/or Complexity of Data Reviewed Independent Historian: spouse External Data Reviewed: notes. Labs: ordered. Decision-making details documented in ED Course.    Details: UA unremarkable  Risk Prescription drug management. Decision regarding hospitalization.   3:09 PM Patient awake, alert, in no distress feeling markedly better after catheter placement.        Final Clinical Impression(s) / ED Diagnoses Final diagnoses:  Urinary retention    Rx / DC Orders ED Discharge Orders     None         Gerhard Munch, MD 01/12/23 [phone]

## 2023-01-12 NOTE — ED Triage Notes (Signed)
Pt had prostate biopsy on 9/25, since then he has had issues emptying his bladder, but he has not peed since before noon yesterday. Pt was given rx for finasteride on 9/26. Pt also c.o constipation, states last Bm was 2 weeks ago, still passing gas

## 2023-01-15 NOTE — Progress Notes (Deleted)
Assessment and Plan:   Essential hypertension - continue medications, DASH diet, exercise and monitor at home. Call if greater than 130/80.  -     CBC with Differential/Platelet -     CMP -     TSH   Hyperlipidemia, mixed -continue medications, check lipids, decrease fatty foods, increase activity.  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  Perform daily foot/skin check, notify office of any concerning changes.  Push fluids throughout the day and limit NSAID use Check A1C     Vitamin D deficiency Continue Vit D supplementation to maintain value in therapeutic level of 60-100    Medication management -     CBC with Differential/Platelet -     CMP - TSH   Morbid obesity - follow up 3 months for progress monitoring - increase veggies, decrease carbs, increase activity - long discussion about weight loss, diet, and exercise    Continue diet and meds as discussed. Further disposition pending results of labs. Discussed med's effects and SE's.   Over 30 minutes of exam, counseling, chart review, and critical decision making was performed.  Future Appointments  Date Time Provider Department Center  01/16/2023 11:00 AM Raynelle Dick, NP [facility] None  03/15/2023 11:00 AM Lucky Cowboy, MD [facility] None  06/11/2023 11:00 AM Raynelle Dick, NP [facility] None     This very nice 67 y.o.male presents for 3 month follow up with Hypertension, Hyperlipidemia, Pre-Diabetes and Vitamin D Deficiency.      Patient is treated for HTN & BP has been controlled at home. Currently on doxazosin 8 mg QD, Bisoprolol/HCTZ 10/6.25 mg qd. Today's   BP Readings from Last 3 Encounters:  01/12/23 126/87  10/16/22 128/80  08/09/22 110/68  Patient has had no complaints of any cardiac type chest pain, palpitations, dyspnea /  orthopnea / PND, dizziness, claudication, or dependent edema.  Hyperlipidemia is controlled with diet & meds, Rosuvastatin 40 mg QD. Patient denies myalgias or other med SE's. Last Lipids were  Lab Results  Component Value Date   CHOL 128 10/16/2022   HDL 38 (L) 10/16/2022   LDLCALC 69 10/16/2022   TRIG 129 10/16/2022   CHOLHDL 3.4 10/16/2022   Also, the patient has abnormal glucose and has had no symptoms of reactive hypoglycemia, diabetic polys, paresthesias or visual blurring. He was taking Mounjaro but never got it filled.  Last A1c was  Lab Results  Component Value Date   HGBA1C 6.2 (H) 10/16/2022   Lab Results  Component Value Date   GFRAA 85 01/25/2020   Further, the patient also has history of Vitamin D Deficiency and supplements vitamin D without any suspected side-effects. Last vitamin D was   Lab Results  Component Value Date   VD25OH 114 (H) 10/16/2022   BMI is There is no height or weight on file to calculate BMI., he is not working on diet or exercise.  He tries to limit portions.  Limits red meat and tries to limit White bread, white, rice, potatoes and pasta.  Wt Readings from Last 3 Encounters:  01/12/23 240 lb (108.9 kg)  10/16/22 249 lb 9.6 oz (113.2 kg)  08/09/22 251 lb 3.2 oz (113.9 kg)    Current Outpatient Medications on  File Prior to Visit  Medication Sig   bisoprolol-hydrochlorothiazide (ZIAC) 10-6.25 MG tablet TAKE 1 TABLET BY MOUTH DAILY FOR BLOOD PRESSURE   doxazosin (CARDURA) 8 MG tablet Take  1 tablet  at Bedtime  for BP & Prostate                                                              /                                                                   TAKE                                         BY                                                 MOUTH   Ferrous Sulfate (IRON PO) Take 65 mg by mouth daily.   meloxicam (MOBIC) 15 MG tablet Take  1/2 to 1 tablet  Daily  with Food for Pain & Inflammation & try limit to 5 tablets /week to Avoid  Kidney Damage   metFORMIN (GLUCOPHAGE) 500 MG tablet Take   1 tablet  2 x /day with Meals   oxybutynin (DITROPAN) 5 MG tablet Take  1 tablet 3 x /day for Bladder Control                                                                                    /                                                    TAKE                                      BY                                            MOUTH (Patient taking differently: Take  1 tablet 3 x /day for Bladder Control)   phentermine (ADIPEX-P) 37.5 MG tablet Take 1/2 to 1 tablet every Morning for Dieting & Weight Loss   (Dx: e66.01)   rosuvastatin (CRESTOR)  40 MG tablet Take  1 tablet  Daily   for Cholesterol                                                                         /                                                   TAKE                                                    BY                                                   MOUTH (Patient taking differently: Take  1 tablet  Daily   for Cholesterol)   tadalafil (CIALIS) 20 MG tablet Take 1/2 to 1 tablet every 2 to 3 days as needed for XXXX   zinc gluconate 50 MG tablet Take 50 mg by mouth daily.   No current facility-administered medications on file prior to visit.   No Known Allergies PMHx:   Past Medical History:  Diagnosis Date   Anemia    ED (erectile dysfunction)    Enlarged prostate    Hyperlipidemia    Hypertension    Hypogonadism male    Other abnormal glucose    Overactive bladder    Sleep apnea    mild, no cpap   Unspecified vitamin D deficiency    Immunization History  Administered Date(s) Administered   PPD Test 05/06/2013, 05/07/2014, 08/18/2015, 10/04/2016, 11/19/2017, 12/24/2018, 01/25/2020   Pfizer Covid-19 Vaccine Bivalent Booster 10yrs & up 02/25/2021   Td 04/02/2005   Tdap 08/18/2015   Past Surgical History:  Procedure Laterality Date   COLONOSCOPY  2015   hx polyp   POLYPECTOMY     colon polyps   FHx:    Reviewed / unchanged  SHx:    Reviewed  / unchanged    Physical Exam  There were no vitals taken for this visit.  Appears well nourished, well groomed  and in no distress.  Eyes: PERRLA, EOMs, conjunctiva no swelling or erythema. Sinuses: No frontal/maxillary tenderness ENT/Mouth: EAC's clear, TM's nl w/o erythema, bulging. Nares clear w/o erythema, swelling, exudates. Oropharynx clear without erythema or exudates. Oral hygiene is good. Tongue normal, non obstructing. Hearing intact.  Neck: Supple. Thyroid not palpable. Car 2+/2+ without bruits, nodes or JVD. Chest: Respirations nl with BS clear & equal w/o rales, rhonchi, wheezing or stridor.  Cor: Heart sounds normal w/ regular rate and rhythm without sig. murmurs, gallops, clicks or rubs. Peripheral pulses normal and equal  without edema.  Abdomen: Soft & bowel sounds normal. Non-tender w/o guarding, rebound, hernias, masses or organomegaly.  Lymphatics: Unremarkable.  Musculoskeletal: Full ROM all peripheral extremities, joint stability, 5/5 strength  and normal gait.  Skin: Warm, dry without exposed rashes, lesions or ecchymosis apparent.  Neuro: Cranial nerves intact, reflexes equal bilaterally. Sensory-motor testing grossly intact. Tendon reflexes grossly intact.  Pysch: Alert & oriented x 3.  Insight and judgement nl & appropriate. No ideations.   [facility]  01/15/2023

## 2023-01-16 ENCOUNTER — Ambulatory Visit (INDEPENDENT_AMBULATORY_CARE_PROVIDER_SITE_OTHER): Payer: Medicare Other | Admitting: Nurse Practitioner

## 2023-01-16 ENCOUNTER — Ambulatory Visit: Payer: Medicare Other | Admitting: Nurse Practitioner

## 2023-01-16 ENCOUNTER — Encounter: Payer: Self-pay | Admitting: Nurse Practitioner

## 2023-01-16 VITALS — BP 102/70 | HR 76 | Temp 97.6°F | Ht 68.5 in | Wt 236.6 lb

## 2023-01-16 DIAGNOSIS — Z978 Presence of other specified devices: Secondary | ICD-10-CM | POA: Diagnosis not present

## 2023-01-16 DIAGNOSIS — E1169 Type 2 diabetes mellitus with other specified complication: Secondary | ICD-10-CM

## 2023-01-16 DIAGNOSIS — N401 Enlarged prostate with lower urinary tract symptoms: Secondary | ICD-10-CM | POA: Diagnosis not present

## 2023-01-16 DIAGNOSIS — Z09 Encounter for follow-up examination after completed treatment for conditions other than malignant neoplasm: Secondary | ICD-10-CM | POA: Diagnosis not present

## 2023-01-16 DIAGNOSIS — E559 Vitamin D deficiency, unspecified: Secondary | ICD-10-CM

## 2023-01-16 DIAGNOSIS — N179 Acute kidney failure, unspecified: Secondary | ICD-10-CM | POA: Diagnosis not present

## 2023-01-16 DIAGNOSIS — R7989 Other specified abnormal findings of blood chemistry: Secondary | ICD-10-CM

## 2023-01-16 DIAGNOSIS — E782 Mixed hyperlipidemia: Secondary | ICD-10-CM

## 2023-01-16 DIAGNOSIS — N183 Chronic kidney disease, stage 3 unspecified: Secondary | ICD-10-CM

## 2023-01-16 DIAGNOSIS — R7303 Prediabetes: Secondary | ICD-10-CM | POA: Diagnosis not present

## 2023-01-16 DIAGNOSIS — Z79899 Other long term (current) drug therapy: Secondary | ICD-10-CM

## 2023-01-16 DIAGNOSIS — N138 Other obstructive and reflux uropathy: Secondary | ICD-10-CM | POA: Diagnosis not present

## 2023-01-16 DIAGNOSIS — R339 Retention of urine, unspecified: Secondary | ICD-10-CM | POA: Diagnosis not present

## 2023-01-16 DIAGNOSIS — N1831 Chronic kidney disease, stage 3a: Secondary | ICD-10-CM

## 2023-01-16 DIAGNOSIS — I10 Essential (primary) hypertension: Secondary | ICD-10-CM

## 2023-01-16 NOTE — Patient Instructions (Signed)
Indwelling Urinary Catheter Care, Adult An indwelling urinary catheter is a thin tube that is put into your bladder. The tube helps to drain pee (urine) out of your body. The tube goes in through your urethra. Your urethra is where pee comes out of your body. Your pee will come out through the catheter, then it will go into a bag (drainage bag). Take good care of your catheter so it will work well. What are the risks? Germs may get into your bladder and cause an infection. The tube can become blocked. Tissue near the catheter may become irritated and may bleed. How to wear your catheter and drainage bag Supplies needed Sticky tape (adhesive tape) or a leg strap. Alcohol wipe or soap and water (if you use tape). A clean towel (if you use tape). Large overnight bag. Smaller bag (leg bag). Wearing your catheter Attach your catheter to your leg with tape or a leg strap. Make sure the catheter is not pulled tight. If a leg strap gets wet, take it off and put on a dry strap. If you use tape to hold the bag on your leg: Use an alcohol wipe or soap and water to wash your skin where the tape made it sticky before. Use a clean towel to pat-dry that skin. Use new tape to make the bag stay on your leg. Wearing your bags You should have been given a large overnight bag. You may wear the overnight bag in the day or night. Always have the overnight bag lower than your bladder.  Do not let the bag touch the floor. Before you go to sleep, put a clean plastic bag in a wastebasket. Then, hang the overnight bag inside the wastebasket. You should also have a smaller leg bag that fits under your clothes. Wear the leg bag as told by the product maker. This may be above or below the knee, depending on the length of the tubing. Make sure that the leg bag is below the bladder. Make sure that the tubing does not have loops or too much tension. Do not wear your leg bag at night. How to care for your skin and  catheter Supplies needed A clean washcloth. Water and mild soap. A clean towel. Caring for your skin and catheter     Clean the skin around your catheter every day. Wash your hands with soap and water. Wet a clean washcloth in warm water and mild soap. Clean the skin around your urethra. If you are male: Gently spread the folds of skin around your vagina (labia). With the washcloth in your other hand, wipe the inner side of your labia on each side. Wipe from front to back. If you are male: Pull back any skin that covers the end of your penis (foreskin). With the washcloth in your other hand, wipe your penis in small circles. Start wiping at the tip of your penis, then move away from the catheter. Move the foreskin back in place, if needed. With your free hand, hold the catheter close to where it goes into your body. Keep holding the catheter during cleaning so it does not get pulled out. With the washcloth in your other hand, clean the catheter. Only wipe downward on the catheter, toward the drainage bag. Do not wipe upward toward your body. Doing this may push germs into your urethra and cause infection. Use a clean towel to pat-dry the catheter and the skin around it. Make sure to wipe off all soap. Wash  your hands with soap and water. Shower every day. Do not take baths. Do not use cream, ointment, or lotion on the area where the catheter goes into your body, unless your doctor tells you to. Do not use powders, sprays, or lotions on your genital area. Check your skin around the catheter every day for signs of infection. Check for: Redness, swelling, or pain. Fluid or blood. Warmth. Pus or a bad smell. How to empty the bag Supplies needed Rubbing alcohol. Gauze pad or cotton ball. Tape or a leg strap. Emptying the bag Pour the pee out of your bag when it is ?- full, or at least 2-3 times a day. Do this for your overnight bag and your leg bag. Wash your hands with soap  and water. Separate (detach) the bag from your leg. Hold the bag over the toilet or a clean pail. Keep the bag lower than your hips and bladder. This is so the pee (urine) does not go back into the tube. Open the pour spout. It is at the bottom of the bag. Empty the pee into the toilet or pail. Do not let the pour spout touch any surface. Put rubbing alcohol on a gauze pad or cotton ball. Use the gauze pad or cotton ball to clean the pour spout. Close the pour spout. Attach the bag to your leg with tape or a leg strap. Wash your hands with soap and water. Follow instructions for cleaning the drainage bag. Instructions can come from: The product maker. Your doctor. How to change the bag Changing the bag Replace your bag when it starts to leak, smell bad, or look dirty. Wash your hands with soap and water. Separate the dirty bag from your leg. Pinch the catheter with your fingers so that pee does not spill out. Separate the catheter tube from the bag tube where these tubes connect (at the connection valve). Do not let the tubes touch any surface. Clean the end of the catheter tube with an alcohol wipe. Use a different alcohol wipe to clean the end of the bag tube. Connect the catheter tube to the tube of the clean bag. Attach the clean bag to your leg with tape or a leg strap. Do not make the bag tight on your leg. Wash your hands with soap and water. General instructions  Never pull on your catheter. Never try to take it out. Doing that can hurt you. Always wash your hands before and after you touch your catheter or bag. Use a mild, fragrance-free soap. If you do not have soap and water, use hand sanitizer. Always make sure there are no twists, bends, or kinks in the catheter tube. Always make sure there are no leaks in the catheter or bag. Drink enough fluid to keep your pee pale yellow. Do not take baths, swim, or use a hot tub. If you are male, wipe from front to back after you  poop (have a bowel movement). Contact a doctor if: Your catheter gets clogged. Your catheter leaks. You have signs of infection at the catheter site, such as: Redness, swelling, or pain where the catheter goes into your body. Fluid, blood, pus, or a bad smell coming from the area where the catheter goes into your body. Skin feels warm where the catheter goes into your body. You have signs of a bladder infection, such as: Fever. Chills. Pee smells worse than usual. Cloudy pee. Pain in your belly, legs, lower back, or bladder. Vomiting or feel like  vomiting. Get help right away if: You see blood in the catheter. Your pee is pink or red. Your bladder feels full. Your pee is not draining into the bag. Your catheter gets pulled out. Summary An indwelling urinary catheter is a thin tube that is placed into the bladder to help drain pee (urine) out of the body. The catheter is placed into the part of the body that drains pee from the bladder (urethra). Taking good care of your catheter will keep it working well. Always wash your hands before and after touching your catheter or bag. Never pull on your catheter or try to take it out. This information is not intended to replace advice given to you by your health care provider. Make sure you discuss any questions you have with your health care provider. Document Revised: 11/17/2020 Document Reviewed: 11/17/2020 Elsevier Patient Education  2023 [employer].

## 2023-01-16 NOTE — Progress Notes (Signed)
Hospital follow up  Assessment and Plan: Hospital visit follow up for:   Hospital discharge follow-up Reviewed discharge instructions in full including medication changes, diagnostics, labs, and future follow ups appointment. All questions and concerns addressed.   - CBC with Differential/Platelet - COMPLETE METABOLIC PANEL WITH GFR  BPH with obstruction/lower urinary tract symptoms Urology following - has been instructed to keep indwelling foley catheter intact - has TKA Left scheduled for 01/25/23. Has f/u with Urology 01/22/23. Continue Doxazosin.  Urinary retention Resolved considering indwelling foley catheter Monitor urine for any increase in infection.  - CBC with Differential/Platelet - COMPLETE METABOLIC PANEL WITH GFR  AKI (acute kidney injury) (HCC) Stay well hydrated. Avoid high salt foods. Avoid NSAIDS. Keep BP and BG well controlled.   Take medications as prescribed. Remain active and exercise as tolerated daily. Maintain weight.  Continue to monitor. Check CMP/GFR/Microablumin  - COMPLETE METABOLIC PANEL WITH GFR  High serum vitamin D/Vitamin D Deficiency Monitor levels  - Hemoglobin A1c  Prediabetes Education: Reviewed 'ABCs' of diabetes management  Discussed goals to be met and/or maintained include A1C (<7) Blood pressure (<130/80) Cholesterol (LDL <70) Continue Eye Exam yearly  Continue Dental Exam Q6 mo Discussed dietary recommendations Discussed Physical Activity recommendations Check A1C  - VITAMIN D 25 Hydroxy (Vit-D Deficiency, Fractures)  Medication management All medications discussed and reviewed in full. All questions and concerns regarding medications addressed.    - CBC with Differential/Platelet - COMPLETE METABOLIC PANEL WITH GFR - Hemoglobin A1c - VITAMIN D 25 Hydroxy (Vit-D Deficiency, Fractures) - Urinalysis, Routine w reflex microscopic - Urine Culture  Indwelling Foley catheter present Continue catheter care -  assess for changes to urine color, blood in urine, increase in fever, chills, N/V, dysuria.  - Urinalysis, Routine w reflex microscopic - Urine Culture    All medications were reviewed with patient and family and fully reconciled. All questions answered fully, and patient and family members were encouraged to call the office with any further questions or concerns. Discussed goal to avoid readmission related to this diagnosis.   Over 40 minutes of exam, counseling, chart review, and complex, high/moderate level critical decision making was performed this visit.   Future Appointments  Date Time Provider Department Center  01/16/2023  2:00 PM Adela Glimpse, NP [facility] None  03/15/2023 11:00 AM Lucky Cowboy, MD [facility] None  06/11/2023 11:00 AM Raynelle Dick, NP [facility] None     HPI 67 y.o.male presents for follow up for transition from recent hospitalization or SNIF stay. Admit date to the hospital was 01/12/23, patient was discharged from the hospital on 01/12/23 and our clinical staff contacted the office the day after discharge to set up a follow up appointment. The discharge summary, medications, and diagnostic test results were reviewed before meeting with the patient. The patient was admitted for:   Presented with inability to urinate. History is notable for prostate biopsy 2 weeks prior to being seen. The day following that he had an episode of urinary retention requiring catheter placement. That catheter was removed 8 days prior to the ER visit. He noted that since that time is doing generally well until a few hours ago when he went unable to urinate. Some suprapubic discomfort otherwise no abdominal pain, chest pain, fever, vomiting.   Concern was for acute urinary obstruction.  Had >500 mL of fluid in bladder.  Indwelling catheter was placed.  He was asked to follow up with urology.    He continues to have indwelling foley.  Has now spoken with Urology and will follow  up 01/22/23.  Recommended to keep foley in place until seen.  Of note he is planning to have left TKA 02/25/23.     He was noted to have drop in GFR 10/2022.   Lab Results  Component Value Date   EGFR 55 (L) 10/16/2022    Vitamin D was also found to be elevated 10/2022.  He decreased dose to weekly. Last vitamin D Lab Results  Component Value Date   VD25OH 114 (H) 10/16/2022   Home health is not involved.   Images while in the hospital: No results found.   Current Outpatient Medications (Endocrine & Metabolic):    metFORMIN (GLUCOPHAGE) 500 MG tablet, Take   1 tablet  2 x /day with Meals  Current Outpatient Medications (Cardiovascular):    bisoprolol-hydrochlorothiazide (ZIAC) 10-6.25 MG tablet, TAKE 1 TABLET BY MOUTH DAILY FOR BLOOD PRESSURE   doxazosin (CARDURA) 8 MG tablet, Take  1 tablet  at Bedtime  for BP & Prostate                                                              /                                                                   TAKE                                         BY                                                 MOUTH   rosuvastatin (CRESTOR) 40 MG tablet, Take  1 tablet  Daily   for Cholesterol                                                                         /                                                   TAKE                                                    BY  MOUTH (Patient taking differently: Take  1 tablet  Daily   for Cholesterol)   tadalafil (CIALIS) 20 MG tablet, Take 1/2 to 1 tablet every 2 to 3 days as needed for XXXX   Current Outpatient Medications (Analgesics):    meloxicam (MOBIC) 15 MG tablet, Take  1/2 to 1 tablet  Daily  with Food for Pain & Inflammation & try limit to 5 tablets /week to Avoid Kidney Damage  Current Outpatient Medications (Hematological):    Ferrous Sulfate (IRON PO), Take 65 mg by mouth daily.  Current Outpatient Medications (Other):    oxybutynin  (DITROPAN) 5 MG tablet, Take  1 tablet 3 x /day for Bladder Control                                                                                    /                                                    TAKE                                      BY                                            MOUTH (Patient taking differently: Take  1 tablet 3 x /day for Bladder Control)   phentermine (ADIPEX-P) 37.5 MG tablet, Take 1/2 to 1 tablet every Morning for Dieting & Weight Loss   (Dx: e66.01)   zinc gluconate 50 MG tablet, Take 50 mg by mouth daily.  Past Medical History:  Diagnosis Date   Anemia    ED (erectile dysfunction)    Enlarged prostate    Hyperlipidemia    Hypertension    Hypogonadism male    Other abnormal glucose    Overactive bladder    Sleep apnea    mild, no cpap   Unspecified vitamin D deficiency      No Known Allergies  ROS: all negative except above.   Physical Exam: There were no vitals filed for this visit. There were no vitals taken for this visit. General Appearance: Well nourished, in no apparent distress. Eyes: PERRLA, EOMs, conjunctiva no swelling or erythema Sinuses: No Frontal/maxillary tenderness ENT/Mouth: Ext aud canals clear, TMs without erythema, bulging. No erythema, swelling, or exudate on post pharynx.  Tonsils not swollen or erythematous. Hearing normal.  Neck: Supple, thyroid normal.  Respiratory: Respiratory effort normal, BS equal bilaterally without rales, rhonchi, wheezing or stridor.  Cardio: RRR with no MRGs. Brisk peripheral pulses without edema.  Abdomen: Soft, + BS.  Non tender, no guarding, rebound, hernias, masses. Lymphatics: Non tender without lymphadenopathy.  Musculoskeletal: Full ROM, 5/5 strength, normal gait.  Skin: Warm, dry without rashes, lesions, ecchymosis.  Neuro: Cranial nerves intact. Normal muscle tone, no cerebellar symptoms.  Sensation intact.  Psych: Awake and oriented X 3, normal affect, Insight and Judgment appropriate.      Adela Glimpse, NP 1:19 PM [facility]

## 2023-01-18 ENCOUNTER — Other Ambulatory Visit: Payer: Self-pay | Admitting: Nurse Practitioner

## 2023-01-18 LAB — COMPLETE METABOLIC PANEL WITH GFR
AG Ratio: 1.4 (calc) (ref 1.0–2.5)
ALT: 12 U/L (ref 9–46)
AST: 12 U/L (ref 10–35)
Albumin: 4.1 g/dL (ref 3.6–5.1)
Alkaline phosphatase (APISO): 55 U/L (ref 35–144)
BUN: 18 mg/dL (ref 7–25)
CO2: 28 mmol/L (ref 20–32)
Calcium: 9.3 mg/dL (ref 8.6–10.3)
Chloride: 101 mmol/L (ref 98–110)
Creat: 1.21 mg/dL (ref 0.70–1.35)
Globulin: 2.9 g/dL (ref 1.9–3.7)
Glucose, Bld: 100 mg/dL — ABNORMAL HIGH (ref 65–99)
Potassium: 3.8 mmol/L (ref 3.5–5.3)
Sodium: 139 mmol/L (ref 135–146)
Total Bilirubin: 0.8 mg/dL (ref 0.2–1.2)
Total Protein: 7 g/dL (ref 6.1–8.1)
eGFR: 66 mL/min/{1.73_m2} (ref >=60)

## 2023-01-18 LAB — CBC WITH DIFFERENTIAL/PLATELET
Absolute Lymphocytes: 2666 {cells}/uL (ref 850–3900)
Absolute Monocytes: 795 {cells}/uL (ref 200–950)
Basophils Absolute: 34 {cells}/uL (ref 0–200)
Basophils Relative: 0.3 %
Eosinophils Absolute: 258 {cells}/uL (ref 15–500)
Eosinophils Relative: 2.3 %
HCT: 38.2 % — ABNORMAL LOW (ref 38.5–50.0)
Hemoglobin: 12.8 g/dL — ABNORMAL LOW (ref 13.2–17.1)
MCH: 29.1 pg (ref 27.0–33.0)
MCHC: 33.5 g/dL (ref 32.0–36.0)
MCV: 86.8 fL (ref 80.0–100.0)
MPV: 9.9 fL (ref 7.5–12.5)
Monocytes Relative: 7.1 %
Neutro Abs: 7448 {cells}/uL (ref 1500–7800)
Neutrophils Relative %: 66.5 %
Platelets: 210 10*3/uL (ref 140–400)
RBC: 4.4 10*6/uL (ref 4.20–5.80)
RDW: 12.7 % (ref 11.0–15.0)
Total Lymphocyte: 23.8 %
WBC: 11.2 10*3/uL — ABNORMAL HIGH (ref 3.8–10.8)

## 2023-01-18 LAB — URINE CULTURE
MICRO NUMBER:: 15604437
SPECIMEN QUALITY:: ADEQUATE

## 2023-01-18 LAB — URINALYSIS, ROUTINE W REFLEX MICROSCOPIC
Bilirubin Urine: NEGATIVE
Glucose, UA: NEGATIVE
Hyaline Cast: NONE SEEN /[LPF]
Ketones, ur: NEGATIVE
Nitrite: NEGATIVE
Specific Gravity, Urine: 1.018 (ref 1.001–1.035)
Squamous Epithelial / HPF: NONE SEEN /[HPF] (ref <=5)
pH: 5.5 (ref 5.0–8.0)

## 2023-01-18 LAB — HEMOGLOBIN A1C
Hgb A1c MFr Bld: 6.3 %{Hb} — ABNORMAL HIGH (ref <5.7)
Mean Plasma Glucose: 134 mg/dL
eAG (mmol/L): 7.4 mmol/L

## 2023-01-18 LAB — VITAMIN D 25 HYDROXY (VIT D DEFICIENCY, FRACTURES): Vit D, 25-Hydroxy: 95 ng/mL (ref 30–100)

## 2023-01-18 LAB — MICROSCOPIC MESSAGE

## 2023-01-18 MED ORDER — NITROFURANTOIN MONOHYD MACRO 100 MG PO CAPS
100.0000 mg | ORAL_CAPSULE | Freq: Two times a day (BID) | ORAL | 0 refills | Status: DC
Start: 2023-01-18 — End: 2023-01-23

## 2023-01-21 ENCOUNTER — Telehealth: Payer: Self-pay | Admitting: Nurse Practitioner

## 2023-01-21 ENCOUNTER — Other Ambulatory Visit: Payer: Self-pay | Admitting: Nurse Practitioner

## 2023-01-21 ENCOUNTER — Encounter: Payer: Self-pay | Admitting: Internal Medicine

## 2023-01-21 MED ORDER — NITROFURANTOIN MONOHYD MACRO 100 MG PO CAPS: 100.0000 mg | ORAL_CAPSULE | Freq: Two times a day (BID) | ORAL | 0 refills | Status: AC

## 2023-01-21 NOTE — Telephone Encounter (Signed)
Please sent Macrobid to [facility] on [address] instead of [facility]

## 2023-01-23 DIAGNOSIS — R310 Gross hematuria: Secondary | ICD-10-CM | POA: Diagnosis not present

## 2023-01-23 DIAGNOSIS — R338 Other retention of urine: Secondary | ICD-10-CM | POA: Diagnosis not present

## 2023-01-25 DIAGNOSIS — M1711 Unilateral primary osteoarthritis, right knee: Secondary | ICD-10-CM | POA: Diagnosis not present

## 2023-01-25 DIAGNOSIS — G8918 Other acute postprocedural pain: Secondary | ICD-10-CM | POA: Diagnosis not present

## 2023-01-25 DIAGNOSIS — M21161 Varus deformity, not elsewhere classified, right knee: Secondary | ICD-10-CM | POA: Diagnosis not present

## 2023-01-25 DIAGNOSIS — M25761 Osteophyte, right knee: Secondary | ICD-10-CM | POA: Diagnosis not present

## 2023-01-25 DIAGNOSIS — Z96651 Presence of right artificial knee joint: Secondary | ICD-10-CM | POA: Diagnosis not present

## 2023-01-28 DIAGNOSIS — Z96651 Presence of right artificial knee joint: Secondary | ICD-10-CM | POA: Diagnosis not present

## 2023-01-28 DIAGNOSIS — M25661 Stiffness of right knee, not elsewhere classified: Secondary | ICD-10-CM | POA: Diagnosis not present

## 2023-01-30 DIAGNOSIS — Z96651 Presence of right artificial knee joint: Secondary | ICD-10-CM | POA: Diagnosis not present

## 2023-01-30 DIAGNOSIS — M25661 Stiffness of right knee, not elsewhere classified: Secondary | ICD-10-CM | POA: Diagnosis not present

## 2023-02-01 ENCOUNTER — Encounter: Payer: Self-pay | Admitting: Internal Medicine

## 2023-02-01 DIAGNOSIS — M25661 Stiffness of right knee, not elsewhere classified: Secondary | ICD-10-CM | POA: Diagnosis not present

## 2023-02-01 DIAGNOSIS — Z96651 Presence of right artificial knee joint: Secondary | ICD-10-CM | POA: Diagnosis not present

## 2023-02-05 ENCOUNTER — Encounter: Payer: Medicare Other | Admitting: Internal Medicine

## 2023-02-05 DIAGNOSIS — M25661 Stiffness of right knee, not elsewhere classified: Secondary | ICD-10-CM | POA: Diagnosis not present

## 2023-02-05 DIAGNOSIS — Z96651 Presence of right artificial knee joint: Secondary | ICD-10-CM | POA: Diagnosis not present

## 2023-02-06 DIAGNOSIS — M25661 Stiffness of right knee, not elsewhere classified: Secondary | ICD-10-CM | POA: Diagnosis not present

## 2023-02-06 DIAGNOSIS — Z96651 Presence of right artificial knee joint: Secondary | ICD-10-CM | POA: Diagnosis not present

## 2023-02-08 ENCOUNTER — Other Ambulatory Visit: Payer: Self-pay | Admitting: Urology

## 2023-02-08 DIAGNOSIS — M25661 Stiffness of right knee, not elsewhere classified: Secondary | ICD-10-CM | POA: Diagnosis not present

## 2023-02-08 DIAGNOSIS — Z96651 Presence of right artificial knee joint: Secondary | ICD-10-CM | POA: Diagnosis not present

## 2023-02-12 DIAGNOSIS — M25661 Stiffness of right knee, not elsewhere classified: Secondary | ICD-10-CM | POA: Diagnosis not present

## 2023-02-12 DIAGNOSIS — Z96651 Presence of right artificial knee joint: Secondary | ICD-10-CM | POA: Diagnosis not present

## 2023-02-14 DIAGNOSIS — M25661 Stiffness of right knee, not elsewhere classified: Secondary | ICD-10-CM | POA: Diagnosis not present

## 2023-02-14 DIAGNOSIS — Z96651 Presence of right artificial knee joint: Secondary | ICD-10-CM | POA: Diagnosis not present

## 2023-02-18 DIAGNOSIS — Z96651 Presence of right artificial knee joint: Secondary | ICD-10-CM | POA: Diagnosis not present

## 2023-02-18 DIAGNOSIS — M25661 Stiffness of right knee, not elsewhere classified: Secondary | ICD-10-CM | POA: Diagnosis not present

## 2023-02-20 DIAGNOSIS — R8271 Bacteriuria: Secondary | ICD-10-CM | POA: Diagnosis not present

## 2023-02-20 DIAGNOSIS — R338 Other retention of urine: Secondary | ICD-10-CM | POA: Diagnosis not present

## 2023-02-21 ENCOUNTER — Encounter: Payer: Self-pay | Admitting: Internal Medicine

## 2023-02-22 DIAGNOSIS — R338 Other retention of urine: Secondary | ICD-10-CM | POA: Diagnosis not present

## 2023-02-26 DIAGNOSIS — M25661 Stiffness of right knee, not elsewhere classified: Secondary | ICD-10-CM | POA: Diagnosis not present

## 2023-02-26 DIAGNOSIS — Z96651 Presence of right artificial knee joint: Secondary | ICD-10-CM | POA: Diagnosis not present

## 2023-03-02 NOTE — Patient Instructions (Addendum)
SURGICAL WAITING ROOM VISITATION Patients having surgery or a procedure may have no more than 2 support people in the waiting area - these visitors may rotate in the visitor waiting room.   Due to an increase in RSV and influenza rates and associated hospitalizations, children ages 33 and under may not visit patients in [facility] hospitals. If the patient needs to stay at the hospital during part of their recovery, the visitor guidelines for inpatient rooms apply.  PRE-OP VISITATION  Pre-op nurse will coordinate an appropriate time for 1 support person to accompany the patient in pre-op.  This support person may not rotate.  This visitor will be contacted when the time is appropriate for the visitor to come back in the pre-op area.  Please refer to the [facility] website for the visitor guidelines for Inpatients (after your surgery is over and you are in a regular room).  You are not required to quarantine at this time prior to your surgery. However, you must do this: Hand Hygiene often Do NOT share personal items Notify your provider if you are in close contact with someone who has COVID or you develop fever 100.4 or greater, new onset of sneezing, cough, sore throat, shortness of breath or body aches.  If you test positive for Covid or have been in contact with anyone that has tested positive in the last 10 days please notify you surgeon.    Your procedure is scheduled on:  Friday   March 15, 2023  Report to [facility] Main Entrance: [place] entrance where the [employer] is available.   Report to admitting at: 07:00    AM  Call this number if you have any questions or problems the morning of surgery [phone]  DO NOT EAT OR DRINK ANYTHING AFTER MIDNIGHT THE NIGHT PRIOR TO YOUR SURGERY / PROCEDURE.   FOLLOW  ANY ADDITIONAL PRE OP INSTRUCTIONS YOU RECEIVED FROM YOUR SURGEON'S OFFICE!!!   Oral Hygiene is also important to reduce your risk of infection.         Remember - BRUSH YOUR TEETH THE MORNING OF SURGERY WITH YOUR REGULAR TOOTHPASTE  Do NOT smoke after Midnight the night before surgery.  STOP TAKING all Vitamins, Herbs and supplements 1 week before your surgery.   METFORMIN-  Day BEFORE surgery;  take usual doses,  DAY OF YOUR SURGERY: DO NOT TAKE METFORMIN  Take ONLY these medicines the morning of surgery with A SIP OF WATER: oxybutynin,                     You may not have any metal on your body including  jewelry, and body piercing  Do not wear lotions, powders, cologne, or deodorant  Men may shave face and neck.  Contacts, Hearing Aids, dentures or bridgework may not be worn into surgery. DENTURES WILL BE REMOVED PRIOR TO SURGERY PLEASE DO NOT APPLY "Poly grip" OR ADHESIVES!!!  You may bring a small overnight bag with you on the day of surgery, only pack items that are not valuable. [facility] IS NOT RESPONSIBLE   FOR VALUABLES THAT ARE LOST OR STOLEN.   Do not bring your home medications to the hospital. The Pharmacy will dispense medications listed on your medication list to you during your admission in the Hospital.  Please read over the following fact sheets you were given: IF YOU HAVE QUESTIONS ABOUT YOUR PRE-OP INSTRUCTIONS, PLEASE CALL [phone].   [place] [facility] - Preparing for Surgery Before  surgery, you can play an important role.  Because skin is not sterile, your skin needs to be as free of germs as possible.  You can reduce the number of germs on your skin by washing with CHG (chlorahexidine gluconate) soap before surgery.  CHG is an antiseptic cleaner which kills germs and bonds with the skin to continue killing germs even after washing. Please DO NOT use if you have an allergy to CHG or antibacterial soaps.  If your skin becomes reddened/irritated stop using the CHG and inform your nurse when you arrive at Short Stay. Do not shave (including legs and underarms) for at least 48 hours prior to the first CHG shower.   You may shave your face/neck.  Please follow these instructions carefully:  1.  Shower with CHG Soap the night before surgery and the  morning of surgery.  2.  If you choose to wash your hair, wash your hair first as usual with your normal  shampoo.  3.  After you shampoo, rinse your hair and body thoroughly to remove the shampoo.                             4.  Use CHG as you would any other liquid soap.  You can apply chg directly to the skin and wash.  Gently with a scrungie or clean washcloth.  5.  Apply the CHG Soap to your body ONLY FROM THE NECK DOWN.   Do not use on face/ open                           Wound or open sores. Avoid contact with eyes, ears mouth and genitals (private parts).                       Wash face,  Genitals (private parts) with your normal soap.             6.  Wash thoroughly, paying special attention to the area where your  surgery  will be performed.  7.  Thoroughly rinse your body with warm water from the neck down.  8.  DO NOT shower/wash with your normal soap after using and rinsing off the CHG Soap.            9.  Pat yourself dry with a clean towel.            10.  Wear clean pajamas.            11.  Place clean sheets on your bed the night of your first shower and do not  sleep with pets.  ON THE DAY OF SURGERY : Do not apply any lotions/deodorants the morning of surgery.  Please wear clean clothes to the hospital/surgery center.    FAILURE TO FOLLOW THESE INSTRUCTIONS MAY RESULT IN THE CANCELLATION OF YOUR SURGERY  PATIENT SIGNATURE_________________________________  NURSE SIGNATURE__________________________________  ________________________________________________________________________

## 2023-03-02 NOTE — Progress Notes (Signed)
COVID Vaccine received:  []  No [x]  Yes Date of any COVID positive Test in last 90 days:  PCP - Lucky Cowboy, MD Cardiologist - none  Chest x-ray -  EKG - 03-08-22 Epic   will repeat at PST  Stress Test -  ECHO -  Cardiac Cath -   PCR screen: []  Ordered & Completed []   No Order but Needs PROFEND     [x]   N/A for this surgery  Surgery Plan:  []  Ambulatory   [x]  Outpatient in bed  []  Admit Anesthesia:    [x]  General  []  Spinal  []   Choice []   MAC  Bowel Prep - [x]  No  []   Yes ______  Pacemaker / ICD device [x]  No []  Yes   Spinal Cord Stimulator:[x]  No []  Yes       History of Sleep Apnea? []  No [x]  Yes  mild CPAP used?- [x]  No []  Yes    Does the patient monitor blood sugar?   []  N/A   []  No []  Yes  Patient has: []  NO Hx DM   [x]  Pre-DM   []  DM1  []   DM2 Last A1c was:        on      Does patient have a [employer] or Dexacom? []  No []  Yes   Fasting Blood Sugar Ranges-  Checks Blood Sugar _____ times a day  Diabetic medications/ instructions: Metformin 500 bid Will hold DOS Blood Thinner / Instructions: none Aspirin Instructions:  none  ERAS Protocol Ordered: [x]  No  []  Yes Patient is to be NPO after: midnight prior  Dental hx: []  Dentures:  []  N/A      []  Bridge or Partial:                   []  Loose or Damaged teeth:   Comments:   Activity level: Patient is able / unable to climb a flight of stairs without difficulty; []  No CP  []  No SOB, but would have ___   Patient can / can not perform ADLs without assistance.   Anesthesia review: HTN, Pre-DM, anemia, Mild OSA- no CPAP, current Phentermine use.   Patient denies shortness of breath, fever, cough and chest pain at PAT appointment.  Patient verbalized understanding and agreement to the Pre-Surgical Instructions that were given to them at this PAT appointment. Patient was also educated of the need to review these PAT instructions again prior to his surgery.I reviewed the appropriate phone numbers to call if they  have any and questions or concerns.

## 2023-03-04 ENCOUNTER — Encounter (HOSPITAL_COMMUNITY)
Admission: RE | Admit: 2023-03-04 | Discharge: 2023-03-04 | Disposition: A | Discharge status: Home / Self Care | Payer: Medicare Other | Source: Ambulatory Visit | Attending: Anesthesiology | Admitting: Anesthesiology

## 2023-03-04 DIAGNOSIS — Z79899 Other long term (current) drug therapy: Secondary | ICD-10-CM

## 2023-03-04 DIAGNOSIS — Z01818 Encounter for other preprocedural examination: Secondary | ICD-10-CM

## 2023-03-04 DIAGNOSIS — I10 Essential (primary) hypertension: Secondary | ICD-10-CM

## 2023-03-08 DIAGNOSIS — R338 Other retention of urine: Secondary | ICD-10-CM | POA: Diagnosis not present

## 2023-03-14 ENCOUNTER — Encounter: Payer: Self-pay | Admitting: Internal Medicine

## 2023-03-14 MED ORDER — METFORMIN HCL ER 500 MG PO TB24: ORAL_TABLET | ORAL | 3 refills | Status: DC

## 2023-03-14 NOTE — Patient Instructions (Signed)

## 2023-03-14 NOTE — Progress Notes (Unsigned)
[facility]      ADULT   &   ADOLESCENT      INTERNAL MEDICINE  Lucky Cowboy, M.D.          Rance Muir, ANP        Adela Glimpse, FNP  [facility] [address]  [place], [place]. [address] Telephone [phone] Telefax [phone]   Annual  Screening/Preventative Visit  & Comprehensive Evaluation & Examination   Future Appointments  Date Time Provider Department  03/15/2023                cpe 11:00 AM Lucky Cowboy, MD [facility]  06/11/2023                ov & wellness 11:00 AM Raynelle Dick, NP [facility]  03/27/2024                cpe 10:00 AM Lucky Cowboy, MD [facility]            This very nice 67 y.o. MBM with  HTN, HLD, Prediabetes and Vitamin D Deficiency presents for a Screening /Preventative Visit & comprehensive evaluation and management of multiple medical co-morbidities.  Patient has Morbid Obesity ( BMI 35+) with consequent HTN, HLD & T2_DM.        Patient is currently following with Dr Bjorn Pippin for an elevated PSA & had A Prostate MRI  & tentatively is anticipated to have a prostate bx.  Patient did have a Enterococcus (+)  UTI  in mid October with Macrobid.        HTN predates since  2005. Patient's BP has been controlled at home.  Today's BP is at goal -  118/70 . Patient denies any cardiac symptoms as chest pain, palpitations, shortness of breath, dizziness or ankle swelling .       Patient's hyperlipidemia is controlled with diet /Rosuvastatin. Patient denies myalgias or other medication SE's. Last lipids were at goal :  Lab Results  Component Value Date   CHOL 128 10/16/2022   HDL 38 (L) 10/16/2022   LDLCALC 69 10/16/2022   TRIG 129 10/16/2022   CHOLHDL 3.4 10/16/2022         Patient has Morbid Obesity (BMI 38.7) & consequent prediabetes (A1c 6.4% /2011) and in Feb 2012 had an A1c 6.5%  dx of T2_DM & he has CKD3a  (GFR 55).  He has been attempting dietary control. Patient denies reactive hypoglycemic  symptoms, visual blurring, diabetic polys or paresthesias. Last A1c was not at goal :  Lab Results  Component Value Date   HGBA1C 6.3 (H) 01/16/2023         Finally, patient has history of Vitamin D Deficiency ("19" /2008) and last vitamin D was at goal :   Lab Results  Component Value Date   VD25OH 95 01/16/2023      Current Outpatient Medications  Medication Instructions   bisoprolol-hctz 10-6.25 MG tablet TAKE 1 TABLET  DAILY   doxazos 8 MG tablet Take  1 tablet  at Bedtime    Ferrous Sulfate (IRON)  65 mg  Daily   meloxicam 15 MG tablet Take  1/2 to 1 tablet  Daily  with Food   metFORMIN-XR 500 MG 24 hr tablet Take 1 to 2 tablets 2 x /day with Meals f   oxybutynin 5 MG tablet Take  1 tablet 3 x /day for    phentermine 37.5 MG tablet Take 1/2 to 1 tablet every Morning   Rosuvastatin 40  MG tablet Take  1 tablet  Daily      tadalafil (CIALIS) 20 MG tablet Take 1/2 to 1 tablet every 2 to 3 days as needed    zinc gluconate 50 mg, Oral, Daily     No Known Allergies    Past Medical History:  Diagnosis Date   Anemia    ED (erectile dysfunction)    Hyperlipidemia    Hypertension    Hypogonadism male    Other abnormal glucose    Overactive bladder    Sleep apnea    mild, no cpap   Unspecified vitamin D deficiency      Health Maintenance  Topic Date Due   COVID-19 Vaccine (1) Never done   Zoster Vaccines- Shingrix (1 of 2) Never done   INFLUENZA VACCINE  Never done   TETANUS/TDAP  08/17/2025   COLONOSCOPY  02/22/2026   Hepatitis C Screening  Completed   HIV Screening  Completed   HPV VACCINES  Aged Out     Immunization History  Administered Date(s) Administered   PPD Test 11/19/2017, 12/24/2018, 01/25/2020   Td 04/02/2005   Tdap 08/18/2015    Last Colon - 02/23/2019- Dr Christella Hartigan - Recc 7 yr  f/u - due Nov /Dec 2027    Past Surgical History:  Procedure Laterality Date   COLONOSCOPY  2015   hx polyp   POLYPECTOMY     colon polyps     Family  History  Problem Relation Age of Onset   Heart disease Mother    Hyperlipidemia Mother    Hypertension Mother    Cancer Father        lung, prostate   Colon cancer Neg Hx    Rectal cancer Neg Hx    Stomach cancer Neg Hx    Esophageal cancer Neg Hx      Social History   Tobacco Use   Smoking status: Never   Smokeless tobacco: Never  Vaping Use   Vaping Use: Never used  Substance Use Topics   Alcohol use: No   Drug use: No      ROS Constitutional: Denies fever, chills, weight loss/gain, headaches, insomnia,  night sweats or change in appetite. Does c/o fatigue. Eyes: Denies redness, blurred vision, diplopia, discharge, itchy or watery eyes.  ENT: Denies discharge, congestion, post nasal drip, epistaxis, sore throat, earache, hearing loss, dental pain, Tinnitus, Vertigo, Sinus pain or snoring.  Cardio: Denies chest pain, palpitations, irregular heartbeat, syncope, dyspnea, diaphoresis, orthopnea, PND, claudication or edema Respiratory: denies cough, dyspnea, DOE, pleurisy, hoarseness, laryngitis or wheezing.  Gastrointestinal: Denies dysphagia, heartburn, reflux, water brash, pain, cramps, nausea, vomiting, bloating, diarrhea, constipation, hematemesis, melena, hematochezia, jaundice or hemorrhoids Genitourinary: Denies dysuria, frequency, urgency, nocturia, hesitancy, discharge, hematuria or flank pain Musculoskeletal: Denies arthralgia, myalgia, stiffness, Jt. Swelling, pain, limp or strain/sprain. Denies Falls. Skin: Denies puritis, rash, hives, warts, acne, eczema or change in skin lesion Neuro: No weakness, tremor, incoordination, spasms, paresthesia or pain Psychiatric: Denies confusion, memory loss or sensory loss. Denies Depression. Endocrine: Denies change in weight, skin, hair change, nocturia, and paresthesia, diabetic polys, visual blurring or hyper / hypo glycemic episodes.  Heme/Lymph: No excessive bleeding, bruising or enlarged lymph nodes.   Physical Exam  BP  140/78   Pulse 63   Temp 97.8 F (36.6 C)   Resp 16   Ht 5' 8.5" (1.74 m)   Wt 258 lb 3.2 oz (117.1 kg)   SpO2 96%   BMI 38.7 kg/m   General Appearance:  Over nourished and  and in no apparent distress.  Eyes: PERRLA, EOMs, conjunctiva no swelling or erythema, normal fundi and vessels. Sinuses: No frontal/maxillary tenderness ENT/Mouth: EACs patent / TMs  nl. Nares clear without erythema, swelling, mucoid exudates. Oral hygiene is good. No erythema, swelling, or exudate. Tongue normal, non-obstructing. Tonsils not swollen or erythematous. Hearing normal.  Neck: Supple, thyroid not palpable. No bruits, nodes or JVD. Respiratory: Respiratory effort normal.  BS equal and clear bilateral without rales, rhonci, wheezing or stridor. Cardio: Heart sounds are normal with regular rate and rhythm and no murmurs, rubs or gallops. Peripheral pulses are normal and equal bilaterally without edema. No aortic or femoral bruits. Chest: symmetric with normal excursions and percussion.  Abdomen: Soft, Rotund with Nl bowel sounds. Nontender, no guarding, rebound, hernias, masses, or organomegaly.  Lymphatics: Non tender without lymphadenopathy.  Musculoskeletal: Full ROM all peripheral extremities, joint stability, 5/5 strength, and normal gait. Skin: Warm and dry without rashes, lesions, cyanosis, clubbing or  ecchymosis.  Neuro: Cranial nerves intact, reflexes equal bilaterally. Normal muscle tone, no cerebellar symptoms. Sensation intact.  Pysch: Alert and oriented X 3 with normal affect, insight and judgment appropriate.   Assessment and Plan   Annual Preventative Exam   2. Severe obesity (BMI 35.0-35.9 with comorbidity) (HCC)  - TSH   3. Essential hypertension  - EKG 12-Lead - [place], RETROPERITNL ABD,  LTD - CBC with Differential/Platelet - COMPLETE METABOLIC PANEL WITH GFR - Magnesium - TSH   4. Hyperlipidemia associated with type 2 diabetes mellitus (HCC)  - EKG 12-Lead - [place],  RETROPERITNL ABD,  LTD - Lipid panel  5. Type 2 diabetes mellitus with stage 3a chronic kidney                                             disease, without long-term current use of insulin (HCC)  - metFORMIN -XR 500 MG 24 hr tablet;  Take 1 to 2 tablets 2 x /day with Meals for Diabetes.   Dispense: 360 tablet; Refill: 3  - EKG 12-Lead - [place], RETROPERITNL ABD,  LTD - HM DIABETES FOOT EXAM - PR LOW EXTEMITY NEUR EXAM DOCUM - PTH, intact and calcium  6. Vitamin D deficiency   7. Urinary tract infection , f/u  - Urinalysis, Routine w reflex microscopic - Urine Culture   8. BPH with obstruction/lower urinary tract symptoms   9. Screening for colorectal cancer  - POC Hemoccult Bld/Stl e   10. Screening for heart disease  - EKG 12-Lead   11. FH: heart disease  - EKG 12-Lead - [place], RETROPERITNL ABD,  LTD   12. Screening for AAA (aortic abdominal aneurysm)  - [place], RETROPERITNL ABD,  LTD - Urinalysis, Routine w reflex microscopic - Urine Culture   13. Medication management  - Urinalysis, Routine w reflex microscopic - Urine Culture - PSA - CBC with Differential/Platelet - COMPLETE METABOLIC PANEL WITH GFR - Magnesium - Lipid panel - TSH       Patient was counseled in prudent diet, weight control to achieve/maintain BMI less than 25, BP monitoring, regular exercise and medications as discussed.  Discussed med effects and SE's. Routine screening labs and tests as requested with regular follow-up as recommended. Over 40 minutes of exam, counseling, chart review and high complex critical decision making was performed   Marinus Maw, MD

## 2023-03-15 ENCOUNTER — Ambulatory Visit (HOSPITAL_COMMUNITY): Admit: 2023-03-15 | Payer: Medicare Other | Admitting: Urology

## 2023-03-15 ENCOUNTER — Ambulatory Visit (INDEPENDENT_AMBULATORY_CARE_PROVIDER_SITE_OTHER): Payer: Medicare Other | Admitting: Internal Medicine

## 2023-03-15 VITALS — BP 118/70 | HR 69 | Temp 97.9°F | Resp 16 | Ht 68.5 in | Wt 240.2 lb

## 2023-03-15 DIAGNOSIS — E785 Hyperlipidemia, unspecified: Secondary | ICD-10-CM | POA: Diagnosis not present

## 2023-03-15 DIAGNOSIS — E559 Vitamin D deficiency, unspecified: Secondary | ICD-10-CM

## 2023-03-15 DIAGNOSIS — Z0000 Encounter for general adult medical examination without abnormal findings: Secondary | ICD-10-CM | POA: Diagnosis not present

## 2023-03-15 DIAGNOSIS — N138 Other obstructive and reflux uropathy: Secondary | ICD-10-CM

## 2023-03-15 DIAGNOSIS — E1169 Type 2 diabetes mellitus with other specified complication: Secondary | ICD-10-CM | POA: Diagnosis not present

## 2023-03-15 DIAGNOSIS — Z79899 Other long term (current) drug therapy: Secondary | ICD-10-CM

## 2023-03-15 DIAGNOSIS — N1831 Chronic kidney disease, stage 3a: Secondary | ICD-10-CM

## 2023-03-15 DIAGNOSIS — I700 Atherosclerosis of aorta: Secondary | ICD-10-CM

## 2023-03-15 DIAGNOSIS — I10 Essential (primary) hypertension: Secondary | ICD-10-CM | POA: Diagnosis not present

## 2023-03-15 DIAGNOSIS — Z8249 Family history of ischemic heart disease and other diseases of the circulatory system: Secondary | ICD-10-CM

## 2023-03-15 DIAGNOSIS — N390 Urinary tract infection, site not specified: Secondary | ICD-10-CM

## 2023-03-15 DIAGNOSIS — E1122 Type 2 diabetes mellitus with diabetic chronic kidney disease: Secondary | ICD-10-CM

## 2023-03-15 DIAGNOSIS — Z136 Encounter for screening for cardiovascular disorders: Secondary | ICD-10-CM | POA: Diagnosis not present

## 2023-03-15 DIAGNOSIS — Z0001 Encounter for general adult medical examination with abnormal findings: Secondary | ICD-10-CM

## 2023-03-15 DIAGNOSIS — Z1211 Encounter for screening for malignant neoplasm of colon: Secondary | ICD-10-CM

## 2023-03-15 SURGERY — TURP (TRANSURETHRAL RESECTION OF PROSTATE)
Anesthesia: General

## 2023-03-16 LAB — URINALYSIS, ROUTINE W REFLEX MICROSCOPIC
Bilirubin Urine: NEGATIVE
Glucose, UA: NEGATIVE
Hgb urine dipstick: NEGATIVE
Ketones, ur: NEGATIVE
Leukocytes,Ua: NEGATIVE
Nitrite: NEGATIVE
Protein, ur: NEGATIVE
Specific Gravity, Urine: 1.024 (ref 1.001–1.035)
pH: <=5 (ref 5.0–8.0)

## 2023-03-16 LAB — COMPLETE METABOLIC PANEL WITH GFR
AG Ratio: 1.4 (calc) (ref 1.0–2.5)
ALT: 12 U/L (ref 9–46)
AST: 14 U/L (ref 10–35)
Albumin: 4.2 g/dL (ref 3.6–5.1)
Alkaline phosphatase (APISO): 65 U/L (ref 35–144)
BUN: 20 mg/dL (ref 7–25)
CO2: 31 mmol/L (ref 20–32)
Calcium: 10 mg/dL (ref 8.6–10.3)
Chloride: 105 mmol/L (ref 98–110)
Creat: 1.24 mg/dL (ref 0.70–1.35)
Globulin: 3.1 g/dL (ref 1.9–3.7)
Glucose, Bld: 118 mg/dL — ABNORMAL HIGH (ref 65–99)
Potassium: 4.2 mmol/L (ref 3.5–5.3)
Sodium: 142 mmol/L (ref 135–146)
Total Bilirubin: 0.8 mg/dL (ref 0.2–1.2)
Total Protein: 7.3 g/dL (ref 6.1–8.1)
eGFR: 64 mL/min/{1.73_m2} (ref >=60)

## 2023-03-16 LAB — CBC WITH DIFFERENTIAL/PLATELET
Absolute Lymphocytes: 2074 {cells}/uL (ref 850–3900)
Absolute Monocytes: 378 {cells}/uL (ref 200–950)
Basophils Absolute: 18 {cells}/uL (ref 0–200)
Basophils Relative: 0.3 %
Eosinophils Absolute: 177 {cells}/uL (ref 15–500)
Eosinophils Relative: 2.9 %
HCT: 38.2 % — ABNORMAL LOW (ref 38.5–50.0)
Hemoglobin: 12.2 g/dL — ABNORMAL LOW (ref 13.2–17.1)
MCH: 28.6 pg (ref 27.0–33.0)
MCHC: 31.9 g/dL — ABNORMAL LOW (ref 32.0–36.0)
MCV: 89.7 fL (ref 80.0–100.0)
MPV: 9.8 fL (ref 7.5–12.5)
Monocytes Relative: 6.2 %
Neutro Abs: 3453 {cells}/uL (ref 1500–7800)
Neutrophils Relative %: 56.6 %
Platelets: 222 10*3/uL (ref 140–400)
RBC: 4.26 10*6/uL (ref 4.20–5.80)
RDW: 13.4 % (ref 11.0–15.0)
Total Lymphocyte: 34 %
WBC: 6.1 10*3/uL (ref 3.8–10.8)

## 2023-03-16 LAB — URINE CULTURE
MICRO NUMBER:: 15851054
Result:: NO GROWTH
SPECIMEN QUALITY:: ADEQUATE

## 2023-03-16 LAB — LIPID PANEL
Cholesterol: 149 mg/dL (ref <200)
HDL: 44 mg/dL (ref >=40)
LDL Cholesterol (Calc): 84 mg/dL
Non-HDL Cholesterol (Calc): 105 mg/dL (ref <130)
Total CHOL/HDL Ratio: 3.4 (calc) (ref <5.0)
Triglycerides: 115 mg/dL (ref <150)

## 2023-03-16 LAB — TSH: TSH: 2.79 m[IU]/L (ref 0.40–4.50)

## 2023-03-16 LAB — PTH, INTACT AND CALCIUM
Calcium: 10 mg/dL (ref 8.6–10.3)
PTH: 31 pg/mL (ref 16–77)

## 2023-03-16 LAB — PSA: PSA: 4.58 ng/mL — ABNORMAL HIGH (ref <=4.00)

## 2023-03-16 LAB — MAGNESIUM: Magnesium: 2 mg/dL (ref 1.5–2.5)

## 2023-03-16 NOTE — Progress Notes (Signed)
[][][][][][][][][][][][][][][][][][][][][][][][][][][][][][][][][][][][][][][][][]][][][][][][][][][][][][][][][][][][][][][][][[][][][][]  -  Test results slightly outside the reference range are not unusual. If there is anything important, I will review this with you,  otherwise it is considered normal test values.  If you have further questions,  please do not hesitate to contact me at the office or via My Chart.   [] [] [] [] [] [] [] [] [] [] [] [] [] [] [] [] [] [] [] [] [] [] [] [] [] [] [] [] [] [] [] [] [] [] [] [] [] [] [] [] [] ][] [] [] [] [] [] [] [] [] [] [] [] [] [] [] [] [] [] [] [] [] [] [[] [] [] [] []   -  PSA still a little elevated - have sent results to Dr Annabell Howells  [] [] [] [] [] [] [] [] [] [] [] [] [] [] [] [] [] [] [] [] [] [] [] [] [] [] [] [] [] [] [] [] [] [] [] [] [] [] [] [] [] ][] [] [] [] [] [] [] [] [] [] [] [] [] [] [] [] [] [] [] [] [] [] [[] [] [] [] []   -  PTH -is Hormone that regulates calcium balance & is Normal   -  Calcium level also is normal   [] [] [] [] [] [] [] [] [] [] [] [] [] [] [] [] [] [] [] [] [] [] [] [] [] [] [] [] [] [] [] [] [] [] [] [] [] [] [] [] [] ][] [] [] [] [] [] [] [] [] [] [] [] [] [] [] [] [] [] [] [] [] [] [[] [] [] [] []   -  Chol = 149  &   LDL = 84   -   Both  Excellent   - Very low risk for Heart Attack  / Stroke  [] [] [] [] [] [] [] [] [] [] [] [] [] [] [] [] [] [] [] [] [] [] [] [] [] [] [] [] [] [] [] [] [] [] [] [] [] [] [] [] [] ][] [] [] [] [] [] [] [] [] [] [] [] [] [] [] [] [] [] [] [] [] [] [[] [] [] [] []   -  All Else - CBC - Kidneys - Electrolytes - Liver - Magnesium & Thyroid    - all  Normal / OK  [] [] [] [] [] [] [] [] [] [] [] [] [] [] [] [] [] [] [] [] [] [] [] [] [] [] [] [] [] [] [] [] [] [] [] [] [] [] [] [] [] ][] [] [] [] [] [] [] [] [] [] [] [] [] [] [] [] [] [] [] [] [] [] [[] [] [] [] []

## 2023-03-17 ENCOUNTER — Encounter: Payer: Self-pay | Admitting: Internal Medicine

## 2023-03-20 ENCOUNTER — Encounter: Payer: Self-pay | Admitting: Internal Medicine

## 2023-04-06 ENCOUNTER — Other Ambulatory Visit: Payer: Self-pay | Admitting: Nurse Practitioner

## 2023-04-17 DIAGNOSIS — R7303 Prediabetes: Secondary | ICD-10-CM | POA: Diagnosis not present

## 2023-04-17 DIAGNOSIS — M25561 Pain in right knee: Secondary | ICD-10-CM | POA: Diagnosis not present

## 2023-05-03 DIAGNOSIS — M1712 Unilateral primary osteoarthritis, left knee: Secondary | ICD-10-CM | POA: Diagnosis not present

## 2023-05-03 DIAGNOSIS — M21162 Varus deformity, not elsewhere classified, left knee: Secondary | ICD-10-CM | POA: Diagnosis not present

## 2023-05-03 DIAGNOSIS — M25762 Osteophyte, left knee: Secondary | ICD-10-CM | POA: Diagnosis not present

## 2023-05-03 DIAGNOSIS — G8918 Other acute postprocedural pain: Secondary | ICD-10-CM | POA: Diagnosis not present

## 2023-05-03 DIAGNOSIS — Z96652 Presence of left artificial knee joint: Secondary | ICD-10-CM | POA: Diagnosis not present

## 2023-05-06 DIAGNOSIS — Z96652 Presence of left artificial knee joint: Secondary | ICD-10-CM | POA: Diagnosis not present

## 2023-05-06 DIAGNOSIS — M25662 Stiffness of left knee, not elsewhere classified: Secondary | ICD-10-CM | POA: Diagnosis not present

## 2023-05-08 DIAGNOSIS — M25662 Stiffness of left knee, not elsewhere classified: Secondary | ICD-10-CM | POA: Diagnosis not present

## 2023-05-08 DIAGNOSIS — Z96652 Presence of left artificial knee joint: Secondary | ICD-10-CM | POA: Diagnosis not present

## 2023-05-10 DIAGNOSIS — Z96652 Presence of left artificial knee joint: Secondary | ICD-10-CM | POA: Diagnosis not present

## 2023-05-10 DIAGNOSIS — M25662 Stiffness of left knee, not elsewhere classified: Secondary | ICD-10-CM | POA: Diagnosis not present

## 2023-05-13 DIAGNOSIS — M25662 Stiffness of left knee, not elsewhere classified: Secondary | ICD-10-CM | POA: Diagnosis not present

## 2023-05-13 DIAGNOSIS — Z96652 Presence of left artificial knee joint: Secondary | ICD-10-CM | POA: Diagnosis not present

## 2023-05-14 DIAGNOSIS — M25662 Stiffness of left knee, not elsewhere classified: Secondary | ICD-10-CM | POA: Diagnosis not present

## 2023-05-15 DIAGNOSIS — M25662 Stiffness of left knee, not elsewhere classified: Secondary | ICD-10-CM | POA: Diagnosis not present

## 2023-05-15 DIAGNOSIS — Z96652 Presence of left artificial knee joint: Secondary | ICD-10-CM | POA: Diagnosis not present

## 2023-05-17 DIAGNOSIS — Z96652 Presence of left artificial knee joint: Secondary | ICD-10-CM | POA: Diagnosis not present

## 2023-05-17 DIAGNOSIS — M25662 Stiffness of left knee, not elsewhere classified: Secondary | ICD-10-CM | POA: Diagnosis not present

## 2023-05-21 DIAGNOSIS — M25662 Stiffness of left knee, not elsewhere classified: Secondary | ICD-10-CM | POA: Diagnosis not present

## 2023-05-21 DIAGNOSIS — Z96652 Presence of left artificial knee joint: Secondary | ICD-10-CM | POA: Diagnosis not present

## 2023-05-24 DIAGNOSIS — Z96652 Presence of left artificial knee joint: Secondary | ICD-10-CM | POA: Diagnosis not present

## 2023-05-24 DIAGNOSIS — M25662 Stiffness of left knee, not elsewhere classified: Secondary | ICD-10-CM | POA: Diagnosis not present

## 2023-05-27 DIAGNOSIS — R3912 Poor urinary stream: Secondary | ICD-10-CM | POA: Diagnosis not present

## 2023-05-28 DIAGNOSIS — Z96652 Presence of left artificial knee joint: Secondary | ICD-10-CM | POA: Diagnosis not present

## 2023-05-28 DIAGNOSIS — M25662 Stiffness of left knee, not elsewhere classified: Secondary | ICD-10-CM | POA: Diagnosis not present

## 2023-05-30 DIAGNOSIS — M25662 Stiffness of left knee, not elsewhere classified: Secondary | ICD-10-CM | POA: Diagnosis not present

## 2023-05-30 DIAGNOSIS — Z96652 Presence of left artificial knee joint: Secondary | ICD-10-CM | POA: Diagnosis not present

## 2023-06-03 ENCOUNTER — Other Ambulatory Visit: Payer: Self-pay

## 2023-06-03 MED ORDER — PHENTERMINE HCL 37.5 MG PO TABS: ORAL_TABLET | ORAL | 0 refills | Status: DC

## 2023-06-11 ENCOUNTER — Ambulatory Visit: Payer: Medicare Other | Admitting: Nurse Practitioner

## 2023-06-20 ENCOUNTER — Other Ambulatory Visit: Payer: Self-pay

## 2023-06-20 DIAGNOSIS — I10 Essential (primary) hypertension: Secondary | ICD-10-CM

## 2023-06-20 MED ORDER — BISOPROLOL-HYDROCHLOROTHIAZIDE 10-6.25 MG PO TABS: ORAL_TABLET | ORAL | 0 refills | Status: DC

## 2023-08-06 ENCOUNTER — Ambulatory Visit: Payer: Medicare Other | Admitting: Family Medicine

## 2023-08-06 ENCOUNTER — Encounter: Payer: Self-pay | Admitting: Family Medicine

## 2023-08-06 VITALS — BP 124/82 | HR 85 | Temp 97.6°F | Ht 68.5 in | Wt 248.0 lb

## 2023-08-06 DIAGNOSIS — E1169 Type 2 diabetes mellitus with other specified complication: Secondary | ICD-10-CM

## 2023-08-06 DIAGNOSIS — K921 Melena: Secondary | ICD-10-CM | POA: Diagnosis not present

## 2023-08-06 DIAGNOSIS — N1831 Chronic kidney disease, stage 3a: Secondary | ICD-10-CM | POA: Diagnosis not present

## 2023-08-06 DIAGNOSIS — R7989 Other specified abnormal findings of blood chemistry: Secondary | ICD-10-CM | POA: Diagnosis not present

## 2023-08-06 DIAGNOSIS — I10 Essential (primary) hypertension: Secondary | ICD-10-CM

## 2023-08-06 DIAGNOSIS — Z6837 Body mass index (BMI) 37.0-37.9, adult: Secondary | ICD-10-CM | POA: Diagnosis not present

## 2023-08-06 DIAGNOSIS — R972 Elevated prostate specific antigen [PSA]: Secondary | ICD-10-CM

## 2023-08-06 DIAGNOSIS — Z2821 Immunization not carried out because of patient refusal: Secondary | ICD-10-CM

## 2023-08-06 DIAGNOSIS — E669 Obesity, unspecified: Secondary | ICD-10-CM | POA: Diagnosis not present

## 2023-08-06 DIAGNOSIS — E785 Hyperlipidemia, unspecified: Secondary | ICD-10-CM

## 2023-08-06 DIAGNOSIS — N400 Enlarged prostate without lower urinary tract symptoms: Secondary | ICD-10-CM

## 2023-08-06 DIAGNOSIS — E1122 Type 2 diabetes mellitus with diabetic chronic kidney disease: Secondary | ICD-10-CM | POA: Diagnosis not present

## 2023-08-06 LAB — CBC WITH DIFFERENTIAL/PLATELET
Basophils Absolute: 0 10*3/uL (ref 0.0–0.1)
Basophils Relative: 0.8 % (ref 0.0–3.0)
Eosinophils Absolute: 0.2 10*3/uL (ref 0.0–0.7)
Eosinophils Relative: 4.4 % (ref 0.0–5.0)
HCT: 36.4 % — ABNORMAL LOW (ref 39.0–52.0)
Hemoglobin: 11.9 g/dL — ABNORMAL LOW (ref 13.0–17.0)
Lymphocytes Relative: 35.3 % (ref 12.0–46.0)
Lymphs Abs: 1.8 10*3/uL (ref 0.7–4.0)
MCHC: 32.6 g/dL (ref 30.0–36.0)
MCV: 87.1 fl (ref 78.0–100.0)
Monocytes Absolute: 0.4 10*3/uL (ref 0.1–1.0)
Monocytes Relative: 8.6 % (ref 3.0–12.0)
Neutro Abs: 2.5 10*3/uL (ref 1.4–7.7)
Neutrophils Relative %: 50.9 % (ref 43.0–77.0)
Platelets: 210 10*3/uL (ref 150.0–400.0)
RBC: 4.18 Mil/uL — ABNORMAL LOW (ref 4.22–5.81)
RDW: 15.3 % (ref 11.5–15.5)
WBC: 5 10*3/uL (ref 4.0–10.5)

## 2023-08-06 LAB — URINALYSIS, ROUTINE W REFLEX MICROSCOPIC
Bilirubin Urine: NEGATIVE
Ketones, ur: NEGATIVE
Leukocytes,Ua: NEGATIVE
Nitrite: NEGATIVE
RBC / HPF: NONE SEEN (ref 0–?)
Specific Gravity, Urine: >=1.03 — AB (ref 1.000–1.030)
Total Protein, Urine: NEGATIVE
Urine Glucose: NEGATIVE
Urobilinogen, UA: 0.2 (ref 0.0–1.0)
pH: 5.5 (ref 5.0–8.0)

## 2023-08-06 LAB — T4, FREE: Free T4: 0.75 ng/dL (ref 0.60–1.60)

## 2023-08-06 LAB — COMPREHENSIVE METABOLIC PANEL WITH GFR
ALT: 13 U/L (ref 0–53)
AST: 15 U/L (ref 0–37)
Albumin: 4.1 g/dL (ref 3.5–5.2)
Alkaline Phosphatase: 62 U/L (ref 39–117)
BUN: 20 mg/dL (ref 6–23)
CO2: 28 meq/L (ref 19–32)
Calcium: 9.5 mg/dL (ref 8.4–10.5)
Chloride: 103 meq/L (ref 96–112)
Creatinine, Ser: 1.37 mg/dL (ref 0.40–1.50)
GFR: 53.29 mL/min — ABNORMAL LOW (ref >60.00)
Glucose, Bld: 117 mg/dL — ABNORMAL HIGH (ref 70–99)
Potassium: 4.3 meq/L (ref 3.5–5.1)
Sodium: 138 meq/L (ref 135–145)
Total Bilirubin: 0.9 mg/dL (ref 0.2–1.2)
Total Protein: 7.2 g/dL (ref 6.0–8.3)

## 2023-08-06 LAB — MICROALBUMIN / CREATININE URINE RATIO
Creatinine,U: 158.6 mg/dL
Microalb Creat Ratio: 13.3 mg/g (ref 0.0–30.0)
Microalb, Ur: 2.1 mg/dL — ABNORMAL HIGH (ref 0.0–1.9)

## 2023-08-06 LAB — PSA: PSA: 2.72 ng/mL (ref 0.10–4.00)

## 2023-08-06 LAB — TSH: TSH: 2.71 u[IU]/mL (ref 0.35–5.50)

## 2023-08-06 LAB — HEMOGLOBIN A1C: Hgb A1c MFr Bld: 6.4 % (ref 4.6–6.5)

## 2023-08-06 LAB — VITAMIN D 25 HYDROXY (VIT D DEFICIENCY, FRACTURES): VITD: 61.51 ng/mL (ref 30.00–100.00)

## 2023-08-06 NOTE — Assessment & Plan Note (Signed)
 Check vitamin D  level and adjust dose as warranted.

## 2023-08-06 NOTE — Assessment & Plan Note (Signed)
 A1c in prediabetes range consistently. Check A1c and renal function.

## 2023-08-06 NOTE — Progress Notes (Signed)
 New Patient Office Visit  Subjective    Patient ID: Gabriel Livingston, male    DOB: Jan 18, 1956  Age: 68 y.o. MRN: [id number]  CC:  Chief Complaint  Patient presents with   Establish Care    Left shoulder pain that comes and goes for maybe 4 months. Not sure if arthritis.  Blood in stool, thinks may be due to hemorrhoids. Last noticed yesterday (happens when he has hard stools). No problems w soft stools    HPI Gabriel Livingston presents to establish care "[name]"  Previous PCP - Dr. Cassondra Cliff   Urologist- Dr. Ranae Burrow for BPH and elevated PSA  GI- Dr. Howard Macho   HTN- Ziac  and doxazosin    HLD- on rosuvastatin    DM- last A1c 6.3%  Taking metformin    Eye exam- [facility] on [address]   Vitamin D  - 95 in 01/2023  C/o pink blood with hard stools only. Thinks he has hemorrhoids.   Last colonoscopy 2020 and 1 polyp   Phentermine  for the past 4 months      Outpatient Encounter Medications as of 08/06/2023  Medication Sig   bisoprolol -hydrochlorothiazide  (ZIAC ) 10-6.25 MG tablet TAKE 1 TABLET BY MOUTH DAILY FOR BLOOD PRESSURE   doxazosin  (CARDURA ) 8 MG tablet Take  1 tablet  at Bedtime  for BP & Prostate                                                              /                                                                   TAKE                                         BY                                                 MOUTH   Ferrous Sulfate (IRON PO) Take 65 mg by mouth daily.   metFORMIN  (GLUCOPHAGE -XR) 500 MG 24 hr tablet Take 1 to 2 tablets 2 x /day with Meals for Diabetes.   rosuvastatin  (CRESTOR ) 40 MG tablet Take  1 tablet  Daily   for Cholesterol                                                                         /  TAKE                                                    BY                                                   MOUTH (Patient taking differently: Take  1 tablet  Daily   for Cholesterol)   zinc gluconate 50  MG tablet Take 50 mg by mouth daily.   [DISCONTINUED] phentermine  (ADIPEX-P ) 37.5 MG tablet Take 1/2 to 1 tablet every Morning for Dieting & Weight Loss   (Dx: e66.01)   tadalafil  (CIALIS ) 20 MG tablet Take 1/2 to 1 tablet every 2 to 3 days as needed for XXXX (Patient not taking: Reported on 08/06/2023)   [DISCONTINUED] meloxicam  (MOBIC ) 15 MG tablet Take  1/2 to 1 tablet  Daily  with Food for Pain & Inflammation & try limit to 5 tablets /week to Avoid Kidney Damage (Patient not taking: Reported on 08/06/2023)   [DISCONTINUED] oxybutynin  (DITROPAN ) 5 MG tablet Take  1 tablet 3 x /day for Bladder Control                                                                                    /                                                    TAKE                                      BY                                            MOUTH (Patient not taking: Reported on 08/06/2023)   No facility-administered encounter medications on file as of 08/06/2023.    Past Medical History:  Diagnosis Date   Anemia    ED (erectile dysfunction)    Enlarged prostate    Hyperlipidemia    Hypertension    Hypogonadism male    Other abnormal glucose    Overactive bladder    Sleep apnea    mild, no cpap   Unspecified vitamin D  deficiency     Past Surgical History:  Procedure Laterality Date   COLONOSCOPY  2015   hx polyp   JOINT REPLACEMENT     POLYPECTOMY     colon polyps    Family History  Problem Relation Age of Onset   Heart disease Mother    Hyperlipidemia Mother    Hypertension Mother  Cancer Father        lung, prostate   Cancer Sister    Colon cancer Neg Hx    Rectal cancer Neg Hx    Stomach cancer Neg Hx    Esophageal cancer Neg Hx     Social History   Socioeconomic History   Marital status: Married    Spouse name: Not on file   Number of children: Not on file   Years of education: Not on file   Highest education level: Associate degree: occupational, Scientist, product/process development, or vocational program   Occupational History   Not on file  Tobacco Use   Smoking status: Never   Smokeless tobacco: Never  Vaping Use   Vaping status: Never Used  Substance and Sexual Activity   Alcohol use: No   Drug use: No   Sexual activity: Not Currently  Other Topics Concern   Not on file  Social History Narrative   Not on file   Social Drivers of Health   Financial Resource Strain: Low Risk  (08/05/2023)   Overall Financial Resource Strain (CARDIA)    Difficulty of Paying Living Expenses: Not hard at all  Food Insecurity: No Food Insecurity (08/05/2023)   Hunger Vital Sign    Worried About Running Out of Food in the Last Year: Never true    Ran Out of Food in the Last Year: Never true  Transportation Needs: No Transportation Needs (08/05/2023)   PRAPARE - Administrator, Civil Service (Medical): No    Lack of Transportation (Non-Medical): No  Physical Activity: Unknown (08/05/2023)   Exercise Vital Sign    Days of Exercise per Week: 0 days    Minutes of Exercise per Session: Not on file  Stress: No Stress Concern Present (08/05/2023)   [employer] of Occupational Health - Occupational Stress Questionnaire    Feeling of Stress : Not at all  Social Connections: Moderately Integrated (08/05/2023)   Social Connection and Isolation Panel [NHANES]    Frequency of Communication with Friends and Family: More than three times a week    Frequency of Social Gatherings with Friends and Family: Once a week    Attends Religious Services: More than 4 times per year    Active Member of [employer] or Organizations: No    Attends Engineer, structural: Not on file    Marital Status: Married  Catering manager Violence: Not on file    Review of Systems  Constitutional:  Negative for chills, fever, malaise/fatigue and weight loss.  Respiratory:  Negative for cough and shortness of breath.   Cardiovascular:  Negative for chest pain, palpitations and leg swelling.  Gastrointestinal:  Positive  for blood in stool and constipation. Negative for abdominal pain, diarrhea, nausea and vomiting.       Occasional hard stools with scant amount of blood on tissue   Genitourinary:  Negative for dysuria, frequency and urgency.  Neurological:  Negative for dizziness, focal weakness and headaches.  Endo/Heme/Allergies:  Negative for polydipsia.  Psychiatric/Behavioral:  Negative for depression and substance abuse. The patient is not nervous/anxious.         Objective    BP 124/82 (BP Location: Left Arm, Patient Position: Sitting)   Pulse 85   Temp 97.6 F (36.4 C) (Temporal)   Ht 5' 8.5" (1.74 m)   Wt 248 lb (112.5 kg)   SpO2 99%   BMI 37.16 kg/m   Physical Exam Constitutional:      General: He is not in  acute distress.    Appearance: He is not ill-appearing.  HENT:     Mouth/Throat:     Mouth: Mucous membranes are moist.  Eyes:     Extraocular Movements: Extraocular movements intact.     Conjunctiva/sclera: Conjunctivae normal.  Cardiovascular:     Rate and Rhythm: Normal rate and regular rhythm.  Pulmonary:     Effort: Pulmonary effort is normal.     Breath sounds: Normal breath sounds.  Musculoskeletal:     Cervical back: Normal range of motion and neck supple.     Right lower leg: No edema.     Left lower leg: No edema.  Skin:    General: Skin is warm and dry.  Neurological:     General: No focal deficit present.     Mental Status: He is alert and oriented to person, place, and time.     Motor: No weakness.     Coordination: Coordination normal.     Gait: Gait normal.  Psychiatric:        Mood and Affect: Mood normal.        Behavior: Behavior normal.        Thought Content: Thought content normal.         Assessment & Plan:   Problem List Items Addressed This Visit     Blood in the stool   Discussed keeping stools soft and if he still sees blood in his stool, he will need to follow up with GI. Fecal occult blood test ordered.       Relevant Orders    Fecal occult blood, imunochemical   BPH with elevated PSA, without lower urinary tract symptoms   Relevant Orders   Urinalysis, Routine w reflex microscopic (Completed)   Elevated PSA = 4.21 Mar 2022   Check PSA and he will follow up with urology as recommended.       Relevant Orders   PSA (Completed)   Urinalysis, Routine w reflex microscopic (Completed)   Essential hypertension - Primary   BP controlled. Continue current medications and low sodium diet. Check renal function.       Relevant Orders   CBC with Differential/Platelet (Completed)   Comprehensive metabolic panel with GFR (Completed)   TSH (Completed)   T4, free (Completed)   High serum vitamin D    Check vitamin D  level and adjust dose as warranted.       Relevant Orders   VITAMIN D  25 Hydroxy (Vit-D Deficiency, Fractures) (Completed)   Hyperlipidemia associated with type 2 diabetes mellitus (HCC)   Continue statin therapy. Low fat diet. Increase physical activity. Check fasting lipids at follow up visit.       Obesity (BMI 30-39.9)   Work on diet and exercise.       Relevant Orders   CBC with Differential/Platelet (Completed)   Comprehensive metabolic panel with GFR (Completed)   Hemoglobin A1c (Completed)   TSH (Completed)   T4, free (Completed)   Type 2 diabetes mellitus with stage 3a chronic kidney disease, without long-term current use of insulin  (HCC)   A1c in prediabetes range consistently. Check A1c and renal function.       Relevant Orders   CBC with Differential/Platelet (Completed)   Comprehensive metabolic panel with GFR (Completed)   Hemoglobin A1c (Completed)   TSH (Completed)   T4, free (Completed)   Microalbumin / creatinine urine ratio (Completed)   Other Visit Diagnoses       Immunization declined  Return in about 3 months (around 11/06/2023) for Fasting follow up.   Alyson Back, NP-C

## 2023-08-06 NOTE — Assessment & Plan Note (Signed)
 BP controlled. Continue current medications and low sodium diet. Check renal function.

## 2023-08-06 NOTE — Patient Instructions (Addendum)
 Thank you for trusting us  with your health care.  Please go downstairs for labs and a urine test before you leave.  Please return the stool kit as instructed.  Keep your stools soft.  You can take stool softeners and you can also take over-the-counter MiraLAX daily.  I will be in touch with your results and with recommendations.

## 2023-08-06 NOTE — Assessment & Plan Note (Signed)
 Work on diet and exercise

## 2023-08-06 NOTE — Assessment & Plan Note (Signed)
 Continue statin therapy. Low fat diet. Increase physical activity. Check fasting lipids at follow up visit.

## 2023-08-06 NOTE — Assessment & Plan Note (Signed)
 Check PSA and he will follow up with urology as recommended.

## 2023-08-06 NOTE — Assessment & Plan Note (Signed)
 Discussed keeping stools soft and if he still sees blood in his stool, he will need to follow up with GI. Fecal occult blood test ordered.

## 2023-08-07 ENCOUNTER — Encounter: Payer: Self-pay | Admitting: Family Medicine

## 2023-08-07 DIAGNOSIS — N183 Chronic kidney disease, stage 3 unspecified: Secondary | ICD-10-CM | POA: Insufficient documentation

## 2023-08-07 NOTE — Progress Notes (Signed)
 Did he turn in the stool study yet? Remind him to do so, especially since he has mild anemia.

## 2023-08-07 NOTE — Progress Notes (Signed)
 Please see if he has any questions regarding my comments.  "Your labs show that you have very mild anemia which is not new. Your blood sugars are still well controlled in prediabetes range. You have chronic kidney disease per your labs, this is not new.  Your prostate blood test is normal now but as discussed, it is a good idea to see your urologist yearly for your prostate health.  Your labs are fine otherwise."

## 2023-08-09 ENCOUNTER — Other Ambulatory Visit: Payer: Self-pay | Admitting: Family Medicine

## 2023-08-09 DIAGNOSIS — K921 Melena: Secondary | ICD-10-CM

## 2023-08-09 NOTE — Progress Notes (Signed)
   Subjective:    Patient ID: Gabriel Livingston, male    DOB: 03-01-1956, 68 y.o.   MRN: [id number]  HPI    Review of Systems     Objective:   Physical Exam        Assessment & Plan:

## 2023-08-14 ENCOUNTER — Ambulatory Visit (INDEPENDENT_AMBULATORY_CARE_PROVIDER_SITE_OTHER)

## 2023-08-14 ENCOUNTER — Other Ambulatory Visit: Payer: Self-pay | Admitting: Family Medicine

## 2023-08-14 ENCOUNTER — Ambulatory Visit: Payer: Self-pay | Admitting: Family Medicine

## 2023-08-14 ENCOUNTER — Telehealth: Payer: Self-pay | Admitting: Family Medicine

## 2023-08-14 DIAGNOSIS — K921 Melena: Secondary | ICD-10-CM

## 2023-08-14 DIAGNOSIS — R350 Frequency of micturition: Secondary | ICD-10-CM

## 2023-08-14 DIAGNOSIS — R195 Other fecal abnormalities: Secondary | ICD-10-CM | POA: Insufficient documentation

## 2023-08-14 LAB — FECAL OCCULT BLOOD, IMMUNOCHEMICAL: Fecal Occult Bld: POSITIVE — AB

## 2023-08-14 MED ORDER — DOXAZOSIN MESYLATE 8 MG PO TABS: ORAL_TABLET | ORAL | 1 refills | Status: DC

## 2023-08-14 NOTE — Progress Notes (Signed)
 Please let him know that his stool test is positive for blood.  I have placed a referral to [facility] gastroenterology.  If he sees any significant blood in his stool, has dizziness, chest pain or shortness of breath, he should follow-up or go to the emergency department.

## 2023-08-14 NOTE — Telephone Encounter (Signed)
 Copied from CRM [id number]. Topic: Clinical - Medication Refill >> Aug 14, 2023  9:19 AM Tanazia G wrote: Medication: doxazosin  (CARDURA ) 8 MG tablet  Has the patient contacted their pharmacy? Yes (Agent: If no, request that the patient contact the pharmacy for the refill. If patient does not wish to contact the pharmacy document the reason why and proceed with request.) (Agent: If yes, when and what did the pharmacy advise?)  This is the patient's preferred pharmacy:  [facility]  Is this the correct pharmacy for this prescription? Yes If no, delete pharmacy and type the correct one.   Has the prescription been filled recently? Yes  Is the patient out of the medication? Yes  Has the patient been seen for an appointment in the last year OR does the patient have an upcoming appointment? Yes  Can we respond through MyChart? Yes  Agent: Please be advised that Rx refills may take up to 3 business days. We ask that you follow-up with your pharmacy.

## 2023-08-29 ENCOUNTER — Ambulatory Visit: Payer: Self-pay | Admitting: Gastroenterology

## 2023-08-29 ENCOUNTER — Ambulatory Visit: Admitting: Gastroenterology

## 2023-08-29 ENCOUNTER — Encounter: Payer: Self-pay | Admitting: Gastroenterology

## 2023-08-29 ENCOUNTER — Other Ambulatory Visit (INDEPENDENT_AMBULATORY_CARE_PROVIDER_SITE_OTHER)

## 2023-08-29 VITALS — BP 116/86 | HR 70 | Ht 68.5 in | Wt 249.2 lb

## 2023-08-29 DIAGNOSIS — K649 Unspecified hemorrhoids: Secondary | ICD-10-CM

## 2023-08-29 DIAGNOSIS — Z860101 Personal history of adenomatous and serrated colon polyps: Secondary | ICD-10-CM

## 2023-08-29 DIAGNOSIS — K5900 Constipation, unspecified: Secondary | ICD-10-CM

## 2023-08-29 DIAGNOSIS — N1831 Chronic kidney disease, stage 3a: Secondary | ICD-10-CM

## 2023-08-29 DIAGNOSIS — D509 Iron deficiency anemia, unspecified: Secondary | ICD-10-CM

## 2023-08-29 DIAGNOSIS — K625 Hemorrhage of anus and rectum: Secondary | ICD-10-CM

## 2023-08-29 DIAGNOSIS — R195 Other fecal abnormalities: Secondary | ICD-10-CM

## 2023-08-29 DIAGNOSIS — E1122 Type 2 diabetes mellitus with diabetic chronic kidney disease: Secondary | ICD-10-CM

## 2023-08-29 LAB — COMPREHENSIVE METABOLIC PANEL WITH GFR
ALT: 13 U/L (ref 0–53)
AST: 18 U/L (ref 0–37)
Albumin: 4.1 g/dL (ref 3.5–5.2)
Alkaline Phosphatase: 53 U/L (ref 39–117)
BUN: 21 mg/dL (ref 6–23)
CO2: 29 meq/L (ref 19–32)
Calcium: 9.4 mg/dL (ref 8.4–10.5)
Chloride: 105 meq/L (ref 96–112)
Creatinine, Ser: 1.4 mg/dL (ref 0.40–1.50)
GFR: 51.9 mL/min — ABNORMAL LOW (ref >60.00)
Glucose, Bld: 92 mg/dL (ref 70–99)
Potassium: 3.9 meq/L (ref 3.5–5.1)
Sodium: 141 meq/L (ref 135–145)
Total Bilirubin: 0.7 mg/dL (ref 0.2–1.2)
Total Protein: 7.3 g/dL (ref 6.0–8.3)

## 2023-08-29 LAB — FOLATE: Folate: 16.8 ng/mL (ref >5.9)

## 2023-08-29 LAB — IBC + FERRITIN
Ferritin: 111 ng/mL (ref 22.0–322.0)
Iron: 48 ug/dL (ref 42–165)
Saturation Ratios: 16.1 % — ABNORMAL LOW (ref 20.0–50.0)
TIBC: 298.2 ug/dL (ref 250.0–450.0)
Transferrin: 213 mg/dL (ref 212.0–360.0)

## 2023-08-29 LAB — CBC WITH DIFFERENTIAL/PLATELET
Basophils Absolute: 0 10*3/uL (ref 0.0–0.1)
Basophils Relative: 0.6 % (ref 0.0–3.0)
Eosinophils Absolute: 0.2 10*3/uL (ref 0.0–0.7)
Eosinophils Relative: 3.8 % (ref 0.0–5.0)
HCT: 36.1 % — ABNORMAL LOW (ref 39.0–52.0)
Hemoglobin: 11.9 g/dL — ABNORMAL LOW (ref 13.0–17.0)
Lymphocytes Relative: 35 % (ref 12.0–46.0)
Lymphs Abs: 1.9 10*3/uL (ref 0.7–4.0)
MCHC: 32.8 g/dL (ref 30.0–36.0)
MCV: 85.7 fl (ref 78.0–100.0)
Monocytes Absolute: 0.4 10*3/uL (ref 0.1–1.0)
Monocytes Relative: 7.7 % (ref 3.0–12.0)
Neutro Abs: 2.9 10*3/uL (ref 1.4–7.7)
Neutrophils Relative %: 52.9 % (ref 43.0–77.0)
Platelets: 212 10*3/uL (ref 150.0–400.0)
RBC: 4.22 Mil/uL (ref 4.22–5.81)
RDW: 15.3 % (ref 11.5–15.5)
WBC: 5.5 10*3/uL (ref 4.0–10.5)

## 2023-08-29 LAB — VITAMIN B12: Vitamin B-12: 412 pg/mL (ref 211–911)

## 2023-08-29 MED ORDER — NA SULFATE-K SULFATE-MG SULF 17.5-3.13-1.6 GM/177ML PO SOLN: ORAL | 0 refills | Status: DC

## 2023-08-29 NOTE — Patient Instructions (Addendum)
 Your provider has requested that you go to the basement level for lab work before leaving today. Press "B" on the elevator. The lab is located at the first door on the left as you exit the elevator.   You have been scheduled for an endoscopy and colonoscopy. Please follow the written instructions given to you at your visit today.  If you use inhalers (even only as needed), please bring them with you on the day of your procedure.  DO NOT TAKE 7 DAYS PRIOR TO TEST- Trulicity (dulaglutide) Ozempic, Wegovy (semaglutide) Mounjaro  (tirzepatide ) Bydureon Bcise (exanatide extended release)  DO NOT TAKE 1 DAY PRIOR TO YOUR TEST Rybelsus (semaglutide) Adlyxin (lixisenatide) Victoza (liraglutide) Byetta (exanatide)  We have sent the following medications to your pharmacy for you to pick up at your convenience: Suprep   A high fiber diet with plenty of fluids (up to 8 glasses of water daily) is suggested to relieve these symptoms.  Metamucil, 1 tablespoon once or twice daily can be used to keep bowels regular if needed.   _______________________________________________________  If your blood pressure at your visit was 140/90 or greater, please contact your primary care physician to follow up on this.  _______________________________________________________  If you are age 57 or older, your body mass index should be between 23-30. Your Body mass index is 37.35 kg/m. If this is out of the aforementioned range listed, please consider follow up with your Primary Care Provider.  If you are age 69 or younger, your body mass index should be between 19-25. Your Body mass index is 37.35 kg/m. If this is out of the aformentioned range listed, please consider follow up with your Primary Care Provider.   ________________________________________________________  The [facility] providers would like to encourage you to use MYCHART to communicate with providers for non-urgent requests or questions.  Due to  long hold times on the telephone, sending your provider a message by [facility] may be a faster and more efficient way to get a response.  Please allow 48 business hours for a response.  Please remember that this is for non-urgent requests.  _______________________________________________________  Due to recent changes in healthcare laws, you may see the results of your imaging and laboratory studies on MyChart before your provider has had a chance to review them.  We understand that in some cases there may be results that are confusing or concerning to you. Not all laboratory results come back in the same time frame and the provider may be waiting for multiple results in order to interpret others.  Please give us  48 hours in order for your provider to thoroughly review all the results before contacting the office for clarification of your results.   Thank you for entrusting me with your care and choosing [facility].  Bayley Luan Rumpf, PA-C

## 2023-08-29 NOTE — Progress Notes (Signed)
 Chief Complaint: Positive fecal occult Primary GI MD: Dr. Howard Macho  HPI: Discussed the use of AI scribe software for clinical note transcription with the patient, who gave verbal consent to proceed.  History of Present Illness Gabriel Livingston is a 68 year old male who presents with a positive fecal occult blood test. He was referred by his primary care physician for evaluation of the positive fecal occult blood test.  He has a positive fecal occult blood test, indicating the presence of blood in his stool. He occasionally observes blood in his stool, particularly with hard stools, but not with soft stools. No melena. No abdominal pain, nausea, vomiting, or changes in bowel habits, except for increased bowel movements after consuming chocolate ice cream.  He has a history of mild intermittent anemia over the past year, with the most recent hemoglobin level recorded at 11.9 g/dL, down from 40.9 g/dL. He has been taking iron supplements daily for approximately two years.  His last colonoscopy was in 2020, which revealed a small polyp. He has not experienced any heart attacks, strokes, or seizures and is not on any anticoagulants. He has undergone an MRI of the prostate in the past.   Past Medical History:  Diagnosis Date   Anemia    CKD (chronic kidney disease) stage 3, GFR 30-59 ml/min (HCC)    ED (erectile dysfunction)    Enlarged prostate    Hyperlipidemia    Hypertension    Hypogonadism male    Other abnormal glucose    Overactive bladder    Pre-diabetes    Sleep apnea    mild, no cpap   Unspecified vitamin D  deficiency     Past Surgical History:  Procedure Laterality Date   COLONOSCOPY  2015   hx polyp   JOINT REPLACEMENT     POLYPECTOMY     colon polyps    Current Outpatient Medications  Medication Sig Dispense Refill   bisoprolol -hydrochlorothiazide  (ZIAC ) 10-6.25 MG tablet TAKE 1 TABLET BY MOUTH DAILY FOR BLOOD PRESSURE 100 tablet 0   doxazosin  (CARDURA ) 8 MG  tablet Take 1 tablet daily at Bedtime 90 tablet 1   Ferrous Sulfate (IRON PO) Take 65 mg by mouth daily.     metFORMIN  (GLUCOPHAGE -XR) 500 MG 24 hr tablet Take 1 to 2 tablets 2 x /day with Meals for Diabetes. 360 tablet 3   Na Sulfate-K Sulfate-Mg Sulfate concentrate (SUPREP) 17.5-3.13-1.6 GM/177ML SOLN Use as directed; may use generic; goodrx card if insurance will not cover generic 354 mL 0   rosuvastatin  (CRESTOR ) 40 MG tablet Take  1 tablet  Daily   for Cholesterol                                                                         /                                                   TAKE  BY                                                   MOUTH (Patient taking differently: Take  1 tablet  Daily   for Cholesterol) 90 tablet 3   tadalafil  (CIALIS ) 20 MG tablet Take 1/2 to 1 tablet every 2 to 3 days as needed for XXXX 30 tablet 3   zinc gluconate 50 MG tablet Take 50 mg by mouth daily.     No current facility-administered medications for this visit.    Allergies as of 08/29/2023   (No Known Allergies)    Family History  Problem Relation Age of Onset   Heart disease Mother    Hyperlipidemia Mother    Hypertension Mother    Cancer Father        lung, prostate   Cancer Sister    Breast cancer Sister    Colon cancer Neg Hx    Rectal cancer Neg Hx    Stomach cancer Neg Hx    Esophageal cancer Neg Hx     Social History   Socioeconomic History   Marital status: Married    Spouse name: Not on file   Number of children: Not on file   Years of education: Not on file   Highest education level: Associate degree: occupational, Scientist, product/process development, or vocational program  Occupational History   Not on file  Tobacco Use   Smoking status: Never   Smokeless tobacco: Never  Vaping Use   Vaping status: Never Used  Substance and Sexual Activity   Alcohol use: No   Drug use: No   Sexual activity: Not Currently  Other Topics Concern   Not on  file  Social History Narrative   Not on file   Social Drivers of Health   Financial Resource Strain: Low Risk  (08/05/2023)   Overall Financial Resource Strain (CARDIA)    Difficulty of Paying Living Expenses: Not hard at all  Food Insecurity: No Food Insecurity (08/05/2023)   Hunger Vital Sign    Worried About Running Out of Food in the Last Year: Never true    Ran Out of Food in the Last Year: Never true  Transportation Needs: No Transportation Needs (08/05/2023)   PRAPARE - Administrator, Civil Service (Medical): No    Lack of Transportation (Non-Medical): No  Physical Activity: Unknown (08/05/2023)   Exercise Vital Sign    Days of Exercise per Week: 0 days    Minutes of Exercise per Session: Not on file  Stress: No Stress Concern Present (08/05/2023)   [employer] of Occupational Health - Occupational Stress Questionnaire    Feeling of Stress : Not at all  Social Connections: Moderately Integrated (08/05/2023)   Social Connection and Isolation Panel [NHANES]    Frequency of Communication with Friends and Family: More than three times a week    Frequency of Social Gatherings with Friends and Family: Once a week    Attends Religious Services: More than 4 times per year    Active Member of [employer] or Organizations: No    Attends Engineer, structural: Not on file    Marital Status: Married  Catering manager Violence: Not on file    Review of Systems:    Constitutional: No weight loss, fever, chills, weakness or fatigue HEENT: Eyes: No change  in vision               Ears, Nose, Throat:  No change in hearing or congestion Skin: No rash or itching Cardiovascular: No chest pain, chest pressure or palpitations   Respiratory: No SOB or cough Gastrointestinal: See HPI and otherwise negative Genitourinary: No dysuria or change in urinary frequency Neurological: No headache, dizziness or syncope Musculoskeletal: No new muscle or joint pain Hematologic: No bleeding or  bruising Psychiatric: No history of depression or anxiety    Physical Exam:  Vital signs: BP 116/86   Pulse 70   Ht 5' 8.5" (1.74 m)   Wt 249 lb 4 oz (113.1 kg)   BMI 37.35 kg/m   Constitutional: NAD, alert and cooperative Head:  Normocephalic and atraumatic. Eyes:   PEERL, EOMI. No icterus. Conjunctiva pink. Respiratory: Respirations even and unlabored. Lungs clear to auscultation bilaterally.   No wheezes, crackles, or rhonchi.  Cardiovascular:  Regular rate and rhythm. No peripheral edema, cyanosis or pallor.  Gastrointestinal:  Soft, nondistended, nontender. No rebound or guarding. Normal bowel sounds. No appreciable masses or hepatomegaly. Rectal:  Declines Msk:  Symmetrical without gross deformities. Without edema, no deformity or joint abnormality.  Neurologic:  Alert and  oriented x4;  grossly normal neurologically.  Skin:   Dry and intact without significant lesions or rashes. Psychiatric: Oriented to person, place and time. Demonstrates good judgement and reason without abnormal affect or behaviors.   RELEVANT LABS AND IMAGING: CBC    Component Value Date/Time   WBC 5.0 08/06/2023 0842   RBC 4.18 (L) 08/06/2023 0842   HGB 11.9 (L) 08/06/2023 0842   HCT 36.4 (L) 08/06/2023 0842   PLT 210.0 08/06/2023 0842   MCV 87.1 08/06/2023 0842   MCH 28.6 03/15/2023 1104   MCHC 32.6 08/06/2023 0842   RDW 15.3 08/06/2023 0842   LYMPHSABS 1.8 08/06/2023 0842   MONOABS 0.4 08/06/2023 0842   EOSABS 0.2 08/06/2023 0842   BASOSABS 0.0 08/06/2023 0842    CMP     Component Value Date/Time   NA 138 08/06/2023 0842   K 4.3 08/06/2023 0842   CL 103 08/06/2023 0842   CO2 28 08/06/2023 0842   GLUCOSE 117 (H) 08/06/2023 0842   BUN 20 08/06/2023 0842   CREATININE 1.37 08/06/2023 0842   CREATININE 1.24 03/15/2023 1104   CALCIUM  9.5 08/06/2023 0842   PROT 7.2 08/06/2023 0842   ALBUMIN 4.1 08/06/2023 0842   AST 15 08/06/2023 0842   ALT 13 08/06/2023 0842   ALKPHOS 62 08/06/2023  0842   BILITOT 0.9 08/06/2023 0842   GFRNONAA 73 01/25/2020 1510   GFRAA 85 01/25/2020 1510     Assessment/Plan:   Iron deficiency anemia Positive heme occult Colonoscopy 2020 with small polyp (benign lymphoid aggregate), hemorrhoids, otherwise unrevealing.  Previous colonoscopies with history of tubular adenomas.  Mild intermittent rectal bleeding associated with hemorrhoids and straining with bowel movements.  On iron every other day for 2 years. Possible that his hemorrhoids triggered the stool study to be positive.  However, with the presence of iron deficiency anemia it is best to further evaluate with endoscopic evaluation. - CBC, CMP, iron studies, vitamin B12/ferritin - EGD/colonoscopy for further evaluation - Continue iron supplementation - Recommend increasing water and increasing fiber to combat intermittent constipation and if no improvement can add in MiraLAX - I thoroughly discussed the procedure with the patient (at bedside) to include nature of the procedure, alternatives, benefits, and risks (including but not limited to  bleeding, infection, perforation, anesthesia/cardiac pulmonary complications).  Patient verbalized understanding and gave verbal consent to proceed with procedure.   Hemorrhoids Discussed importance of optimizing bowel regimen to prevent worsening hemorrhoids - Increase water, increase fiber, increase exercise  CKD Type 2 diabetes  Assigned to Dr. Cherryl Corona based on procedure schedule  Suzanna Erp, PA-C [facility] 08/29/2023, 9:10 AM  Cc: Henson, Vickie L, NP-C

## 2023-09-11 NOTE — Progress Notes (Signed)
 Agree with the assessment and plan as outlined by Boone Master, PA-C.

## 2023-09-25 ENCOUNTER — Ambulatory Visit: Payer: Medicare Other | Admitting: Internal Medicine

## 2023-10-01 ENCOUNTER — Ambulatory Visit (INDEPENDENT_AMBULATORY_CARE_PROVIDER_SITE_OTHER)

## 2023-10-01 ENCOUNTER — Other Ambulatory Visit: Payer: Self-pay | Admitting: Family

## 2023-10-01 VITALS — Ht 68.5 in | Wt 245.0 lb

## 2023-10-01 DIAGNOSIS — N1831 Chronic kidney disease, stage 3a: Secondary | ICD-10-CM

## 2023-10-01 DIAGNOSIS — Z0000 Encounter for general adult medical examination without abnormal findings: Secondary | ICD-10-CM | POA: Diagnosis not present

## 2023-10-01 MED ORDER — METFORMIN HCL ER 500 MG PO TB24: ORAL_TABLET | ORAL | 1 refills | Status: AC

## 2023-10-01 NOTE — Progress Notes (Signed)
 Subjective:  Please attest and cosign this visit due to patients primary care provider not being in the office at the time the visit was completed.  (Pt of Boby Mackintosh, NP)   Gabriel Livingston is a 68 y.o. who presents for a Medicare Wellness preventive visit.  As a reminder, Annual Wellness Visits don't include a physical exam, and some assessments may be limited, especially if this visit is performed virtually. We may recommend an in-person follow-up visit with your provider if needed.  Visit Complete: Virtual I connected with  Gabriel Livingston on 10/01/23 by a audio enabled telemedicine application and verified that I am speaking with the correct person using two identifiers.  Patient Location: Home  Provider Location: Office/Clinic  I discussed the limitations of evaluation and management by telemedicine. The patient expressed understanding and agreed to proceed.  Vital Signs: Because this visit was a virtual/telehealth visit, some criteria may be missing or patient reported. Any vitals not documented were not able to be obtained and vitals that have been documented are patient reported.  VideoDeclined- This patient declined Librarian, academic. Therefore the visit was completed with audio only.  Persons Participating in Visit: Patient.  AWV Questionnaire: Yes: Patient Medicare AWV questionnaire was completed by the patient on 09/30/2023; I have confirmed that all information answered by patient is correct and no changes since this date.  Cardiac Risk Factors include: advanced age (>76men, >37 women);diabetes mellitus;dyslipidemia;hypertension;male gender;obesity (BMI >30kg/m2)     Objective:    Today's Vitals   10/01/23 1435  Weight: 245 lb (111.1 kg)  Height: 5' 8.5 (1.74 m)   Body mass index is 36.71 kg/m.     10/01/2023    2:34 PM 06/07/2022   11:25 AM 01/11/2014    8:10 AM 12/28/2013    4:15 PM  Advanced Directives  Does Patient  Have a Medical Advance Directive? Yes Yes Yes  Yes   Type of Estate agent of [place];Living will Living will Healthcare Power of [place];Living will  Healthcare Power of [place];Living will   Copy of Healthcare Power of Attorney in Chart? No - copy requested        Data saved with a previous flowsheet row definition    Current Medications (verified) Outpatient Encounter Medications as of 10/01/2023  Medication Sig   bisoprolol -hydrochlorothiazide  (ZIAC ) 10-6.25 MG tablet TAKE 1 TABLET BY MOUTH DAILY FOR BLOOD PRESSURE   doxazosin  (CARDURA ) 8 MG tablet Take 1 tablet daily at Bedtime   Ferrous Sulfate (IRON PO) Take 65 mg by mouth daily.   metFORMIN  (GLUCOPHAGE -XR) 500 MG 24 hr tablet Take 1 to 2 tablets 2 x /day with Meals for Diabetes.   Na Sulfate-K Sulfate-Mg Sulfate concentrate (SUPREP) 17.5-3.13-1.6 GM/177ML SOLN Use as directed; may use generic; goodrx card if insurance will not cover generic   rosuvastatin  (CRESTOR ) 40 MG tablet Take  1 tablet  Daily   for Cholesterol                                                                         /  TAKE                                                    BY                                                   MOUTH   tadalafil  (CIALIS ) 20 MG tablet Take 1/2 to 1 tablet every 2 to 3 days as needed for XXXX   zinc gluconate 50 MG tablet Take 50 mg by mouth daily.   No facility-administered encounter medications on file as of 10/01/2023.    Allergies (verified) Patient has no known allergies.   History: Past Medical History:  Diagnosis Date   Anemia    CKD (chronic kidney disease) stage 3, GFR 30-59 ml/min (HCC)    ED (erectile dysfunction)    Enlarged prostate    Hyperlipidemia    Hypertension    Hypogonadism male    Other abnormal glucose    Overactive bladder    Pre-diabetes    Sleep apnea    mild, no cpap   Unspecified vitamin D  deficiency    Past Surgical  History:  Procedure Laterality Date   COLONOSCOPY  2015   hx polyp   JOINT REPLACEMENT     POLYPECTOMY     colon polyps   Family History  Problem Relation Age of Onset   Heart disease Mother    Hyperlipidemia Mother    Hypertension Mother    Cancer Father        lung, prostate   Cancer Sister    Breast cancer Sister    Colon cancer Neg Hx    Rectal cancer Neg Hx    Stomach cancer Neg Hx    Esophageal cancer Neg Hx    Social History   Socioeconomic History   Marital status: Married    Spouse name: Not on file   Number of children: Not on file   Years of education: Not on file   Highest education level: Associate degree: occupational, Scientist, product/process development, or vocational program  Occupational History   Not on file  Tobacco Use   Smoking status: Never   Smokeless tobacco: Never  Vaping Use   Vaping status: Never Used  Substance and Sexual Activity   Alcohol use: No   Drug use: No   Sexual activity: Not Currently  Other Topics Concern   Not on file  Social History Narrative   Married   Social Drivers of Health   Financial Resource Strain: Low Risk  (10/01/2023)   Overall Financial Resource Strain (CARDIA)    Difficulty of Paying Living Expenses: Not hard at all  Food Insecurity: No Food Insecurity (10/01/2023)   Hunger Vital Sign    Worried About Running Out of Food in the Last Year: Never true    Ran Out of Food in the Last Year: Never true  Transportation Needs: No Transportation Needs (10/01/2023)   PRAPARE - Administrator, Civil Service (Medical): No    Lack of Transportation (Non-Medical): No  Physical Activity: Sufficiently Active (10/01/2023)   Exercise Vital Sign    Days of Exercise per Week: 5 days    Minutes of Exercise per Session:  130 min  Stress: No Stress Concern Present (10/01/2023)   [employer] of Occupational Health - Occupational Stress Questionnaire    Feeling of Stress: Not at all  Social Connections: Socially Integrated (10/01/2023)    Social Connection and Isolation Panel    Frequency of Communication with Friends and Family: More than three times a week    Frequency of Social Gatherings with Friends and Family: Once a week    Attends Religious Services: More than 4 times per year    Active Member of [employer] or Organizations: No    Attends Engineer, structural: More than 4 times per year    Marital Status: Married    Tobacco Counseling Counseling given: No    Clinical Intake:  Pre-visit preparation completed: Yes  Pain : No/denies pain     BMI - recorded: 36.71 Nutritional Status: BMI > 30  Obese Nutritional Risks: None Diabetes: Yes CBG done?: No Did pt. bring in CBG monitor from home?: No  Lab Results  Component Value Date   HGBA1C 6.4 08/06/2023   HGBA1C 6.3 (H) 01/16/2023   HGBA1C 6.2 (H) 10/16/2022     How often do you need to have someone help you when you read instructions, pamphlets, or other written materials from your doctor or pharmacy?: 1 - Never  Interpreter Needed?: No  Information entered by :: Verdie Saba, CMA   Activities of Daily Living     10/01/2023    2:41 PM 09/30/2023    5:25 PM  In your present state of health, do you have any difficulty performing the following activities:  Hearing? 0 0  Vision? 0 0  Difficulty concentrating or making decisions? 0 0  Walking or climbing stairs? 0 0  Dressing or bathing? 0 0  Doing errands, shopping? 0 0  Preparing Food and eating ? N N  Using the Toilet? N N  In the past six months, have you accidently leaked urine? N N  Do you have problems with loss of bowel control? N N  Managing your Medications? N N  Managing your Finances? N N  Housekeeping or managing your Housekeeping? N N    Patient Care Team: Lendia Boby CROME, NP-C as PCP - General (Family Medicine)  I have updated your Care Teams any recent Medical Services you may have received from other providers in the past year.     Assessment:   This is a routine  wellness examination for Gabriel Livingston.  Hearing/Vision screen Hearing Screening - Comments:: Denies hearing difficulties   Vision Screening - Comments:: Wears rx glasses - up to date with routine eye exams with [facility]   Goals Addressed               This Visit's Progress     Patient Stated (pt-stated)        Patient stated he plans to lose weight - lose about 25-35lbs       Depression Screen     10/01/2023    2:43 PM 08/06/2023    8:04 AM 10/20/2022    8:08 PM 06/07/2022   11:25 AM 03/08/2022    4:27 PM 03/08/2022    4:26 PM 01/30/2021   12:13 AM  PHQ 2/9 Scores  PHQ - 2 Score 0 0 0 0 0 0 0  PHQ- 9 Score 0          Fall Risk     10/01/2023    2:42 PM 09/30/2023    5:25 PM  08/06/2023    8:04 AM 10/20/2022    8:08 PM 06/07/2022   11:25 AM  Fall Risk   Falls in the past year? 0 0 0 0 0  Number falls in past yr: 0  0  0  Injury with Fall? 0  0  0  Risk for fall due to : No Fall Risks  No Fall Risks No Fall Risks No Fall Risks  Follow up Falls evaluation completed;Falls prevention discussed  Falls evaluation completed Falls prevention discussed;Education provided;Falls evaluation completed Falls evaluation completed;Falls prevention discussed    MEDICARE RISK AT HOME:  Medicare Risk at Home Any stairs in or around the home?: Yes (outside) If so, are there any without handrails?: No Home free of loose throw rugs in walkways, pet beds, electrical cords, etc?: Yes Adequate lighting in your home to reduce risk of falls?: Yes Life alert?: No Use of a cane, walker or w/c?: No Grab bars in the bathroom?: No Shower chair or bench in shower?: No Elevated toilet seat or a handicapped toilet?: Yes  TIMED UP AND GO:  Was the test performed?  No  Cognitive Function: 6CIT completed        10/01/2023    2:45 PM  6CIT Screen  What Year? 0 points  What month? 0 points  What time? 0 points  Count back from 20 0 points  Months in reverse 0 points  Repeat phrase 0 points   Total Score 0 points    Immunizations Immunization History  Administered Date(s) Administered   PPD Test 05/06/2013, 05/07/2014, 08/18/2015, 10/04/2016, 11/19/2017, 12/24/2018, 01/25/2020   Pfizer Covid-19 Vaccine Bivalent Booster 28yrs & up 02/25/2021   Td 04/02/2005   Tdap 08/18/2015    Screening Tests Health Maintenance  Topic Date Due   Pneumococcal Vaccine: 50+ Years (1 of 2 - PCV) Never done   Zoster Vaccines- Shingrix (1 of 2) Never done   COVID-19 Vaccine (2 - Pfizer risk series) 03/18/2021   INFLUENZA VACCINE  11/01/2023   HEMOGLOBIN A1C  02/06/2024   FOOT EXAM  03/13/2024   OPHTHALMOLOGY EXAM  04/07/2024   Diabetic kidney evaluation - Urine ACR  08/05/2024   Diabetic kidney evaluation - eGFR measurement  08/28/2024   Medicare Annual Wellness (AWV)  09/30/2024   DTaP/Tdap/Td (3 - Td or Tdap) 08/17/2025   Colonoscopy  02/22/2026   Hepatitis C Screening  Completed   Hepatitis B Vaccines  Aged Out   HPV VACCINES  Aged Out   Meningococcal B Vaccine  Aged Out    Health Maintenance  Health Maintenance Due  Topic Date Due   Pneumococcal Vaccine: 50+ Years (1 of 2 - PCV) Never done   Zoster Vaccines- Shingrix (1 of 2) Never done   COVID-19 Vaccine (2 - Pfizer risk series) 03/18/2021   Health Maintenance Items Addressed: I have recommended that this patient have a immunization for Pneumonia and Shingles but he declines at this time. I have discussed the risks and benefits of this procedure with him. The patient verbalizes understanding.   Additional Screening:  Vision Screening: Recommended annual ophthalmology exams for early detection of glaucoma and other disorders of the eye. Would you like a referral to an eye doctor? No  Patient stated had diabetic eye exam in 04/2023.  Dental Screening: Recommended annual dental exams for proper oral hygiene  Community Resource Referral / Chronic Care Management: CRR required this visit?  No   CCM required this visit?   No   Plan:  I have personally reviewed and noted the following in the patient's chart:   Medical and social history Use of alcohol, tobacco or illicit drugs  Current medications and supplements including opioid prescriptions. Patient is not currently taking opioid prescriptions. Functional ability and status Nutritional status Physical activity Advanced directives List of other physicians Hospitalizations, surgeries, and ER visits in previous 12 months Vitals Screenings to include cognitive, depression, and falls Referrals and appointments  In addition, I have reviewed and discussed with patient certain preventive protocols, quality metrics, and best practice recommendations. A written personalized care plan for preventive services as well as general preventive health recommendations were provided to patient.   Verdie CHRISTELLA Saba, CMA   10/01/2023   After Visit Summary: (MyChart) Due to this being a telephonic visit, the after visit summary with patients personalized plan was offered to patient via MyChart   Notes: Nothing significant to report at this time.

## 2023-10-01 NOTE — Patient Instructions (Addendum)
 Gabriel Livingston , Thank you for taking time out of your busy schedule to complete your Annual Wellness Visit with me. I enjoyed our conversation and look forward to speaking with you again next year. I, as well as your care team,  appreciate your ongoing commitment to your health goals. Please review the following plan we discussed and let me know if I can assist you in the future. Your Game plan/ To Do List     Follow up Visits: Next Medicare AWV with our clinical staff: 10/01/2024 - by telephone    Have you seen your provider in the last 6 months (3 months if uncontrolled diabetes)? Yes Next Office Visit with your provider: 11/06/2023  Clinician Recommendations:  Aim for 30 minutes of exercise or brisk walking, 6-8 glasses of water, and 5 servings of fruits and vegetables each day. Educated and advised on getting the Shingles, Pneumonia and COVID vaccines in 2025.        This is a list of the screening recommended for you and due dates:  Health Maintenance  Topic Date Due   Pneumococcal Vaccine for age over 79 (1 of 2 - PCV) Never done   Zoster (Shingles) Vaccine (1 of 2) Never done   COVID-19 Vaccine (2 - Pfizer risk series) 03/18/2021   Flu Shot  11/01/2023   Hemoglobin A1C  02/06/2024   Complete foot exam   03/13/2024   Eye exam for diabetics  04/07/2024   Yearly kidney health urinalysis for diabetes  08/05/2024   Yearly kidney function blood test for diabetes  08/28/2024   Medicare Annual Wellness Visit  09/30/2024   DTaP/Tdap/Td vaccine (3 - Td or Tdap) 08/17/2025   Colon Cancer Screening  02/22/2026   Hepatitis C Screening  Completed   Hepatitis B Vaccine  Aged Out   HPV Vaccine  Aged Out   Meningitis B Vaccine  Aged Out    Advanced directives: (Copy Requested) Please bring a copy of your health care power of attorney and living will to the office to be added to your chart at your convenience. You can mail to [facility] [address] [place], [place] [address] or  email to [email or web address] Advance Care Planning is important because it:  [x]  Makes sure you receive the medical care that is consistent with your values, goals, and preferences  [x]  It provides guidance to your family and loved ones and reduces their decisional burden about whether or not they are making the right decisions based on your wishes.  Follow the link provided in your after visit summary or read over the paperwork we have mailed to you to help you started getting your Advance Directives in place. If you need assistance in completing these, please reach out to us  so that we can help you!

## 2023-10-01 NOTE — Telephone Encounter (Signed)
 Copied from CRM [id number]. Topic: Clinical - Medication Refill >> Oct 01, 2023  2:54 PM Laquanda P wrote: Medication: metFORMIN  (GLUCOPHAGE -XR) 500 MG 24 hr tablet  Has the patient contacted their pharmacy? Yes (Agent: If no, request that the patient contact the pharmacy for the refill. If patient does not wish to contact the pharmacy document the reason why and proceed with request.) (Agent: If yes, when and what did the pharmacy advise?)  This is the patient's preferred pharmacy:  [id number] - [place], [place] - [address] AT [facility] [address] [place] [address] Phone: [phone] Fax: [phone]  Is this the correct pharmacy for this prescription? Yes If no, delete pharmacy and type the correct one.   Has the prescription been filled recently? No  Is the patient out of the medication? Has 5 Pills left  Has the patient been seen for an appointment in the last year OR does the patient have an upcoming appointment? Yes  Can we respond through MyChart? Yes  Agent: Please be advised that Rx refills may take up to 3 business days. We ask that you follow-up with your pharmacy.

## 2023-10-02 ENCOUNTER — Other Ambulatory Visit: Payer: Self-pay

## 2023-10-02 ENCOUNTER — Telehealth: Payer: Self-pay | Admitting: Gastroenterology

## 2023-10-02 MED ORDER — NA SULFATE-K SULFATE-MG SULF 17.5-3.13-1.6 GM/177ML PO SOLN: ORAL | 0 refills | Status: DC

## 2023-10-02 NOTE — Telephone Encounter (Signed)
 Spoke to patient resent prep to pharmacy

## 2023-10-02 NOTE — Telephone Encounter (Signed)
 PT is calling to have Suprep resent to [facility] [address]. Requesting that a nurse calls him once called in. Please advise.

## 2023-10-08 ENCOUNTER — Encounter: Payer: Self-pay | Admitting: Gastroenterology

## 2023-10-08 ENCOUNTER — Ambulatory Visit: Admitting: Gastroenterology

## 2023-10-08 VITALS — BP 128/64 | HR 61 | Temp 97.6°F | Resp 17 | Ht 68.5 in | Wt 249.0 lb

## 2023-10-08 DIAGNOSIS — D509 Iron deficiency anemia, unspecified: Secondary | ICD-10-CM

## 2023-10-08 DIAGNOSIS — K5710 Diverticulosis of small intestine without perforation or abscess without bleeding: Secondary | ICD-10-CM | POA: Diagnosis not present

## 2023-10-08 DIAGNOSIS — R195 Other fecal abnormalities: Secondary | ICD-10-CM | POA: Diagnosis not present

## 2023-10-08 DIAGNOSIS — K635 Polyp of colon: Secondary | ICD-10-CM | POA: Diagnosis not present

## 2023-10-08 DIAGNOSIS — B9681 Helicobacter pylori [H. pylori] as the cause of diseases classified elsewhere: Secondary | ICD-10-CM

## 2023-10-08 DIAGNOSIS — K640 First degree hemorrhoids: Secondary | ICD-10-CM

## 2023-10-08 DIAGNOSIS — D124 Benign neoplasm of descending colon: Secondary | ICD-10-CM

## 2023-10-08 DIAGNOSIS — K2970 Gastritis, unspecified, without bleeding: Secondary | ICD-10-CM | POA: Diagnosis not present

## 2023-10-08 MED ORDER — SODIUM CHLORIDE 0.9 % IV SOLN: 500.0000 mL | Freq: Once | INTRAVENOUS | Status: DC

## 2023-10-08 NOTE — Progress Notes (Unsigned)
 [facility] History and Physical   Primary Care Physician:  Lendia Boby CROME, NP-C   Reason for Procedure:   Positive fecal occult blood test, iron deficiency anemia  Plan:    EGD, colonoscopy     HPI: Gabriel Livingston is a 68 y.o. male undergoing EGD and colonoscopy to evaluate iron deficiency anemia and heme-positive stool.  He has no family history of colon cancer and no chronic GI symptoms other than occasional bright red blood per rectum.  His last colonoscopy was in 2020 and a small polypoid was removed (lymphoid aggregate).   Past Medical History:  Diagnosis Date   Anemia    CKD (chronic kidney disease) stage 3, GFR 30-59 ml/min (HCC)    Diabetes mellitus without complication (HCC)    ED (erectile dysfunction)    Enlarged prostate    Hyperlipidemia    Hypertension    Hypogonadism male    Other abnormal glucose    Overactive bladder    Pre-diabetes    Sleep apnea    mild, no cpap   Unspecified vitamin D  deficiency     Past Surgical History:  Procedure Laterality Date   COLONOSCOPY  2015   hx polyp   JOINT REPLACEMENT     POLYPECTOMY     colon polyps    Prior to Admission medications   Medication Sig Start Date End Date Taking? Authorizing Provider  Ferrous Sulfate (IRON PO) Take 65 mg by mouth daily.   Yes [provider]  Na Sulfate-K Sulfate-Mg Sulfate concentrate (SUPREP) 17.5-3.13-1.6 GM/177ML SOLN Use as directed; may use generic; goodrx card if insurance will not cover generic 10/02/23  Yes McMichael, Bayley M, PA-C  rosuvastatin  (CRESTOR ) 40 MG tablet Take  1 tablet  Daily   for Cholesterol                                                                         /                                                   TAKE                                                    BY                                                   MOUTH 07/22/22  Yes Tonita Fallow, MD  zinc gluconate 50 MG tablet Take 50 mg by mouth daily.   Yes [provider]  bisoprolol -hydrochlorothiazide  (ZIAC ) 10-6.25 MG tablet TAKE 1 TABLET BY MOUTH DAILY FOR BLOOD PRESSURE 06/20/23   Webb, Padonda B, FNP  doxazosin  (CARDURA ) 8 MG tablet Take 1 tablet daily at Bedtime 08/14/23   Henson, Vickie L, NP-C  metFORMIN  (GLUCOPHAGE -XR)  500 MG 24 hr tablet Take 1 to 2 tablets 2 x /day with Meals for Diabetes. 10/01/23   Henson, Vickie L, NP-C  tadalafil  (CIALIS ) 20 MG tablet Take 1/2 to 1 tablet every 2 to 3 days as needed for XXXX 04/24/19   Tonita Fallow, MD    Current Outpatient Medications  Medication Sig Dispense Refill   Ferrous Sulfate (IRON PO) Take 65 mg by mouth daily.     Na Sulfate-K Sulfate-Mg Sulfate concentrate (SUPREP) 17.5-3.13-1.6 GM/177ML SOLN Use as directed; may use generic; goodrx card if insurance will not cover generic 354 mL 0   rosuvastatin  (CRESTOR ) 40 MG tablet Take  1 tablet  Daily   for Cholesterol                                                                         /                                                   TAKE                                                    BY                                                   MOUTH 90 tablet 3   zinc gluconate 50 MG tablet Take 50 mg by mouth daily.     bisoprolol -hydrochlorothiazide  (ZIAC ) 10-6.25 MG tablet TAKE 1 TABLET BY MOUTH DAILY FOR BLOOD PRESSURE 100 tablet 0   doxazosin  (CARDURA ) 8 MG tablet Take 1 tablet daily at Bedtime 90 tablet 1   metFORMIN  (GLUCOPHAGE -XR) 500 MG 24 hr tablet Take 1 to 2 tablets 2 x /day with Meals for Diabetes. 360 tablet 1   tadalafil  (CIALIS ) 20 MG tablet Take 1/2 to 1 tablet every 2 to 3 days as needed for XXXX 30 tablet 3   Current Facility-Administered Medications  Medication Dose Route Frequency Provider Last Rate Last Admin   0.9 %  sodium chloride  infusion  500 mL Intravenous Once Jailin Moomaw E, MD        Allergies as of 10/08/2023   (No Known Allergies)    Family History  Problem Relation Age of Onset   Heart disease  Mother    Hyperlipidemia Mother    Hypertension Mother    Cancer Father        lung, prostate   Cancer Sister    Breast cancer Sister    Colon cancer Neg Hx    Rectal cancer Neg Hx    Stomach cancer Neg Hx    Esophageal cancer Neg Hx     Social History   Socioeconomic History   Marital status: Married    Spouse name: Not on file  Number of children: Not on file   Years of education: Not on file   Highest education level: Associate degree: occupational, technical, or vocational program  Occupational History   Not on file  Tobacco Use   Smoking status: Never   Smokeless tobacco: Never  Vaping Use   Vaping status: Never Used  Substance and Sexual Activity   Alcohol use: No   Drug use: No   Sexual activity: Not Currently  Other Topics Concern   Not on file  Social History Narrative   Married   Social Drivers of Health   Financial Resource Strain: Low Risk  (10/01/2023)   Overall Financial Resource Strain (CARDIA)    Difficulty of Paying Living Expenses: Not hard at all  Food Insecurity: No Food Insecurity (10/01/2023)   Hunger Vital Sign    Worried About Running Out of Food in the Last Year: Never true    Ran Out of Food in the Last Year: Never true  Transportation Needs: No Transportation Needs (10/01/2023)   PRAPARE - Administrator, Civil Service (Medical): No    Lack of Transportation (Non-Medical): No  Physical Activity: Sufficiently Active (10/01/2023)   Exercise Vital Sign    Days of Exercise per Week: 5 days    Minutes of Exercise per Session: 130 min  Stress: No Stress Concern Present (10/01/2023)   [employer] of Occupational Health - Occupational Stress Questionnaire    Feeling of Stress: Not at all  Social Connections: Socially Integrated (10/01/2023)   Social Connection and Isolation Panel    Frequency of Communication with Friends and Family: More than three times a week    Frequency of Social Gatherings with Friends and Family: Once a week     Attends Religious Services: More than 4 times per year    Active Member of [employer] or Organizations: No    Attends Engineer, structural: More than 4 times per year    Marital Status: Married  Catering manager Violence: Not At Risk (10/01/2023)   Humiliation, Afraid, Rape, and Kick questionnaire    Fear of Current or Ex-Partner: No    Emotionally Abused: No    Physically Abused: No    Sexually Abused: No    Review of Systems:  All other review of systems negative except as mentioned in the HPI.  Physical Exam: Vital signs BP 101/68   Pulse 67   Temp 97.6 F (36.4 C) (Temporal)   Ht 5' 8.5 (1.74 m)   Wt 249 lb (112.9 kg)   SpO2 97%   BMI 37.31 kg/m   General:   Alert,  Well-developed, well-nourished, pleasant and cooperative in NAD Airway:  Mallampati 3 Lungs:  Clear throughout to auscultation.   Heart:  Regular rate and rhythm; no murmurs, clicks, rubs,  or gallops. Abdomen:  Soft, nontender and nondistended. Normal bowel sounds.   Neuro/Psych:  Normal mood and affect. A and O x 3   Amyri Frenz E. Stacia, MD [facility]

## 2023-10-08 NOTE — Op Note (Signed)
 [facility] Patient Name: Gabriel Livingston Procedure Date: 10/08/2023 2:08 PM MRN: [id number] Endoscopist: Glendia E. Stacia , MD, [id number] Age: 68 Referring MD:  Date of Birth: 08-Jan-1956 Gender: Male Account #: [id number] Procedure:                Colonoscopy Indications:              Heme positive stool, Iron deficiency anemia Medicines:                Monitored Anesthesia Care Procedure:                Pre-Anesthesia Assessment:                           - Prior to the procedure, a History and Physical                            was performed, and patient medications and                            allergies were reviewed. The patient's tolerance of                            previous anesthesia was also reviewed. The risks                            and benefits of the procedure and the sedation                            options and risks were discussed with the patient.                            All questions were answered, and informed consent                            was obtained. Prior Anticoagulants: The patient has                            taken no anticoagulant or antiplatelet agents. ASA                            Grade Assessment: III - A patient with severe                            systemic disease. After reviewing the risks and                            benefits, the patient was deemed in satisfactory                            condition to undergo the procedure.                           After obtaining informed consent, the colonoscope  was passed under direct vision. Throughout the                            procedure, the patient's blood pressure, pulse, and                            oxygen saturations were monitored continuously. The                            CF [id number] #[id number] was introduced through the anus                            and advanced to the the cecum, identified by                             appendiceal orifice and ileocecal valve. The                            colonoscopy was performed without difficulty. The                            patient tolerated the procedure well. The quality                            of the bowel preparation was good. The ileocecal                            valve, appendiceal orifice, and rectum were                            photographed. Scope In: 2:29:37 PM Scope Out: 2:41:58 PM Scope Withdrawal Time: 0 hours 9 minutes 51 seconds  Total Procedure Duration: 0 hours 12 minutes 21 seconds  Findings:                 The perianal and digital rectal examinations were                            normal. Pertinent negatives include normal                            sphincter tone and no palpable rectal lesions.                           A 3 mm polyp was found in the descending colon. The                            polyp was sessile. The polyp was removed with a                            cold snare. Resection and retrieval were complete.                            Estimated blood loss was minimal.  The exam was otherwise normal throughout the                            examined colon.                           Non-bleeding internal hemorrhoids were found during                            retroflexion. The hemorrhoids were Grade I                            (internal hemorrhoids that do not prolapse).                           No additional abnormalities were found on                            retroflexion. Complications:            No immediate complications. Estimated Blood Loss:     Estimated blood loss was minimal. Impression:               - One 3 mm polyp in the descending colon, removed                            with a cold snare. Resected and retrieved.                           - Non-bleeding internal hemorrhoids. Recommendation:           - Patient has a contact number available for                             emergencies. The signs and symptoms of potential                            delayed complications were discussed with the                            patient. Return to normal activities tomorrow.                            Written discharge instructions were provided to the                            patient.                           - Resume previous diet.                           - Continue present medications.                           - Await pathology results.                           -  Repeat colonoscopy (date not yet determined) for                            surveillance based on pathology results. Matia Zelada E. Stacia, MD 10/08/2023 2:54:20 PM This report has been signed electronically.

## 2023-10-08 NOTE — Progress Notes (Unsigned)
I have reviewed the patient's medical history in detail and updated the computerized patient record.

## 2023-10-08 NOTE — Progress Notes (Unsigned)
 Vss nad trans to pacu

## 2023-10-08 NOTE — Patient Instructions (Addendum)
 YOU HAD AN ENDOSCOPIC PROCEDURE TODAY AT THE [facility]:   Refer to the procedure report that was given to you for any specific questions about what was found during the examination.  If the procedure report does not answer your questions, please call your gastroenterologist to clarify.  If you requested that your care partner not be given the details of your procedure findings, then the procedure report has been included in a sealed envelope for you to review at your convenience later.  YOU SHOULD EXPECT: Some feelings of bloating in the abdomen. Passage of more gas than usual.  Walking can help get rid of the air that was put into your GI tract during the procedure and reduce the bloating. If you had a lower endoscopy (such as a colonoscopy or flexible sigmoidoscopy) you may notice spotting of blood in your stool or on the toilet paper. If you underwent a bowel prep for your procedure, you may not have a normal bowel movement for a few days.  Please Note:  You might notice some irritation and congestion in your nose or some drainage.  This is from the oxygen used during your procedure.  There is no need for concern and it should clear up in a day or so.  SYMPTOMS TO REPORT IMMEDIATELY:  Following lower endoscopy (colonoscopy or flexible sigmoidoscopy):  Excessive amounts of blood in the stool  Significant tenderness or worsening of abdominal pains  Swelling of the abdomen that is new, acute  Fever of 100F or higher  Following upper endoscopy (EGD)  Vomiting of blood or coffee ground material  New chest pain or pain under the shoulder blades  Painful or persistently difficult swallowing  New shortness of breath  Fever of 100F or higher  Black, tarry-looking stools  Endoscopy Resume previous diet Continue present medications Await pathology results  Colonoscopy Resume previous diet Continue present medications Await pathology results   For urgent or emergent issues, a  gastroenterologist can be reached at any hour by calling [phone]. Do not use MyChart messaging for urgent concerns.    DIET:  We do recommend a small meal at first, but then you may proceed to your regular diet.  Drink plenty of fluids but you should avoid alcoholic beverages for 24 hours.  ACTIVITY:  You should plan to take it easy for the rest of today and you should NOT DRIVE or use heavy machinery until tomorrow (because of the sedation medicines used during the test).    FOLLOW UP: Our staff will call the number listed on your records the next business day following your procedure.  We will call around 7:15- 8:00 am to check on you and address any questions or concerns that you may have regarding the information given to you following your procedure. If we do not reach you, we will leave a message.     If any biopsies were taken you will be contacted by phone or by letter within the next 1-3 weeks.  Please call us  at [phone] if you have not heard about the biopsies in 3 weeks.    SIGNATURES/CONFIDENTIALITY: You and/or your care partner have signed paperwork which will be entered into your electronic medical record.  These signatures attest to the fact that that the information above on your After Visit Summary has been reviewed and is understood.  Full responsibility of the confidentiality of this discharge information lies with you and/or your care-partner.

## 2023-10-08 NOTE — Progress Notes (Signed)
 Called to room to assist during endoscopic procedure.  Patient ID and intended procedure confirmed with present staff. Received instructions for my participation in the procedure from the performing physician.

## 2023-10-08 NOTE — Op Note (Signed)
 [facility] Patient Name: Gabriel Livingston Procedure Date: 10/08/2023 2:07 PM MRN: [id number] Endoscopist: Glendia E. Stacia , MD, [id number] Age: 68 Referring MD:  Date of Birth: 10/12/1955 Gender: Male Account #: [id number] Procedure:                Upper GI endoscopy Indications:              Iron deficiency anemia Medicines:                Monitored Anesthesia Care Procedure:                Pre-Anesthesia Assessment:                           - Prior to the procedure, a History and Physical                            was performed, and patient medications and                            allergies were reviewed. The patient's tolerance of                            previous anesthesia was also reviewed. The risks                            and benefits of the procedure and the sedation                            options and risks were discussed with the patient.                            All questions were answered, and informed consent                            was obtained. Prior Anticoagulants: The patient has                            taken no anticoagulant or antiplatelet agents. ASA                            Grade Assessment: III - A patient with severe                            systemic disease. After reviewing the risks and                            benefits, the patient was deemed in satisfactory                            condition to undergo the procedure.                           After obtaining informed consent, the endoscope was  passed under direct vision. Throughout the                            procedure, the patient's blood pressure, pulse, and                            oxygen saturations were monitored continuously. The                            GIF HQ190 #[id number] was introduced through the                            mouth, and advanced to the second part of duodenum.                            The upper GI endoscopy  was accomplished without                            difficulty. The patient tolerated the procedure                            well. Scope In: 2:19:46 PM Scope Out: 2:26:18 PM Total Procedure Duration: 0 hours 6 minutes 32 seconds  Findings:                 The examined portions of the nasopharynx,                            oropharynx and larynx were normal.                           The examined esophagus was normal.                           The entire examined stomach was normal. Biopsies                            were taken with a cold forceps for Helicobacter                            pylori testing. Estimated blood loss was minimal.                           A medium diverticulum was found in the area of the                            papilla.                           The examined duodenum was otherwise normal.                            Biopsies for histology were taken with a cold  forceps for evaluation of celiac disease. Estimated                            blood loss was minimal. Complications:            No immediate complications. Estimated Blood Loss:     Estimated blood loss was minimal. Impression:               - The examined portions of the nasopharynx,                            oropharynx and larynx were normal.                           - Normal esophagus.                           - Normal stomach. Biopsied.                           - Duodenal diverticulum.                           - Normal examined duodenum. Biopsied.                           - No endoscopic abnormalities to explain iron                            deficiency anemia. Recommendation:           - Patient has a contact number available for                            emergencies. The signs and symptoms of potential                            delayed complications were discussed with the                            patient. Return to normal activities tomorrow.                             Written discharge instructions were provided to the                            patient.                           - Resume previous diet.                           - Continue present medications.                           - Await pathology results. Kennadee Walthour E. Stacia, MD 10/08/2023 2:49:56 PM This report has been signed electronically.

## 2023-10-09 ENCOUNTER — Telehealth: Payer: Self-pay

## 2023-10-09 NOTE — Telephone Encounter (Signed)
  Follow up Call-     10/08/2023    1:14 PM  Call back number  Post procedure Call Back phone  # [phone]  Permission to leave phone message Yes     Patient questions:  Do you have a fever, pain , or abdominal swelling? No. Pain Score  0 *  Have you tolerated food without any problems? Yes.    Have you been able to return to your normal activities? Yes.    Do you have any questions about your discharge instructions: Diet   No. Medications  No. Follow up visit  No.  Do you have questions or concerns about your Care? No.  Actions: * If pain score is 4 or above: No action needed, pain <4.

## 2023-10-14 LAB — SURGICAL PATHOLOGY

## 2023-10-15 ENCOUNTER — Encounter: Admitting: Gastroenterology

## 2023-10-16 ENCOUNTER — Ambulatory Visit: Payer: Self-pay | Admitting: Gastroenterology

## 2023-10-16 DIAGNOSIS — A048 Other specified bacterial intestinal infections: Secondary | ICD-10-CM

## 2023-10-16 MED ORDER — OMEPRAZOLE 20 MG PO CPDR
20.0000 mg | DELAYED_RELEASE_CAPSULE | Freq: Two times a day (BID) | ORAL | 0 refills | Status: AC
Start: 2023-10-16 — End: 2023-11-06

## 2023-10-16 MED ORDER — DOXYCYCLINE HYCLATE 100 MG PO TABS: 100.0000 mg | ORAL_TABLET | Freq: Two times a day (BID) | ORAL | 0 refills | Status: AC

## 2023-10-16 MED ORDER — METRONIDAZOLE 250 MG PO TABS: 250.0000 mg | ORAL_TABLET | Freq: Four times a day (QID) | ORAL | 0 refills | Status: AC

## 2023-10-16 MED ORDER — BISMUTH SUBSALICYLATE 262 MG PO CHEW: 524.0000 mg | CHEWABLE_TABLET | Freq: Four times a day (QID) | ORAL | 0 refills | Status: AC

## 2023-10-16 NOTE — Progress Notes (Signed)
 Gabriel Livingston, The biopsies from your recent upper GI Endoscopy were notable for H. Pylori gastritis.  H. pylori is a common bacteria which can cause chronic symptoms of abdominal pain, nausea and bloating.  It can also cause iron deficiency anemia as well as increase the risk of stomach ulcers and stomach cancer.  We need to eradicate the bacteria.  Sometimes the bacteria is very difficult to eradicate, so it is  important to take the medications as directed. Please take the medications below:  1) Omeprazole  20 mg 2 times a day x 14 d 2) Pepto Bismol 2 tabs (262 mg each) 4 times a day x 14 d 3) Metronidazole  250 mg 4 times a day x 14 d 4) doxycycline  100 mg 2 times a day x 14 d  4 weeks after treatment completed, check H. Pylori stool antigen to confirm eradication (must be off acid suppression therapy for 2 weeks prior to specimen submission)  The biopsies of your duodenum were normal.  There was no evidence of celiac disease.  The polyp that I removed during your colonoscopy was NOT precancerous.  You should continue to follow current colorectal cancer screening guidelines with a repeat colonoscopy in 10 years.    Because colon cancer screening after age 43 is done on a case-by-case basis, taking into account the patient's risk factors for colon cancer, as well as comorbidities and life expectancy, I would recommend you make an appointment with me in 10 years to discuss the risks/benefits of further colon cancer screening.

## 2023-11-06 ENCOUNTER — Encounter: Payer: Self-pay | Admitting: Family Medicine

## 2023-11-06 ENCOUNTER — Ambulatory Visit: Admitting: Family Medicine

## 2023-11-06 ENCOUNTER — Ambulatory Visit: Payer: Self-pay | Admitting: Family Medicine

## 2023-11-06 VITALS — BP 116/82 | HR 67 | Temp 97.6°F | Ht 68.5 in | Wt 252.0 lb

## 2023-11-06 DIAGNOSIS — N183 Chronic kidney disease, stage 3 unspecified: Secondary | ICD-10-CM

## 2023-11-06 DIAGNOSIS — G8929 Other chronic pain: Secondary | ICD-10-CM

## 2023-11-06 DIAGNOSIS — Z7984 Long term (current) use of oral hypoglycemic drugs: Secondary | ICD-10-CM

## 2023-11-06 DIAGNOSIS — N1831 Chronic kidney disease, stage 3a: Secondary | ICD-10-CM | POA: Diagnosis not present

## 2023-11-06 DIAGNOSIS — H6123 Impacted cerumen, bilateral: Secondary | ICD-10-CM

## 2023-11-06 DIAGNOSIS — E785 Hyperlipidemia, unspecified: Secondary | ICD-10-CM

## 2023-11-06 DIAGNOSIS — D649 Anemia, unspecified: Secondary | ICD-10-CM | POA: Diagnosis not present

## 2023-11-06 DIAGNOSIS — E1122 Type 2 diabetes mellitus with diabetic chronic kidney disease: Secondary | ICD-10-CM

## 2023-11-06 DIAGNOSIS — E559 Vitamin D deficiency, unspecified: Secondary | ICD-10-CM

## 2023-11-06 DIAGNOSIS — A048 Other specified bacterial intestinal infections: Secondary | ICD-10-CM

## 2023-11-06 DIAGNOSIS — E1169 Type 2 diabetes mellitus with other specified complication: Secondary | ICD-10-CM | POA: Diagnosis not present

## 2023-11-06 DIAGNOSIS — M25512 Pain in left shoulder: Secondary | ICD-10-CM

## 2023-11-06 LAB — CBC WITH DIFFERENTIAL/PLATELET
Basophils Absolute: 0 K/uL (ref 0.0–0.1)
Basophils Relative: 0.7 % (ref 0.0–3.0)
Eosinophils Absolute: 0.2 K/uL (ref 0.0–0.7)
Eosinophils Relative: 3.3 % (ref 0.0–5.0)
HCT: 39 % (ref 39.0–52.0)
Hemoglobin: 12.6 g/dL — ABNORMAL LOW (ref 13.0–17.0)
Lymphocytes Relative: 40.7 % (ref 12.0–46.0)
Lymphs Abs: 2 K/uL (ref 0.7–4.0)
MCHC: 32.4 g/dL (ref 30.0–36.0)
MCV: 87 fl (ref 78.0–100.0)
Monocytes Absolute: 0.4 K/uL (ref 0.1–1.0)
Monocytes Relative: 8.7 % (ref 3.0–12.0)
Neutro Abs: 2.3 K/uL (ref 1.4–7.7)
Neutrophils Relative %: 46.6 % (ref 43.0–77.0)
Platelets: 175 K/uL (ref 150.0–400.0)
RBC: 4.48 Mil/uL (ref 4.22–5.81)
RDW: 16.1 % — ABNORMAL HIGH (ref 11.5–15.5)
WBC: 5 K/uL (ref 4.0–10.5)

## 2023-11-06 LAB — TSH: TSH: 2.79 u[IU]/mL (ref 0.35–5.50)

## 2023-11-06 LAB — HEMOGLOBIN A1C: Hgb A1c MFr Bld: 6.6 % — ABNORMAL HIGH (ref 4.6–6.5)

## 2023-11-06 LAB — COMPREHENSIVE METABOLIC PANEL WITH GFR
ALT: 27 U/L (ref 0–53)
AST: 32 U/L (ref 0–37)
Albumin: 4 g/dL (ref 3.5–5.2)
Alkaline Phosphatase: 49 U/L (ref 39–117)
BUN: 16 mg/dL (ref 6–23)
CO2: 31 meq/L (ref 19–32)
Calcium: 9.5 mg/dL (ref 8.4–10.5)
Chloride: 105 meq/L (ref 96–112)
Creatinine, Ser: 1.42 mg/dL (ref 0.40–1.50)
GFR: 50.96 mL/min — ABNORMAL LOW (ref >60.00)
Glucose, Bld: 103 mg/dL — ABNORMAL HIGH (ref 70–99)
Potassium: 4.1 meq/L (ref 3.5–5.1)
Sodium: 142 meq/L (ref 135–145)
Total Bilirubin: 0.6 mg/dL (ref 0.2–1.2)
Total Protein: 7.3 g/dL (ref 6.0–8.3)

## 2023-11-06 LAB — LIPID PANEL
Cholesterol: 116 mg/dL (ref 0–200)
HDL: 42.9 mg/dL (ref >39.00)
LDL Cholesterol: 52 mg/dL (ref 0–99)
NonHDL: 73.32
Total CHOL/HDL Ratio: 3
Triglycerides: 106 mg/dL (ref 0.0–149.0)
VLDL: 21.2 mg/dL (ref 0.0–40.0)

## 2023-11-06 LAB — FOLATE: Folate: 21.1 ng/mL (ref >5.9)

## 2023-11-06 LAB — FERRITIN: Ferritin: 96.6 ng/mL (ref 22.0–322.0)

## 2023-11-06 LAB — VITAMIN D 25 HYDROXY (VIT D DEFICIENCY, FRACTURES): VITD: 50.61 ng/mL (ref 30.00–100.00)

## 2023-11-06 LAB — T4, FREE: Free T4: 0.86 ng/dL (ref 0.60–1.60)

## 2023-11-06 LAB — PHOSPHORUS: Phosphorus: 3.4 mg/dL (ref 2.3–4.6)

## 2023-11-06 LAB — VITAMIN B12: Vitamin B-12: 377 pg/mL (ref 211–911)

## 2023-11-06 MED ORDER — FARXIGA 10 MG PO TABS: 10.0000 mg | ORAL_TABLET | Freq: Every day | ORAL | 2 refills | Status: AC

## 2023-11-06 MED ORDER — MELOXICAM 15 MG PO TABS: 15.0000 mg | ORAL_TABLET | Freq: Every day | ORAL | 0 refills | Status: AC

## 2023-11-06 NOTE — Patient Instructions (Addendum)
 Please go for labs before you leave.  Increase your water intake to help your kidney function.  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  Cut back on sugary beverages, sugar and carbohydrates in your diet.  I will be in touch with your results.  Take the medication I prescribed for your shoulder pain, meloxicam  with food for only 2 weeks.  You may also use ice, heat or topical pain medicines.  If your shoulder does not improve in the next 2 to 4 weeks, I recommend that you reach out and schedule with your orthopedist.  Remember to reach out to your gastroenterologist once you complete the treatment for the H. pylori infection.  They would like for you to do a stool test to make sure the infection has resolved.

## 2023-11-06 NOTE — Progress Notes (Signed)
 Subjective:     Patient ID: Gabriel Livingston, male    DOB: 1955-08-13, 68 y.o.   MRN: [id number]  Chief Complaint  Patient presents with   Medical Management of Chronic Issues    Fasting 3 month f/u  Left shoulder for last 6 months have been giving him issues, some have said rotator cuff but he doesn't think so. Gives pain whenever he is sitting or lays on that side, hasn't taken anything for it in the past.    HPI  Discussed the use of AI scribe software for clinical note transcription with the patient, who gave verbal consent to proceed.  History of Present Illness Gabriel Livingston is a 68 year old male with hypertension, hyperlipidemia, diabetes, and chronic kidney disease who presents for follow-up on chronic health conditions and left shoulder pain.  Left shoulder pain - Pain present for six months, located near the left shoulder blade - Worsens throughout the day - Limits arm movement - No history of trauma or injury - No use of medications for pain - No numbness, tingling, or weakness in the hand  Hypertension - Managed with antihypertensive medication taken every other day - Good blood pressure readings during office visits - No home blood pressure monitoring - No chest pain, dizziness, or shortness of breath  Type 2 diabetes mellitus - Managed with metformin  twice daily - Monitors blood sugar levels - Drinks mango tea, believed to be low in sugar  Chronic kidney disease - Most recent GFR was 51.9 on Aug 29, 2023 - Prefers mango tea over water - No NSAID use  Hyperlipidemia - Takes rosuvastatin  every other day - Due for blood work to assess cholesterol levels  Gastrointestinal symptoms and anemia - Recent evaluation by gastroenterology for blood in stool and anemia - Diagnosed with H. pylori infection - Currently on second week of a 14-day antibiotic regimen  Left ear hearing loss - Difficulty hearing from the left ear - Attributed to excessive  earwax from Q-tip use - No nasal congestion, sore throat, fever, or chills      Health Maintenance Due  Topic Date Due   Pneumococcal Vaccine: 50+ Years (1 of 2 - PCV) Never done   INFLUENZA VACCINE  11/01/2023    Past Medical History:  Diagnosis Date   Anemia    CKD (chronic kidney disease) stage 3, GFR 30-59 ml/min (HCC)    Diabetes mellitus without complication (HCC)    ED (erectile dysfunction)    Enlarged prostate    Hyperlipidemia    Hypertension    Hypogonadism male    Other abnormal glucose    Overactive bladder    Pre-diabetes    Sleep apnea    mild, no cpap   Unspecified vitamin D  deficiency     Past Surgical History:  Procedure Laterality Date   COLONOSCOPY  2015   hx polyp   JOINT REPLACEMENT     POLYPECTOMY     colon polyps    Family History  Problem Relation Age of Onset   Heart disease Mother    Hyperlipidemia Mother    Hypertension Mother    Cancer Father        lung, prostate   Cancer Sister    Breast cancer Sister    Colon cancer Neg Hx    Rectal cancer Neg Hx    Stomach cancer Neg Hx    Esophageal cancer Neg Hx     Social History   Socioeconomic History  Marital status: Married    Spouse name: Not on file   Number of children: Not on file   Years of education: Not on file   Highest education level: Associate degree: occupational, Scientist, product/process development, or vocational program  Occupational History   Not on file  Tobacco Use   Smoking status: Never   Smokeless tobacco: Never  Vaping Use   Vaping status: Never Used  Substance and Sexual Activity   Alcohol use: No   Drug use: No   Sexual activity: Not Currently  Other Topics Concern   Not on file  Social History Narrative   Married   Social Drivers of Health   Financial Resource Strain: Low Risk  (11/04/2023)   Overall Financial Resource Strain (CARDIA)    Difficulty of Paying Living Expenses: Not hard at all  Food Insecurity: No Food Insecurity (11/04/2023)   Hunger Vital Sign     Worried About Running Out of Food in the Last Year: Never true    Ran Out of Food in the Last Year: Never true  Transportation Needs: No Transportation Needs (11/04/2023)   PRAPARE - Administrator, Civil Service (Medical): No    Lack of Transportation (Non-Medical): No  Physical Activity: Insufficiently Active (11/04/2023)   Exercise Vital Sign    Days of Exercise per Week: 3 days    Minutes of Exercise per Session: 40 min  Stress: No Stress Concern Present (11/04/2023)   [employer] of Occupational Health - Occupational Stress Questionnaire    Feeling of Stress: Not at all  Social Connections: Socially Integrated (11/04/2023)   Social Connection and Isolation Panel    Frequency of Communication with Friends and Family: More than three times a week    Frequency of Social Gatherings with Friends and Family: Once a week    Attends Religious Services: More than 4 times per year    Active Member of [employer] or Organizations: Yes    Attends Engineer, structural: More than 4 times per year    Marital Status: Married  Catering manager Violence: Not At Risk (10/01/2023)   Humiliation, Afraid, Rape, and Kick questionnaire    Fear of Current or Ex-Partner: No    Emotionally Abused: No    Physically Abused: No    Sexually Abused: No    Outpatient Medications Prior to Visit  Medication Sig Dispense Refill   bisoprolol -hydrochlorothiazide  (ZIAC ) 10-6.25 MG tablet TAKE 1 TABLET BY MOUTH DAILY FOR BLOOD PRESSURE 100 tablet 0   doxazosin  (CARDURA ) 8 MG tablet Take 1 tablet daily at Bedtime 90 tablet 1   Ferrous Sulfate (IRON PO) Take 65 mg by mouth daily.     finasteride (PROSCAR) 5 MG tablet Take 5 mg by mouth daily.     metFORMIN  (GLUCOPHAGE -XR) 500 MG 24 hr tablet Take 1 to 2 tablets 2 x /day with Meals for Diabetes. 360 tablet 1   omeprazole  (PRILOSEC) 20 MG capsule Take 1 capsule (20 mg total) by mouth 2 (two) times daily for 14 days. 28 capsule 0   rosuvastatin  (CRESTOR ) 40  MG tablet Take  1 tablet  Daily   for Cholesterol                                                                         /  TAKE                                                    BY                                                   MOUTH 90 tablet 3   tadalafil  (CIALIS ) 20 MG tablet Take 1/2 to 1 tablet every 2 to 3 days as needed for XXXX 30 tablet 3   zinc gluconate 50 MG tablet Take 50 mg by mouth daily.     Na Sulfate-K Sulfate-Mg Sulfate concentrate (SUPREP) 17.5-3.13-1.6 GM/177ML SOLN Use as directed; may use generic; goodrx card if insurance will not cover generic 354 mL 0   No facility-administered medications prior to visit.    No Known Allergies  ROS     Objective:    Physical Exam Constitutional:      General: He is not in acute distress.    Appearance: He is not ill-appearing.  HENT:     Mouth/Throat:     Mouth: Mucous membranes are moist.     Pharynx: Oropharynx is clear.  Eyes:     Extraocular Movements: Extraocular movements intact.     Conjunctiva/sclera: Conjunctivae normal.  Cardiovascular:     Rate and Rhythm: Normal rate.  Pulmonary:     Effort: Pulmonary effort is normal.  Musculoskeletal:     Cervical back: Normal range of motion and neck supple.  Skin:    General: Skin is warm and dry.  Neurological:     General: No focal deficit present.     Mental Status: He is alert and oriented to person, place, and time.     Motor: No weakness.     Coordination: Coordination normal.     Gait: Gait normal.  Psychiatric:        Mood and Affect: Mood normal.        Behavior: Behavior normal.        Thought Content: Thought content normal.      BP 116/82 (BP Location: Left Arm, Patient Position: Sitting)   Pulse 67   Temp 97.6 F (36.4 C) (Temporal)   Ht 5' 8.5 (1.74 m)   Wt 252 lb (114.3 kg)   SpO2 97%   BMI 37.76 kg/m  Wt Readings from Last 3 Encounters:  11/06/23 252 lb (114.3 kg)  10/08/23 249 lb  (112.9 kg)  10/01/23 245 lb (111.1 kg)   Physical Exam HEENT: Cerumen impaction in both ears. Ear canals clear post ear lavage.  CHEST: Clear to auscultation bilaterally. CARDIOVASCULAR: Normal heart sounds. MUSCULOSKELETAL: Tenderness in left shoulder blade.      Assessment & Plan:   Problem List Items Addressed This Visit     CKD (chronic kidney disease) stage 3, GFR 30-59 ml/min (HCC)   Relevant Orders   CBC with Differential/Platelet (Completed)   Comprehensive metabolic panel with GFR (Completed)   Phosphorus (Completed)   Hyperlipidemia associated with type 2 diabetes mellitus (HCC)   Relevant Medications   FARXIGA  10 MG TABS tablet   Other Relevant Orders   Lipid panel (Completed)   Type 2 diabetes mellitus with stage 3a chronic kidney disease, without long-term current  use of insulin  (HCC) - Primary   Relevant Medications   FARXIGA  10 MG TABS tablet   Other Relevant Orders   CBC with Differential/Platelet (Completed)   Comprehensive metabolic panel with GFR (Completed)   TSH (Completed)   T4, free (Completed)   Hemoglobin A1c (Completed)   Vitamin D  deficiency   Relevant Orders   VITAMIN D  25 Hydroxy (Vit-D Deficiency, Fractures) (Completed)   Other Visit Diagnoses       Hearing loss due to cerumen impaction, bilateral         Chronic left shoulder pain       Relevant Medications   meloxicam  (MOBIC ) 15 MG tablet     Anemia, unspecified type       Relevant Orders   CBC with Differential/Platelet (Completed)   Ferritin (Completed)   Folate (Completed)   Vitamin B12 (Completed)     H. pylori infection          Assessment and Plan Assessment & Plan Left shoulder pain Chronic left shoulder pain for six months, primarily in the back of the shoulder blade. No known injury. Pain worsens throughout the day and is associated with difficulty lifting the arm. No numbness, tingling, or weakness in the hand. Pain does not disturb sleep. Possible muscle spasm  noted. - Prescribe anti-inflammatory medication for two weeks, to be taken with food - Recommend topical treatments such as Salonpas with lidocaine  - Advise scheduling an appointment with [facility] for further evaluation if no improvement - Consider ultrasound, x-ray, or MRI if range of motion is limited  Impacted cerumen, bilateral Bilateral ear wax impaction causing difficulty hearing, particularly in the left ear. Tympanic membranes not visible due to wax. - Perform ear wax removal by assistant  Chronic kidney disease with anemia Chronic kidney disease with a GFR of 51.9, indicating stage 3 CKD. Anemia may be related to CKD. Serum creatinine is borderline. Unaware of CKD diagnosis. Importance of hydration emphasized. Avoidance of NSAIDs advised. - Encourage increased water intake to support kidney function Prescribe Farxiga    Type 2 diabetes mellitus Type 2 diabetes managed with metformin , taken twice daily. Blood sugar levels to be monitored with upcoming lab work. - Order blood work to assess current blood sugar levels Prescribed Farxiga    Hypertension Hypertension managed with medication taken every other day. Blood pressure readings in the office are consistently normal. Does not monitor blood pressure at home. - Continue current blood pressure medication regimen - Encourage regular blood pressure monitoring at home  Hyperlipidemia Hyperlipidemia managed with rosuvastatin , taken every other day. Upcoming lab work to assess cholesterol levels. - Order blood work to assess current cholesterol levels  Helicobacter pylori infection, under treatment Currently undergoing treatment for H. pylori infection with antibiotics. In the second week of a 14-day course. Follow-up stool test required four weeks after completion of treatment to confirm eradication. - Complete current antibiotic regimen for H. pylori - Contact gastroenterologist's office to schedule H. pylori stool test four  weeks after treatment completion    I have discontinued Oisin D. Kochanowski's Na Sulfate-K Sulfate-Mg Sulfate concentrate. I am also having him start on meloxicam  and Farxiga . Additionally, I am having him maintain his zinc gluconate, Ferrous Sulfate (IRON PO), tadalafil , rosuvastatin , bisoprolol -hydrochlorothiazide , doxazosin , metFORMIN , omeprazole , and finasteride.  Meds ordered this encounter  Medications   meloxicam  (MOBIC ) 15 MG tablet    Sig: Take 1 tablet (15 mg total) by mouth daily.    Dispense:  14 tablet    Refill:  0  Supervising Provider:   ROLLENE NORRIS A [[id number]]   FARXIGA  10 MG TABS tablet    Sig: Take 1 tablet (10 mg total) by mouth daily before breakfast.    Dispense:  30 tablet    Refill:  2    Supervising Provider:   ROLLENE NORRIS A [[id number]]

## 2023-11-18 ENCOUNTER — Telehealth: Payer: Self-pay

## 2023-11-18 NOTE — Telephone Encounter (Signed)
 Copied from CRM #[id number]. Topic: Clinical - Prescription Issue >> Nov 18, 2023 10:34 AM Charolett L wrote: Reason for CRM: FARXIGA  10 MG TABS tablet  Patient is looking for a cheaper alternative for the above medication because its too expensive with or without insurance

## 2023-11-19 NOTE — Telephone Encounter (Signed)
 Called pt and advised there is no way of us  knowing which alternative would be cheaper so he would need to contact his insurance and let us  know. Pt verbalized understanding

## 2023-11-25 ENCOUNTER — Encounter: Payer: Self-pay | Admitting: *Deleted

## 2023-12-02 ENCOUNTER — Other Ambulatory Visit: Payer: Self-pay | Admitting: Family Medicine

## 2023-12-02 DIAGNOSIS — R350 Frequency of micturition: Secondary | ICD-10-CM

## 2023-12-28 ENCOUNTER — Other Ambulatory Visit: Payer: Self-pay | Admitting: Family

## 2023-12-28 DIAGNOSIS — I10 Essential (primary) hypertension: Secondary | ICD-10-CM

## 2023-12-31 ENCOUNTER — Ambulatory Visit: Payer: Medicare Other | Admitting: Nurse Practitioner

## 2024-02-03 ENCOUNTER — Other Ambulatory Visit: Payer: Self-pay | Admitting: Family Medicine

## 2024-02-03 DIAGNOSIS — E782 Mixed hyperlipidemia: Secondary | ICD-10-CM

## 2024-02-03 NOTE — Telephone Encounter (Signed)
 Copied from CRM #[id number]. Topic: Clinical - Medication Refill >> Feb 03, 2024  9:16 AM Ahlexyia S wrote: Medication: rosuvastatin  (CRESTOR ) 40 MG tablet  Has the patient contacted their pharmacy? Yes, pt was advised to contact clinic. (Agent: If no, request that the patient contact the pharmacy for the refill. If patient does not wish to contact the pharmacy document the reason why and proceed with request.) (Agent: If yes, when and what did the pharmacy advise?)  This is the patient's preferred pharmacy:  [facility] - [place], [place] - [address] [place] [address] Phone: [phone] Fax: [phone]  Is this the correct pharmacy for this prescription? Yes If no, delete pharmacy and type the correct one.   Has the prescription been filled recently? No  Is the patient out of the medication? Yes  Has the patient been seen for an appointment in the last year OR does the patient have an upcoming appointment? Yes  Can we respond through MyChart? No, prefers phone calls.  Agent: Please be advised that Rx refills may take up to 3 business days. We ask that you follow-up with your pharmacy.

## 2024-02-10 MED ORDER — ROSUVASTATIN CALCIUM 40 MG PO TABS: ORAL_TABLET | ORAL | 0 refills | Status: DC

## 2024-02-10 NOTE — Telephone Encounter (Unsigned)
 Copied from CRM #[id number]. Topic: Clinical - Prescription Issue >> Feb 10, 2024 10:47 AM Terri MATSU wrote: Reason for CRM: Patient put in a refill request for rosuvastatin  on 11/3 and it looks like he is all out of refills. Can Dr.Henson put in a new prescription or will she want to see him first? If so, can someone reach out to schedule or let him know what he needs to do. Callback number [phone]

## 2024-03-27 ENCOUNTER — Encounter: Payer: Medicare Other | Admitting: Internal Medicine

## 2024-03-31 ENCOUNTER — Encounter: Payer: Medicare Other | Admitting: Internal Medicine

## 2024-04-12 ENCOUNTER — Other Ambulatory Visit: Payer: Self-pay | Admitting: Family Medicine

## 2024-04-12 DIAGNOSIS — E782 Mixed hyperlipidemia: Secondary | ICD-10-CM

## 2024-10-01 ENCOUNTER — Ambulatory Visit

## 2024-11-03 ENCOUNTER — Ambulatory Visit
